# Patient Record
Sex: Male | Born: 1940 | Race: White | Hispanic: No | Marital: Married | Smoking: Former smoker
Health system: Southern US, Community
[De-identification: ages and names within clinical notes are randomized; demographics above are authoritative.]

## PROBLEM LIST (undated history)

## (undated) DIAGNOSIS — F319 Bipolar disorder, unspecified: Secondary | ICD-10-CM

## (undated) DIAGNOSIS — I739 Peripheral vascular disease, unspecified: Secondary | ICD-10-CM

## (undated) DIAGNOSIS — R7303 Prediabetes: Secondary | ICD-10-CM

## (undated) DIAGNOSIS — E785 Hyperlipidemia, unspecified: Secondary | ICD-10-CM

## (undated) DIAGNOSIS — N183 Chronic kidney disease, stage 3 unspecified: Secondary | ICD-10-CM

## (undated) HISTORY — PX: VASCULAR SURGERY: SHX849

---

## 2004-10-10 ENCOUNTER — Ambulatory Visit: Payer: Self-pay | Admitting: Surgery

## 2009-12-04 ENCOUNTER — Ambulatory Visit: Payer: Self-pay | Admitting: Vascular Surgery

## 2009-12-04 IMAGING — XA IR VASCULAR PROCEDURE
14 of 22 series · 15 of 24 positions shown · IV contrast (IODINE)
Comparison: none

[Series 1: aorta · 1 of 2 slices shown (1 of 2)]
[im 1/2]
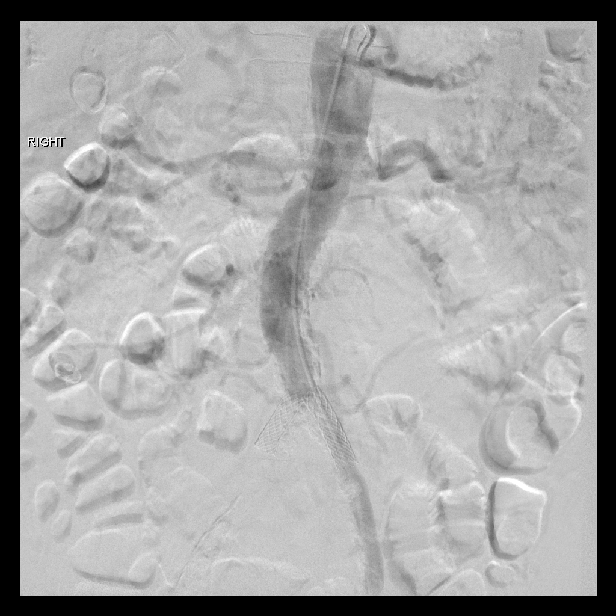

[Series 3: aorta · 1 of 2 slices shown (2 of 2)]
[im 1/2]
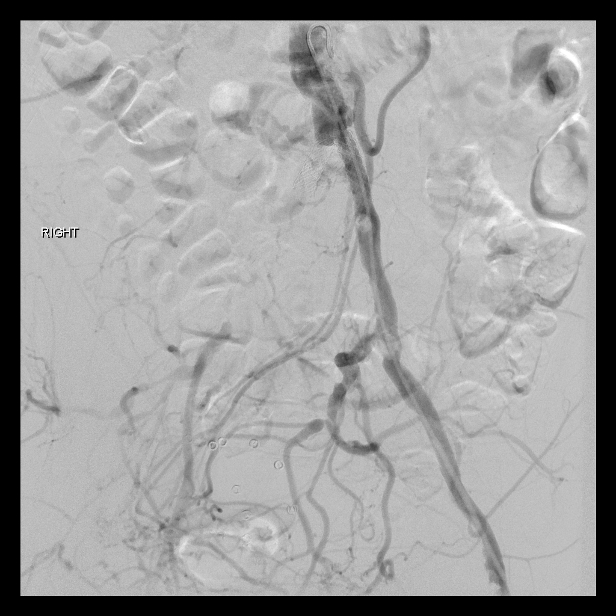

[Series 5: sfa · 1 of 2 slices shown (1 of 2)]
[im 1/2]
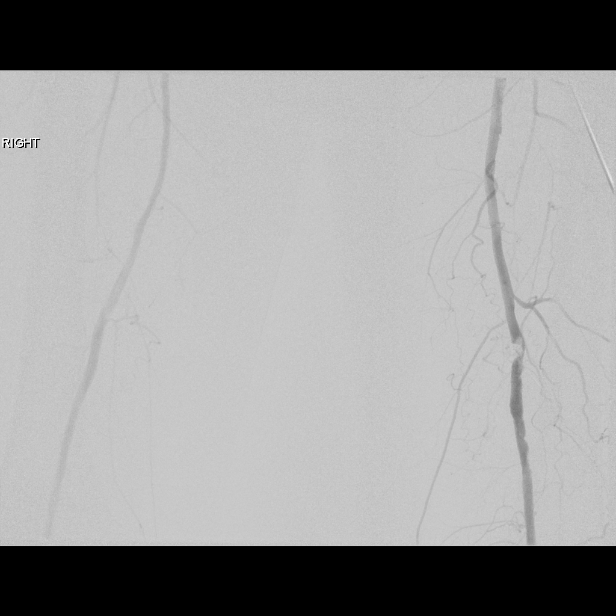

[Series 6: sfa · 1 of 2 slices shown (2 of 2)]
[im 1/2]
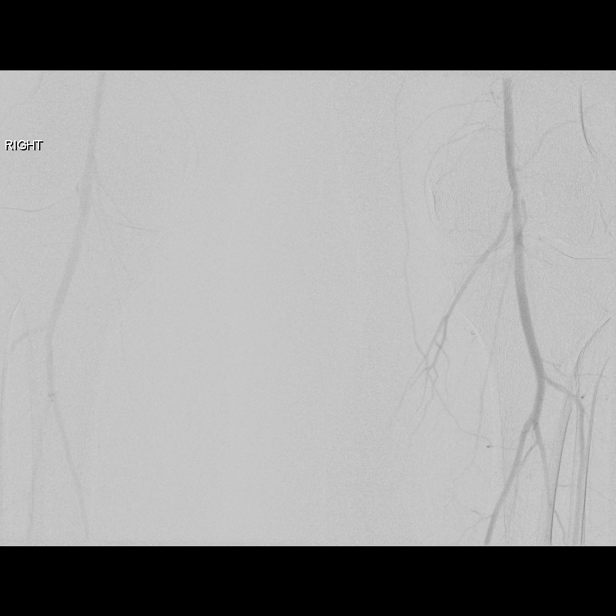

[Series 8: iliacs · 1 of 2 slices shown (1 of 8)]
[im 1/2]
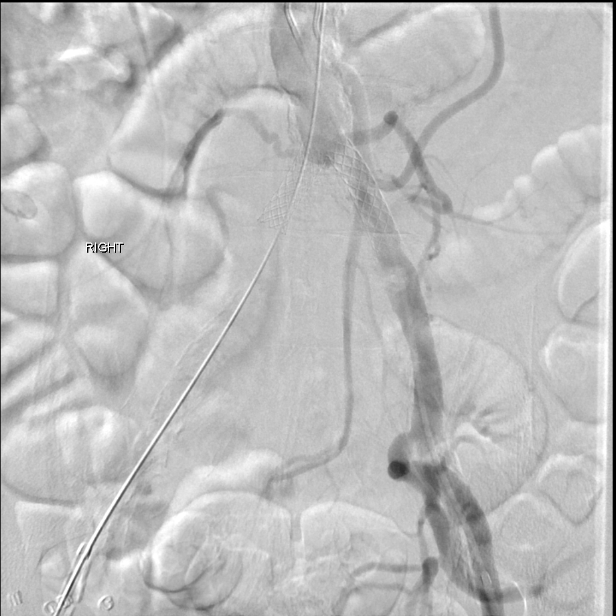

[Series 9: iliacs · 1 of 2 slices shown (2 of 8)]
[im 1/2]
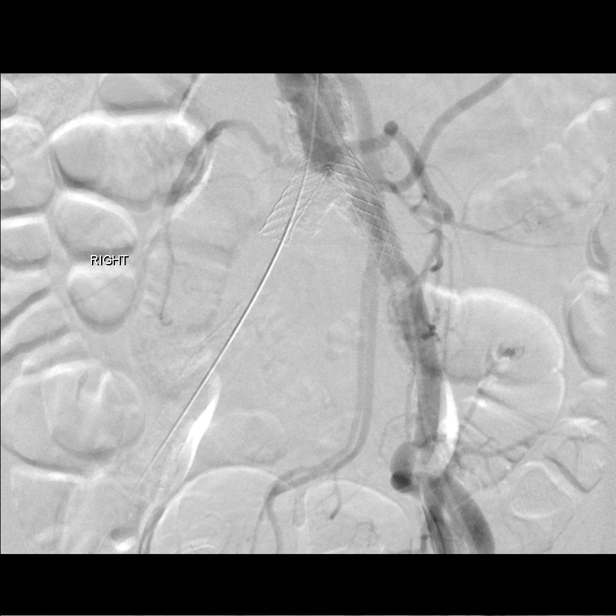

[Series 11: fl  angio · 1 of 1 slices shown (1 of 2)]
[im 1/1]
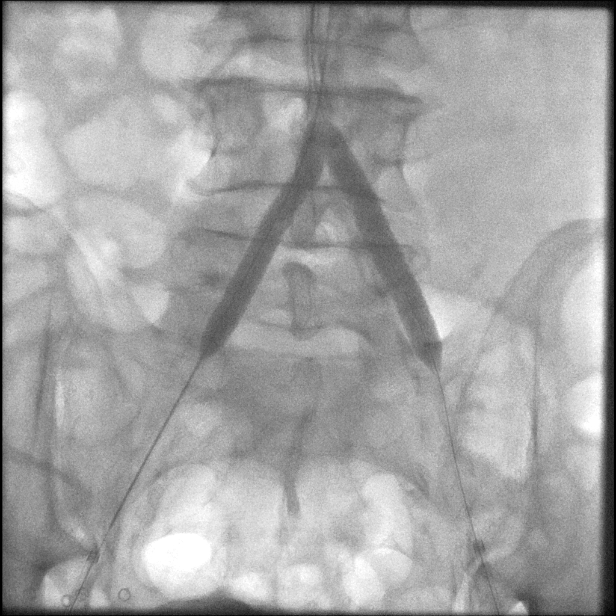

[Series 12: iliacs · 1 of 3 slices shown (3 of 8)]
[im 3/3]
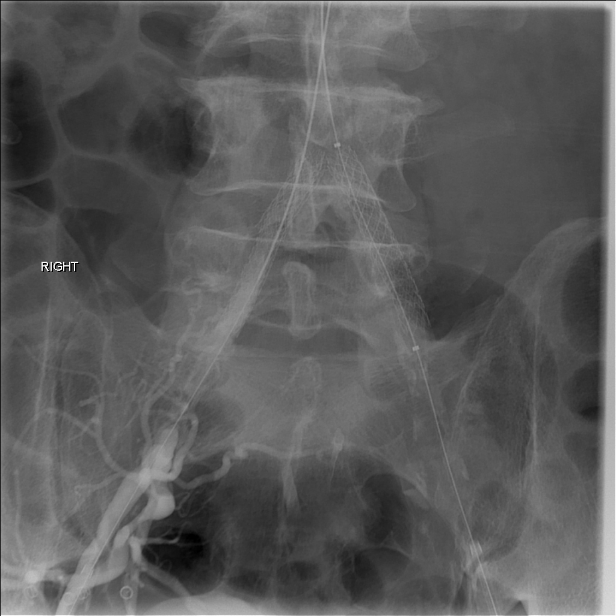

[Series 13: iliacs · 1 of 2 slices shown (4 of 8)]
[im 1/2]
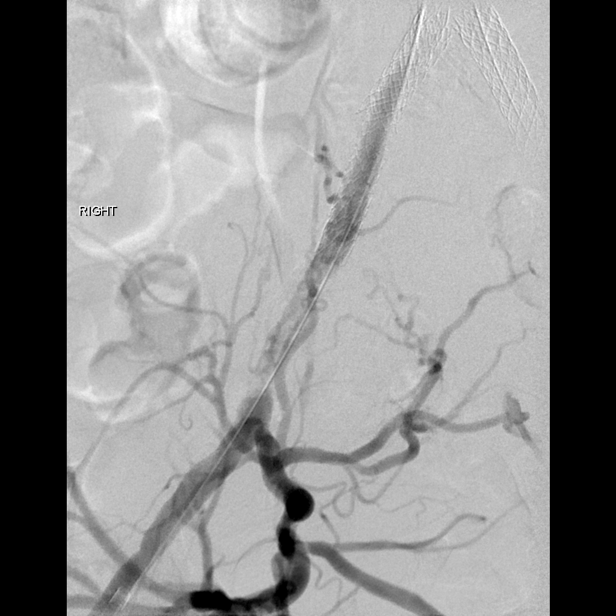

[Series 15: iliacs · 1 of 2 slices shown (5 of 8)]
[im 1/2]
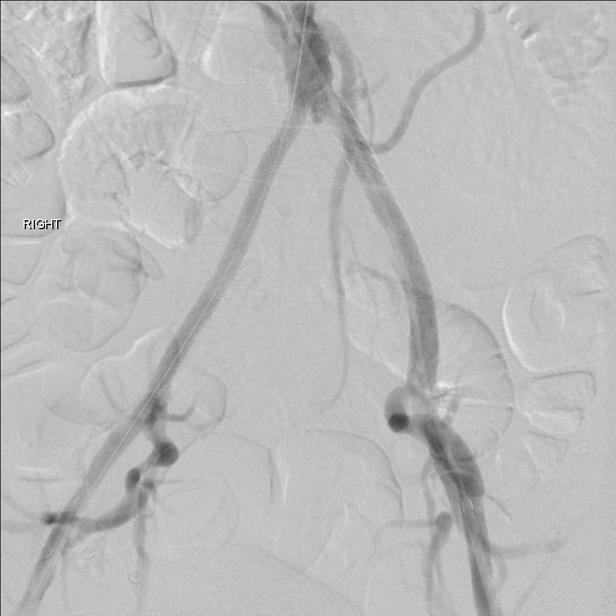

[Series 16: fl  angio · 1 of 1 slices shown (2 of 2)]
[im 1/1]
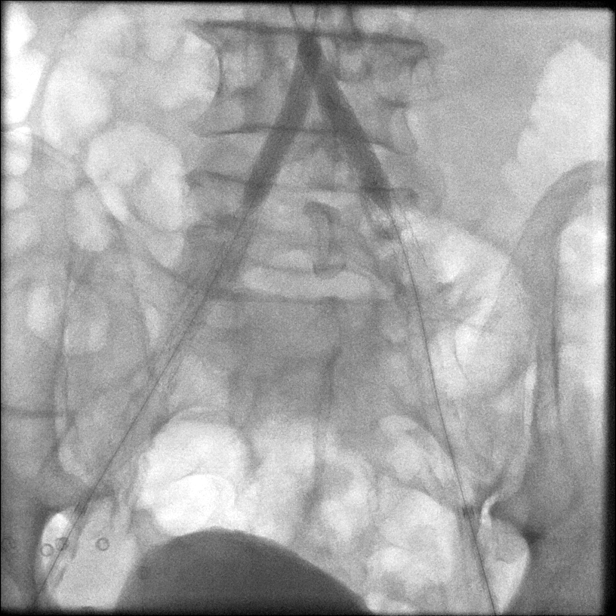

[Series 18: iliacs · 1 of 2 slices shown (6 of 8)]
[im 1/2]
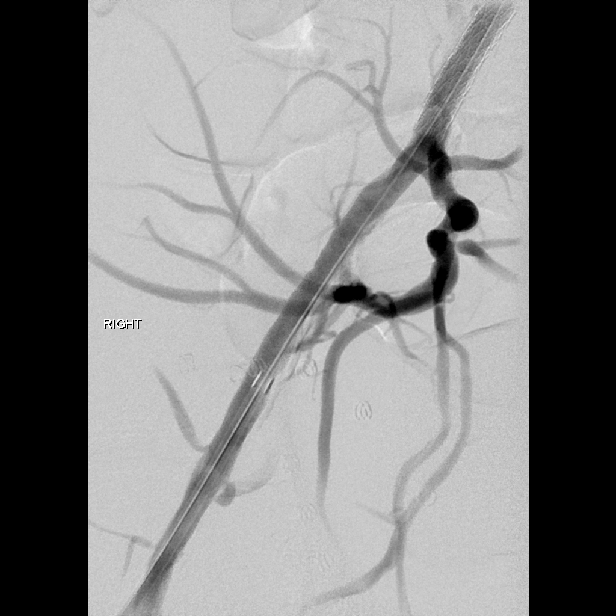

[Series 20: iliacs · 2 of 3 slices shown (7 of 8)]
[im 1/3]
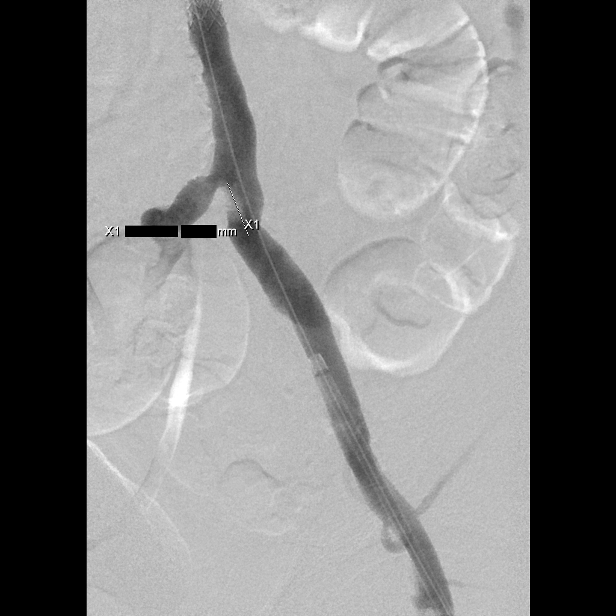
[im 3/3]
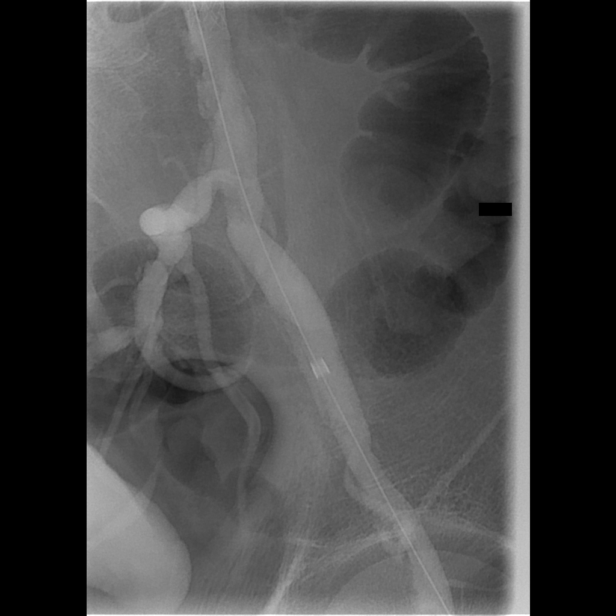

[Series 22: iliacs · 1 of 2 slices shown (8 of 8)]
[im 1/2]
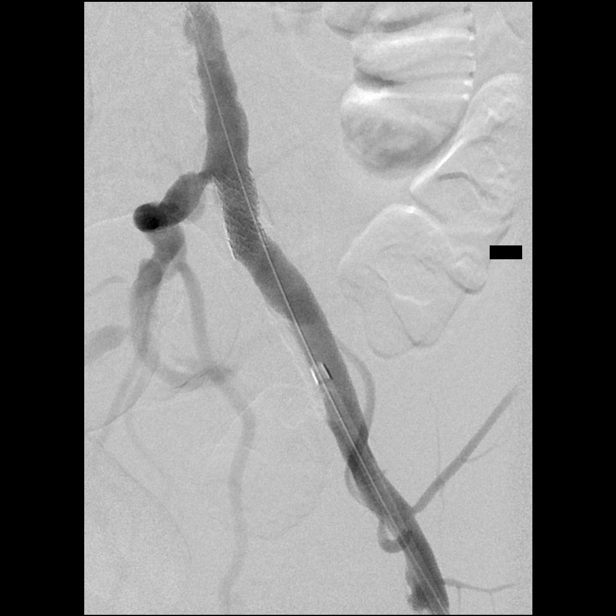

[15 of 24 positions shown; findings below may reference images not displayed]

IMAGES IMPORTED FROM THE [employer] WORKFLOW SYSTEM
NO DICTATION FOR STUDY

## 2009-12-31 ENCOUNTER — Ambulatory Visit: Payer: Self-pay | Admitting: Vascular Surgery

## 2009-12-31 IMAGING — XA IR VASCULAR PROCEDURE
11 series · 15 of 18 positions shown · IV contrast (IODINE)
Comparison: none

[Series 1: aorta · 2 of 2 slices shown (1 of 7)]
[im 1/2]
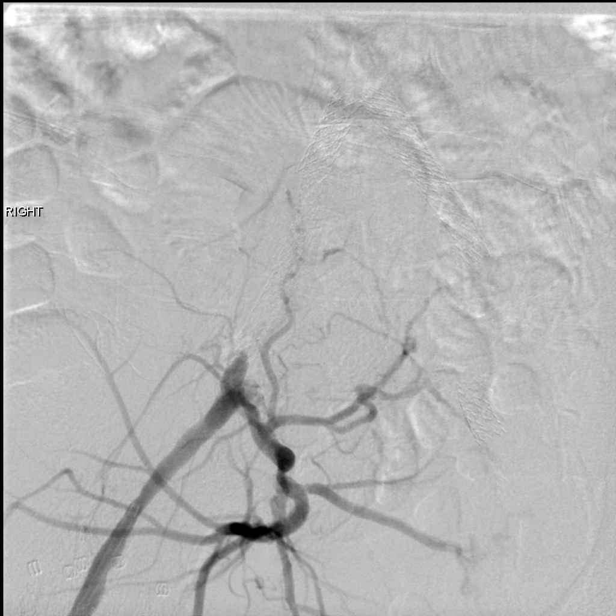
[im 2/2]
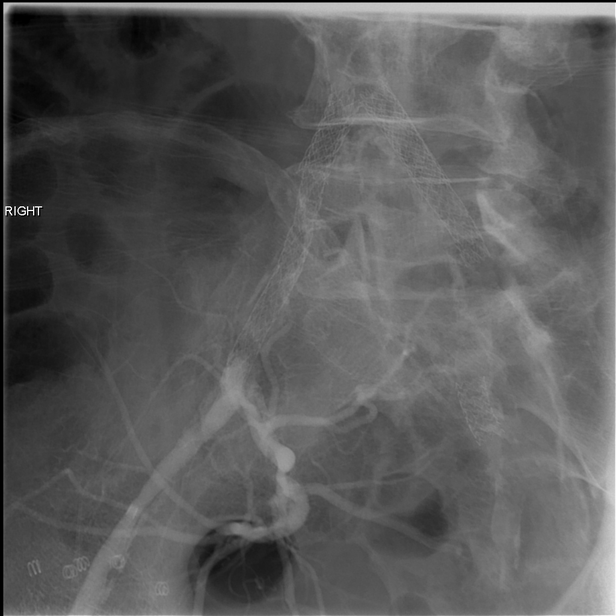

[Series 2: aorta · 1 of 2 slices shown (2 of 7)]
[im 2/2]
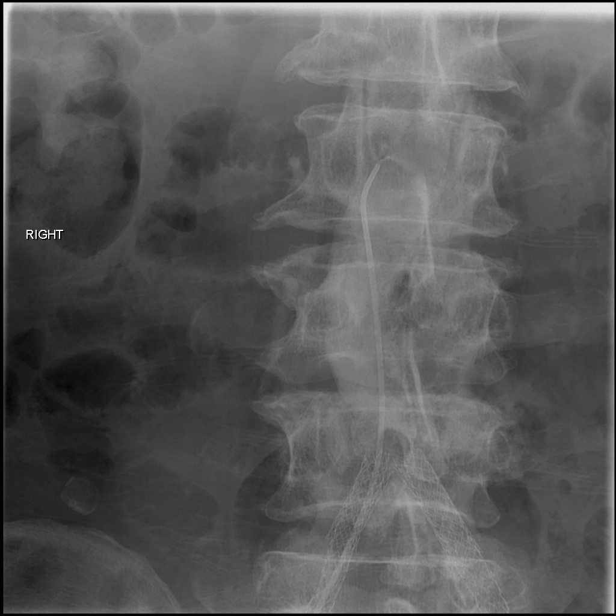

[Series 3: fl  angio · 1 of 1 slices shown (1 of 4)]
[im 1/1]
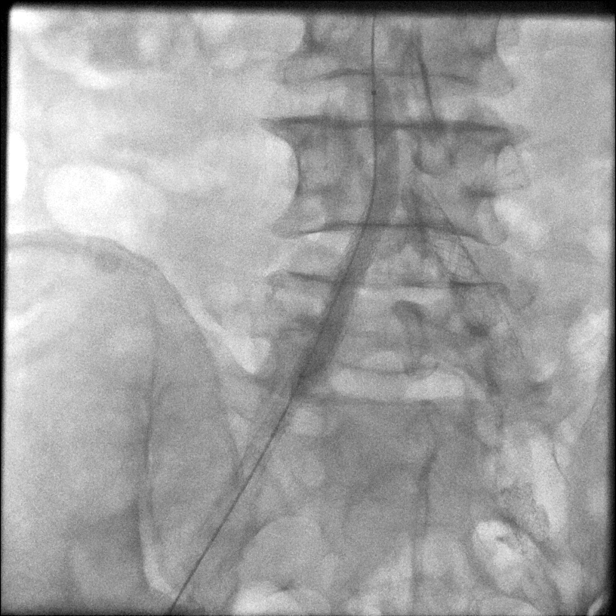

[Series 4: fl  angio · 1 of 1 slices shown (2 of 4)]
[im 1/1]
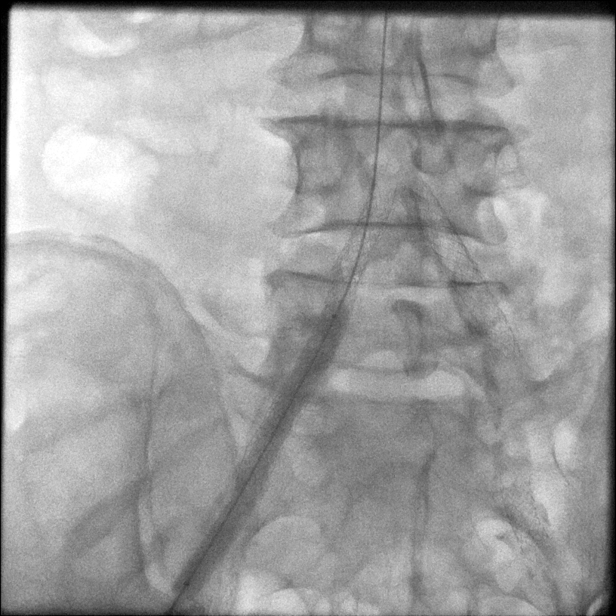

[Series 5: aorta · 2 of 2 slices shown (3 of 7)]
[im 1/2]
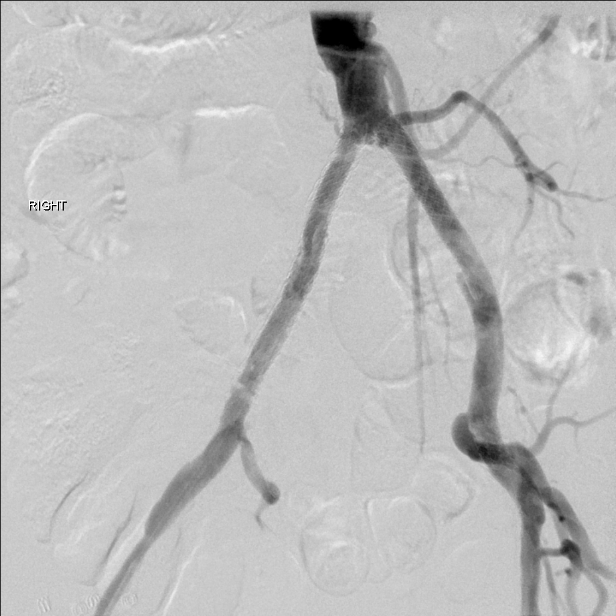
[im 2/2]
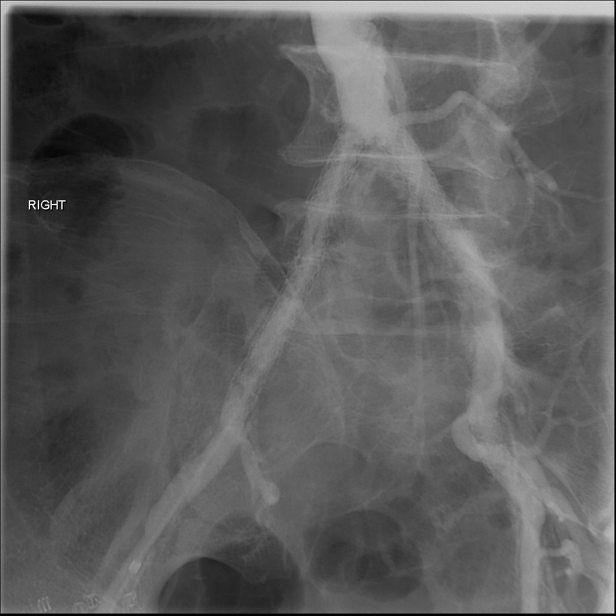

[Series 6: aorta · 1 of 2 slices shown (4 of 7)]
[im 2/2]
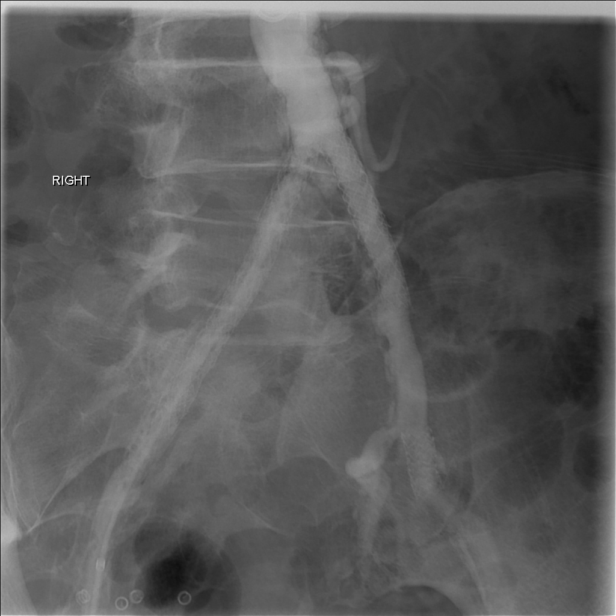

[Series 7: fl  angio · 1 of 1 slices shown (3 of 4)]
[im 1/1]
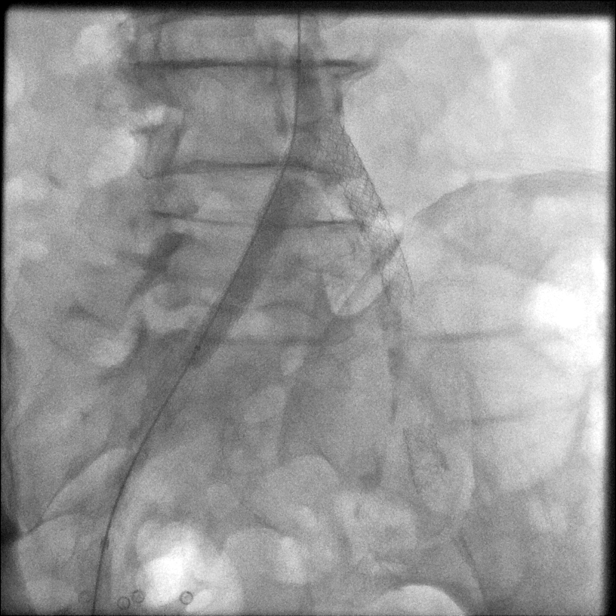

[Series 8: fl  angio · 1 of 1 slices shown (4 of 4)]
[im 1/1]
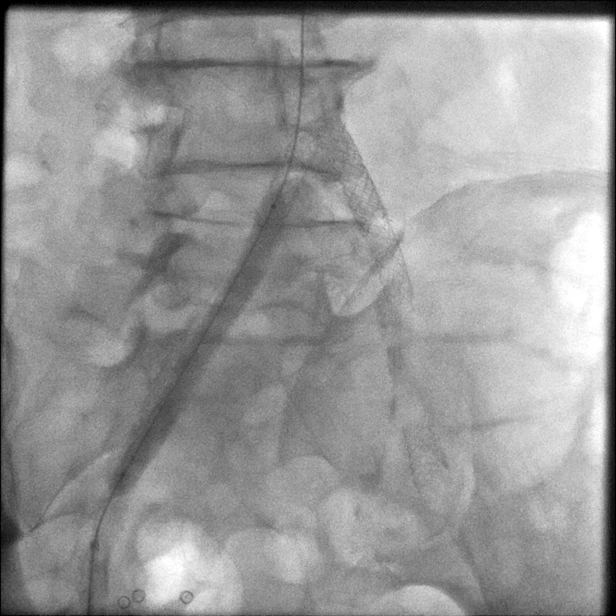

[Series 9: aorta · 2 of 2 slices shown (5 of 7)]
[im 1/2]
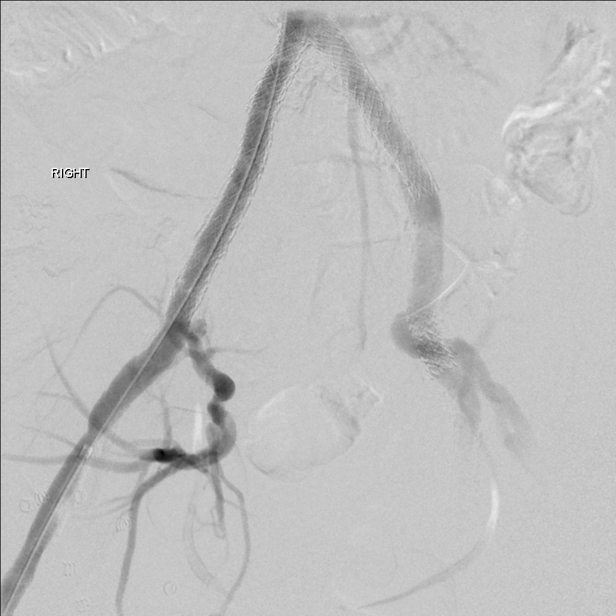
[im 2/2]
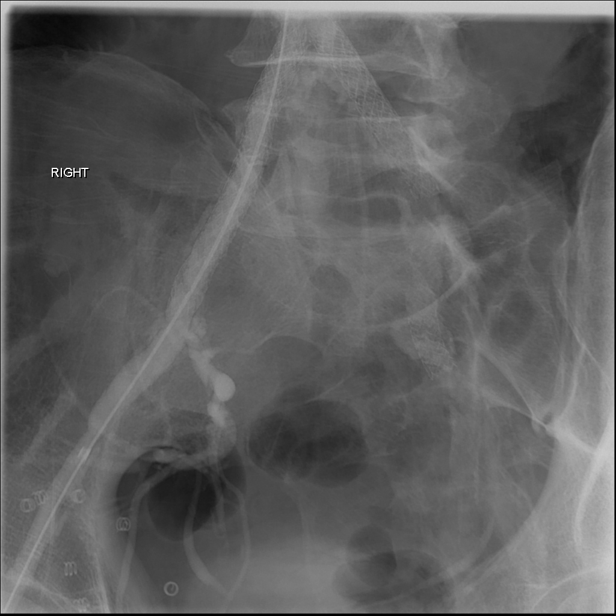

[Series 10: aorta · 1 of 2 slices shown (6 of 7)]
[im 2/2]
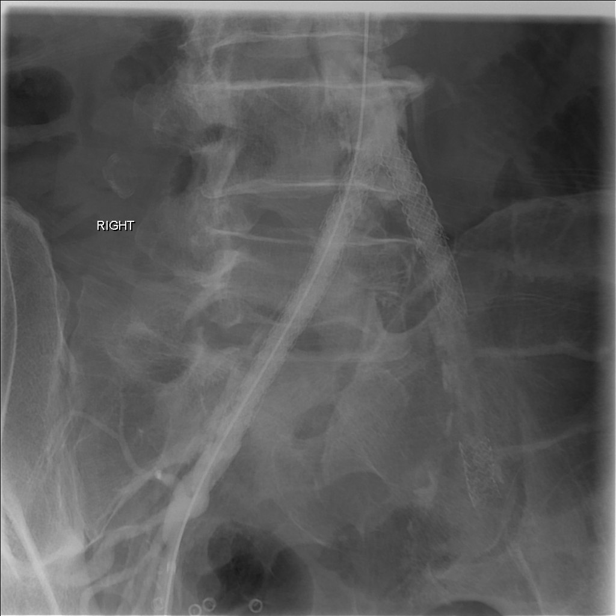

[Series 11: aorta · 2 of 2 slices shown (7 of 7)]
[im 1/2]
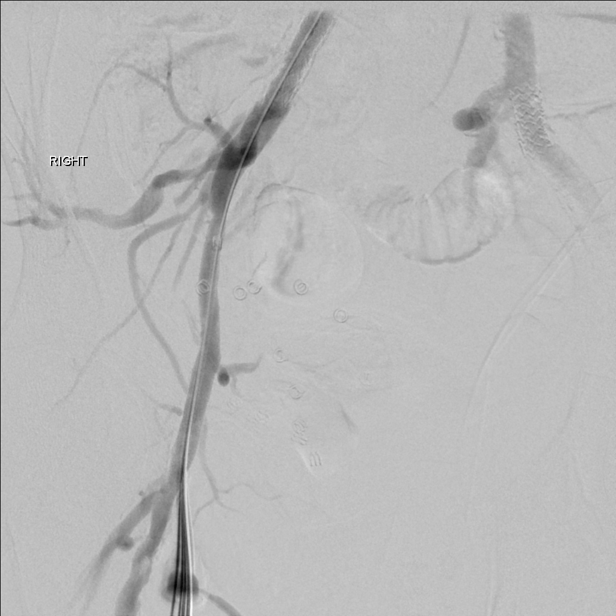
[im 2/2]
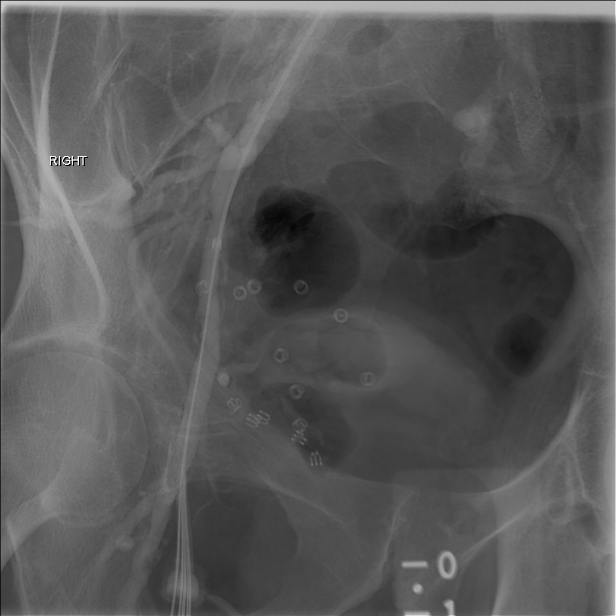

[15 of 18 positions shown; findings below may reference images not displayed]

IMAGES IMPORTED FROM THE [employer] WORKFLOW SYSTEM
NO DICTATION FOR STUDY

## 2010-01-09 ENCOUNTER — Ambulatory Visit: Payer: Self-pay | Admitting: Vascular Surgery

## 2010-01-09 IMAGING — CR DG CHEST 2V
1 series · 3 of 3 positions shown · non-contrast
Comparison: none

REASON FOR EXAM: dyspnea,smoker,pvd,stent
COMMENTS:

[Series 1: view not recorded · 0.17mm/px · 3 of 3 slices shown]
[im 1/3]
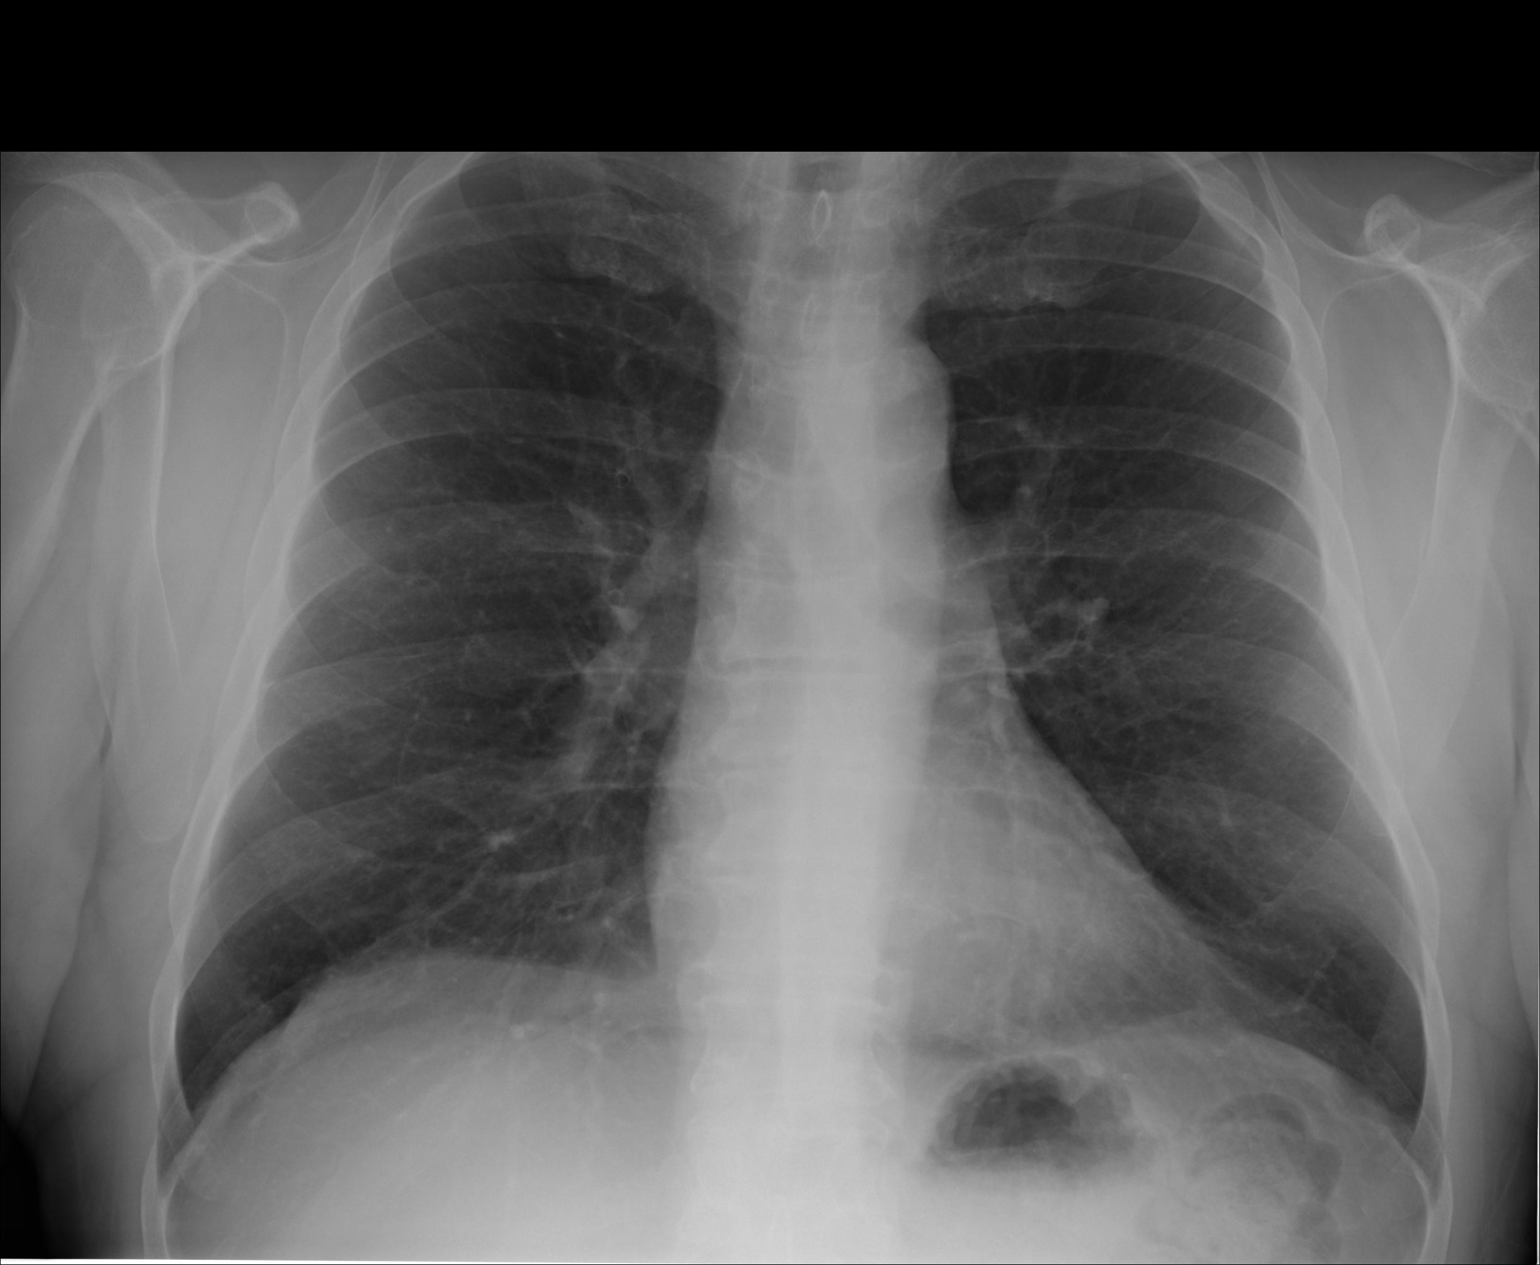
[im 2/3]
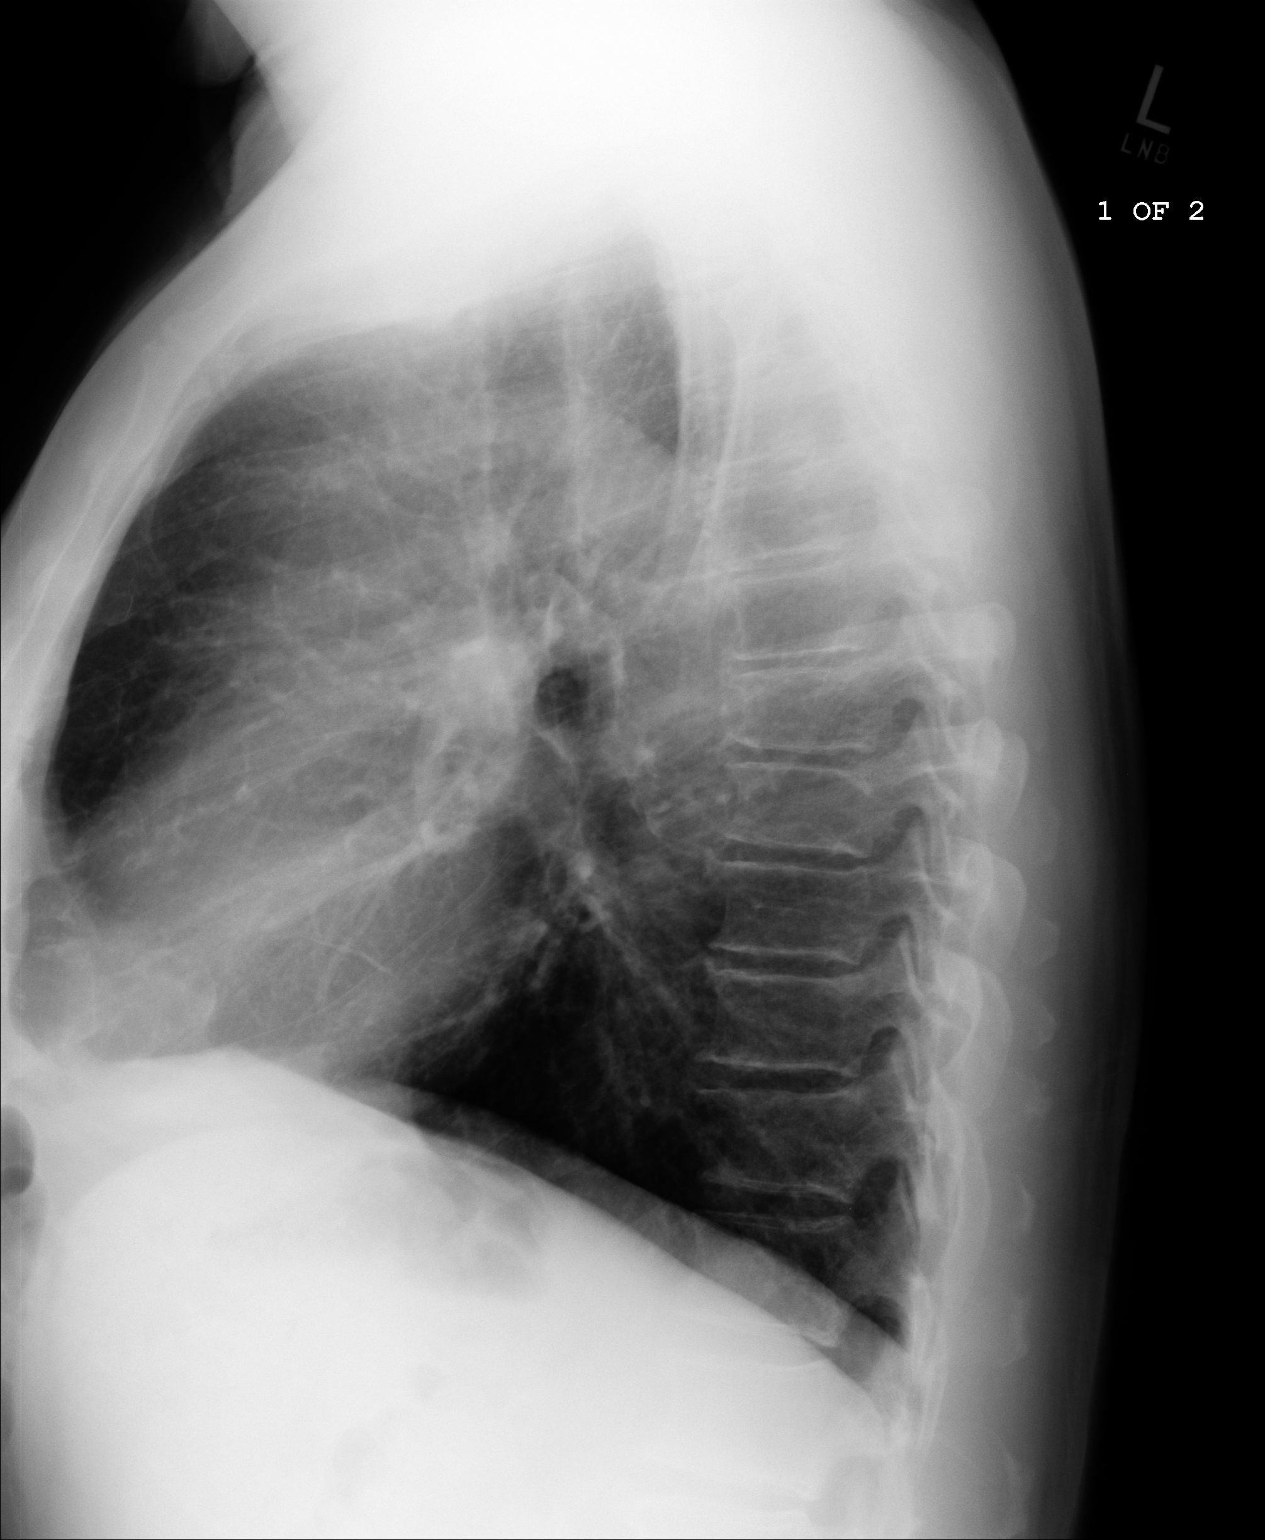
[im 3/3]
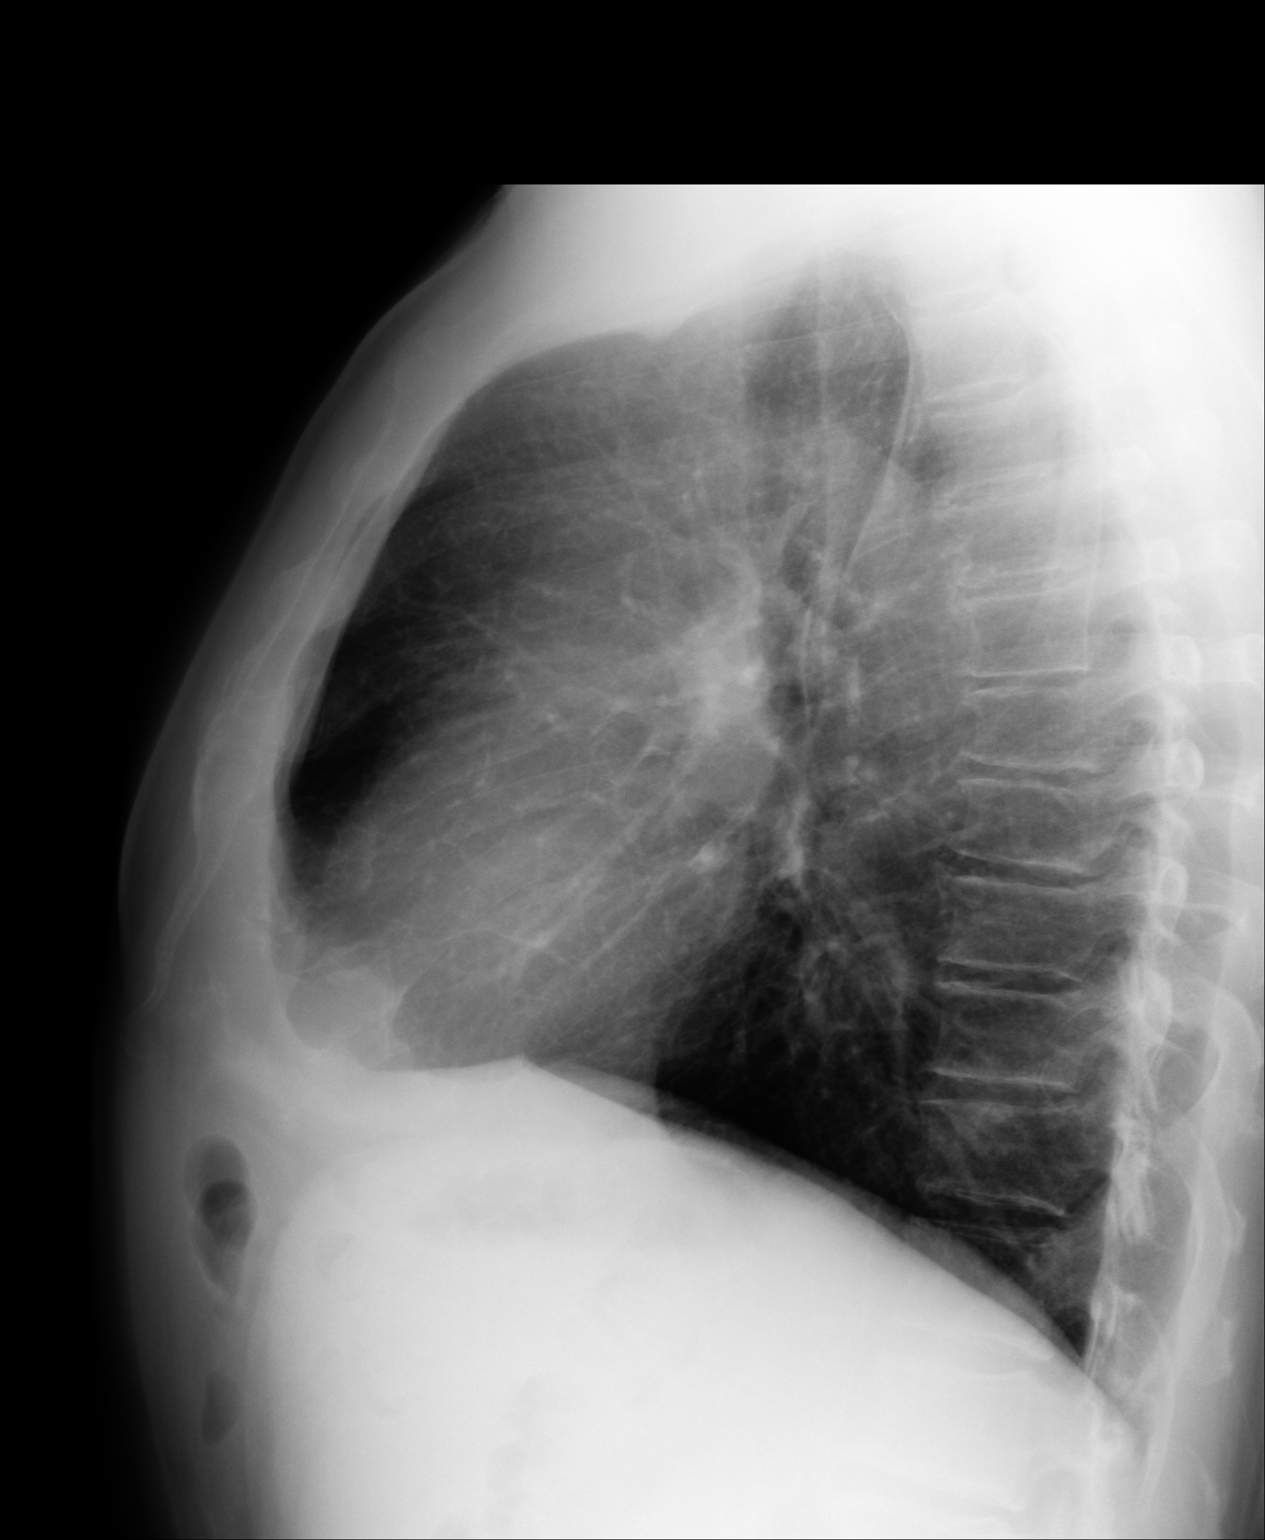

[3 of 3 positions shown; findings below may reference images not displayed]

PROCEDURE:     DXR - DXR CHEST PA (OR AP) AND LATERAL  - [DATE] [DATE]

RESULT:     The lung fields are clear. No pneumonia, pneumothorax or pleural
effusion is seen. The heart size is normal. In the lateral view, the chest
appears mildly hyperexpanded consistent with an element of COPD. The osseous
structures are normal in appearance.
IMPRESSION: 1. The lung fields are clear.
2. The chest appears mildly hyperexpanded.
3. Incidental note is made of an old healed fracture of the left clavicle.

## 2010-01-16 ENCOUNTER — Inpatient Hospital Stay: Payer: Self-pay | Admitting: Vascular Surgery

## 2010-03-20 ENCOUNTER — Emergency Department (HOSPITAL_COMMUNITY)
Admission: EM | Admit: 2010-03-20 | Discharge: 2010-03-20 | Payer: Self-pay | Source: Home / Self Care | Admitting: Emergency Medicine
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
## 2011-01-14 ENCOUNTER — Ambulatory Visit: Payer: Self-pay | Admitting: Gastroenterology

## 2014-06-26 DIAGNOSIS — I70229 Atherosclerosis of native arteries of extremities with rest pain, unspecified extremity: Secondary | ICD-10-CM | POA: Insufficient documentation

## 2014-06-26 DIAGNOSIS — E782 Mixed hyperlipidemia: Secondary | ICD-10-CM | POA: Insufficient documentation

## 2014-06-26 DIAGNOSIS — I739 Peripheral vascular disease, unspecified: Secondary | ICD-10-CM | POA: Insufficient documentation

## 2016-10-06 DIAGNOSIS — F319 Bipolar disorder, unspecified: Secondary | ICD-10-CM | POA: Insufficient documentation

## 2016-10-06 DIAGNOSIS — D369 Benign neoplasm, unspecified site: Secondary | ICD-10-CM | POA: Insufficient documentation

## 2017-03-13 DIAGNOSIS — F320 Major depressive disorder, single episode, mild: Secondary | ICD-10-CM | POA: Insufficient documentation

## 2017-04-07 DIAGNOSIS — Z0000 Encounter for general adult medical examination without abnormal findings: Secondary | ICD-10-CM | POA: Insufficient documentation

## 2018-04-14 DIAGNOSIS — J432 Centrilobular emphysema: Secondary | ICD-10-CM | POA: Insufficient documentation

## 2019-04-27 DIAGNOSIS — E1151 Type 2 diabetes mellitus with diabetic peripheral angiopathy without gangrene: Secondary | ICD-10-CM | POA: Insufficient documentation

## 2019-04-27 DIAGNOSIS — N183 Chronic kidney disease, stage 3 unspecified: Secondary | ICD-10-CM | POA: Insufficient documentation

## 2020-05-24 ENCOUNTER — Encounter (INDEPENDENT_AMBULATORY_CARE_PROVIDER_SITE_OTHER): Payer: Medicare Other | Admitting: Vascular Surgery

## 2020-10-15 ENCOUNTER — Encounter (INDEPENDENT_AMBULATORY_CARE_PROVIDER_SITE_OTHER): Payer: Medicare Other | Admitting: Vascular Surgery

## 2020-10-15 ENCOUNTER — Other Ambulatory Visit (INDEPENDENT_AMBULATORY_CARE_PROVIDER_SITE_OTHER): Payer: Self-pay | Admitting: Vascular Surgery

## 2020-10-15 DIAGNOSIS — I739 Peripheral vascular disease, unspecified: Secondary | ICD-10-CM

## 2020-10-16 ENCOUNTER — Ambulatory Visit (INDEPENDENT_AMBULATORY_CARE_PROVIDER_SITE_OTHER): Payer: Medicare Other | Admitting: Vascular Surgery

## 2020-10-16 ENCOUNTER — Other Ambulatory Visit: Payer: Self-pay

## 2020-10-16 ENCOUNTER — Telehealth (INDEPENDENT_AMBULATORY_CARE_PROVIDER_SITE_OTHER): Payer: Self-pay

## 2020-10-16 ENCOUNTER — Ambulatory Visit (INDEPENDENT_AMBULATORY_CARE_PROVIDER_SITE_OTHER): Payer: Medicare Other

## 2020-10-16 ENCOUNTER — Encounter (INDEPENDENT_AMBULATORY_CARE_PROVIDER_SITE_OTHER): Payer: Self-pay | Admitting: Vascular Surgery

## 2020-10-16 VITALS — BP 160/76 | HR 74 | Ht 71.0 in | Wt 227.0 lb

## 2020-10-16 DIAGNOSIS — N183 Chronic kidney disease, stage 3 unspecified: Secondary | ICD-10-CM | POA: Diagnosis not present

## 2020-10-16 DIAGNOSIS — E1151 Type 2 diabetes mellitus with diabetic peripheral angiopathy without gangrene: Secondary | ICD-10-CM

## 2020-10-16 DIAGNOSIS — I739 Peripheral vascular disease, unspecified: Secondary | ICD-10-CM

## 2020-10-16 DIAGNOSIS — I70219 Atherosclerosis of native arteries of extremities with intermittent claudication, unspecified extremity: Secondary | ICD-10-CM | POA: Insufficient documentation

## 2020-10-16 DIAGNOSIS — E782 Mixed hyperlipidemia: Secondary | ICD-10-CM

## 2020-10-16 DIAGNOSIS — I70213 Atherosclerosis of native arteries of extremities with intermittent claudication, bilateral legs: Secondary | ICD-10-CM

## 2020-10-16 NOTE — Assessment & Plan Note (Signed)
blood glucose control important in reducing the progression of atherosclerotic disease. Also, involved in wound healing. On appropriate medications.  

## 2020-10-16 NOTE — Patient Instructions (Signed)
Peripheral Vascular Disease  Peripheral vascular disease (PVD) is a disease of the blood vessels. PVD may also be called peripheral artery disease (PAD) or poor circulation. PVD is the blocking or hardening of the arteries anywhere within the circulatory system beyond the heart. This can result in a decreased supply of blood to the arms, legs, and internal organs, such as the stomach or kidneys. However, PVD most often affects a person's lower legs and feet. Without treatment, PVD often worsens. PVD can lead to acute limb ischemia. This occurs when an arm or leg suddenly has trouble getting enough blood. This is a medical emergency. What are the causes? The most common cause of PVD is atherosclerosis. This is a buildup of fatty material and other substances (plaque)inside your arteries. Pieces of plaque can break off from the walls of an artery and become stuck in a smaller artery, blocking blood flow and possibly causing acute limb ischemia. Other common causes of PVD include:  Blood clots that form inside the blood vessels.  Injuries to blood vessels.  Diseases that cause inflammation of blood vessels or cause blood vessel tightening (spasms). What increases the risk? The following factors may make you more likely to develop this condition:  A family history of PVD.  Common medical conditions, including: ? High cholesterol. ? Diabetes. ? High blood pressure (hypertension). ? Heart disease. ? Known atherosclerotic disease in another area of the body. ? Past injury, such as burns or a broken bone.  Other medical conditions, such as: ? Buerger's disease. This is caused by inflamed blood vessels in your hands and feet. ? Some forms of arthritis. ? Birth defects that affect the arteries in your legs. ? Kidney disease.  Using tobacco and nicotine products.  Not getting enough exercise.  Obesity.  Being age 65 or older, or being age 50 or older and having the other risk  factors. What are the signs or symptoms? This condition may cause different symptoms. Your symptoms depend on what body part is not getting enough blood. Common signs and symptoms include:  Cramps in your buttocks, legs, and feet.  Intermittent claudication. This is pain and weakness in your legs during activity that resolves with rest.  Leg pain at rest and leg numbness, tingling, or weakness.  Coldness in a leg or foot, especially when compared to the other leg or foot.  Skin or hair changes. These can include: ? Hair loss. ? Shiny skin. ? Pale or bluish skin. ? Thick toenails.  Inability to get or maintain an erection (erectile dysfunction).  Tiredness (fatigue).  Weak pulse or no pulse in the feet. People with PVD are more likely to develop open wounds (ulcers) and sores on their toes, feet, or legs. The ulcers or sores may take longer than normal to heal. How is this diagnosed? PVD is diagnosed based on your signs and symptoms, a physical exam, and your medical history. You may also have other tests to find the cause. Tests include:  Ankle-brachial index test.This test compares the blood pressure readings of the legs and arms. ? This may also include an exercise ankle-brachial index test in which you walk on a treadmill to check your symptoms.  Doppler ultrasound. This takes pictures of blood flow through your blood vessels.  Imaging studies that use dye to show blood flow. These are: ? CT angiogram. ? Magnetic resonance angiogram, or MRA. How is this treated? Treatment for PVD depends on the cause of your condition, how severe your symptoms   are, and your age. Underlying causes need to be treated and controlled. These include long-term (chronic) conditions, such as diabetes, high cholesterol, and hypertension. Treatment may include:  Lifestyle changes, such as: ? Quitting tobacco use. ? Exercising regularly. ? Following a low-fat, low-cholesterol diet. ? Not drinking  alcohol.  Taking medicines, such as: ? Blood thinners to prevent blood clots. ? Medicines to improve blood flow. ? Medicines to improve cholesterol levels.  Procedures, such as: ? Angioplasty. This uses an inflated balloon to open a blocked artery and improve blood flow. ? Stent implant. This inserts a small mesh tube to keep a blocked artery open. ? Peripheral bypass surgery. This reroutes blood flow around a blocked artery. ? Surgery to remove dead tissue from an infected wound (debridement). ? Amputation. This is surgical removal of the affected limb. It may be necessary in cases of acute limb ischemia when medical or surgical treatments have not helped. Follow these instructions at home: Medicines  Take over-the-counter and prescription medicines only as told by your health care provider.  If you are taking blood thinners: ? Talk with your health care provider before you take any medicines that contain aspirin or NSAIDs, such as ibuprofen. These medicines increase your risk for dangerous bleeding. ? Take your medicine exactly as told, at the same time every day. ? Avoid activities that could cause injury or bruising, and follow instructions about how to prevent falls. ? Wear a medical alert bracelet or carry a card that lists what medicines you take. Lifestyle  Exercise regularly. Ask your health care provider about some good activities for you.  Talk with your health care provider about maintaining a healthy weight. If needed, ask about losing weight.  Eat a diet that is low in fat and cholesterol. If you need help, talk with your health care provider.  Do not drink alcohol.  Do not use any products that contain nicotine or tobacco. These products include cigarettes, chewing tobacco, and vaping devices, such as e-cigarettes. If you need help quitting, ask your health care provider.      General instructions  Take good care of your feet. To do this: ? Wear comfortable shoes  that fit well. ? Check your feet often for any cuts or sores.  Get an annual influenza vaccine.  Keep all follow-up visits. This is important. Where to find more information  Society for Vascular Surgery: vascular.org  American Heart Association: heart.org  National Heart, Lung, and Blood Institute: nhlbi.nih.gov Contact a health care provider if:  You have leg cramps while walking.  You have leg pain when you rest.  Your leg or foot feels cold.  Your skin changes color.  You have erectile dysfunction.  You have cuts or sores on your legs or feet that do not heal. Get help right away if:  You have sudden changes in color and feeling of your arms or legs, such as: ? Your arm or leg turns cold, numb, and blue. ? Your arm or leg becomes red, warm, swollen, painful, or numb.  You have any symptoms of a stroke. "BE FAST" is an easy way to remember the main warning signs of a stroke: ? B - Balance. Signs are dizziness, sudden trouble walking, or loss of balance. ? E - Eyes. Signs are trouble seeing or a sudden change in vision. ? F - Face. Signs are sudden weakness or numbness of the face, or the face or eyelid drooping on one side. ? A - Arms. Signs   are weakness or numbness in an arm. This happens suddenly and usually on one side of the body. ? S - Speech. Signs are sudden trouble speaking, slurred speech, or trouble understanding what people say. ? T - Time. Time to call emergency services. Write down what time symptoms started.  You have other signs of a stroke, such as: ? A sudden, severe headache with no known cause. ? Nausea or vomiting. ? Seizure.  You have chest pain or trouble breathing. These symptoms may represent a serious problem that is an emergency. Do not wait to see if the symptoms will go away. Get medical help right away. Call your local emergency services (911 in the [place] Do not drive yourself to the hospital. Summary  Peripheral vascular disease (PVD)  is a disease of the blood vessels.  PVD is the blocking or hardening of the arteries anywhere within the circulatory system beyond the heart.  PVD may cause different symptoms. Your symptoms depend on what part of your body is not getting enough blood.  Treatment for PVD depends on what caused it, how severe your symptoms are, and your age. This information is not intended to replace advice given to you by your health care provider. Make sure you discuss any questions you have with your health care provider. Document Revised: 03/05/2020 Document Reviewed: 03/05/2020 [employer] Patient Education  2021 [employer]  

## 2020-10-16 NOTE — H&P (View-Only) (Signed)
Patient ID: Keith Levy, male   DOB: July 26, 1941, 80 y.o.   MRN: [id number]  Chief Complaint  Patient presents with  . New Patient (Initial Visit)  . PAD    U/S    HPI Keith Levy is a 80 y.o. male.  I am asked to see the patient by Dr. Sabra Heck for evaluation of PAD.  The patient has disabling symptoms in his legs now and can basically only walk a few feet.  He has to stop and rest and has to sit frequently.  He is a very poor historian and his daughter provides much of the history.  Both legs are affected about the same.  No open ulcerations or infection.  The pain does not necessarily wake him at night, but it does hurt when he is laying flat sometimes.  He denies fevers or chills.  This has been gradually worsening over several years but over the past year or 2 it is getting more severe.  He has had previous interventions to both legs a decade or so ago although these records are not immediately available.  His primary care physician noted his previous history of PAD and his difficulties with his legs and referred him to [place].  He was found to have ABIs of 0.59 on the right and 0.66 on the left with monophasic waveforms bilaterally.  His daughter also reports that he has previous history of DVTs and was on blood thinners for some time in the past.  She notes that his legs have become very congested and purplish particularly in the feet and ankles.     Past Medical History PAD Bipolar disorder Hyperlipidemia Diabetes  Past Surgical History Previous lower extremity interventions bilaterally with one stent on the left and 2 on the right  Family History No bleeding disorders, clotting disorders, autoimmune diseases, or aneurysms.   Social History   Tobacco Use  . Smoking status: Current Some Day Smoker  . Smokeless tobacco: Never Used  Retired Lives alone  Not on File  Current Outpatient Medications  Medication Sig Dispense Refill  . aspirin 81 MG EC tablet Take by  mouth.    . lithium carbonate 300 MG capsule Take 1 capsule by mouth 2 (two) times daily.    Marland Kitchen PARoxetine (PAXIL) 20 MG tablet Take by mouth.    . QUEtiapine (SEROQUEL) 50 MG tablet Take by mouth.    . simvastatin (ZOCOR) 20 MG tablet Take 1 tablet by mouth daily.     No current facility-administered medications for this visit.      REVIEW OF SYSTEMS (Negative unless checked)  Constitutional: [] Weight loss  [] Fever  [] Chills Cardiac: [] Chest pain   [] Chest pressure   [] Palpitations   [] Shortness of breath when laying flat   [] Shortness of breath at rest   [] Shortness of breath with exertion. Vascular:  [x] Pain in legs with walking   [x] Pain in legs at rest   [x] Pain in legs when laying flat   [] Claudication   [] Pain in feet when walking  [] Pain in feet at rest  [] Pain in feet when laying flat   [] History of DVT   [] Phlebitis   [] Swelling in legs   [] Varicose veins   [] Non-healing ulcers Pulmonary:   [] Uses home oxygen   [] Productive cough   [] Hemoptysis   [] Wheeze  [] COPD   [] Asthma Neurologic:  [] Dizziness  [] Blackouts   [] Seizures   [] History of stroke   [] History of TIA  [] Aphasia   [] Temporary blindness   []   Dysphagia   [] Weakness or numbness in arms   [x] Weakness or numbness in legs Musculoskeletal:  [x] Arthritis   [] Joint swelling   [] Joint pain   [] Low back pain Hematologic:  [] Easy bruising  [] Easy bleeding   [] Hypercoagulable state   [] Anemic  [] Hepatitis Gastrointestinal:  [] Blood in stool   [] Vomiting blood  [] Gastroesophageal reflux/heartburn   [] Abdominal pain Genitourinary:  [] Chronic kidney disease   [] Difficult urination  [] Frequent urination  [] Burning with urination   [] Hematuria Skin:  [] Rashes   [] Ulcers   [] Wounds Psychological:  [x] History of anxiety   [x]  History of major depression.    Physical Exam BP (!) 160/76   Pulse 74   Ht 5\' 11"  (1.803 m)   Wt 227 lb (103 kg)   BMI 31.66 kg/m  Gen:  WD/WN, NAD. Somewhat disheveled Head: [place]/AT, No temporalis wasting.   Ear/Nose/Throat: Hearing grossly intact, nares w/o erythema or drainage, oropharynx w/o Erythema/Exudate Eyes: Conjunctiva clear, sclera non-icteric  Neck: trachea midline.  No JVD.  Pulmonary:  Good air movement, respirations not labored, no use of accessory muscles  Cardiac: RRR, no JVD Vascular:  Vessel Right Left  Radial Palpable Palpable                          DP NP 1+  PT NP NP   Gastrointestinal:. No masses, surgical incisions, or scars. Musculoskeletal: M/S 5/5 throughout.  Extremities without ischemic changes.  No deformity or atrophy. Mild LE edema.  Capillary refill is somewhat sluggish in both feet Neurologic: Sensation grossly intact in extremities.  Symmetrical.  Speech is fluent. Motor exam as listed above. Psychiatric: Judgment intact, Mood & affect appropriate for pt's clinical situation. Dermatologic: No rashes or ulcers noted.  No cellulitis or open wounds.    Radiology ABI WITH/WO TBI  Result Date: 10/16/2020 LOWER EXTREMITY DOPPLER STUDY Indications: Claudication.  Performing Technologist: Charlane Ferretti RT (R)(VS)  Examination Guidelines: A complete evaluation includes at minimum, Doppler waveform signals and systolic blood pressure reading at the level of bilateral brachial, anterior tibial, and posterior tibial arteries, when vessel segments are accessible. Bilateral testing is considered an integral part of a complete examination. Photoelectric Plethysmograph (PPG) waveforms and toe systolic pressure readings are included as required and additional duplex testing as needed. Limited examinations for reoccurring indications may be performed as noted.  ABI Findings: +---------+------------------+-----+----------+--------+ Right    Rt Pressure (mmHg)IndexWaveform  Comment  +---------+------------------+-----+----------+--------+ Brachial 169                                       +---------+------------------+-----+----------+--------+ ATA      97                 0.57 monophasic         +---------+------------------+-----+----------+--------+ PTA      101               0.59 monophasic         +---------+------------------+-----+----------+--------+ Great Toe71                0.42 Abnormal           +---------+------------------+-----+----------+--------+ +---------+------------------+-----+----------+-------+ Left     Lt Pressure (mmHg)IndexWaveform  Comment +---------+------------------+-----+----------+-------+ Brachial 171                                      +---------+------------------+-----+----------+-------+  ATA      112               0.65 monophasic        +---------+------------------+-----+----------+-------+ PTA      113               0.66 monophasic        +---------+------------------+-----+----------+-------+ Great Toe110               0.64 Abnormal          +---------+------------------+-----+----------+-------+ Summary: Right: Resting right ankle-brachial index indicates moderate right lower extremity arterial disease. The right toe-brachial index is abnormal. Left: Resting left ankle-brachial index indicates moderate left lower extremity arterial disease. The left toe-brachial index is abnormal. *See table(s) above for measurements and observations.  Electronically signed by [name] MD on 10/16/2020 at 11:51:16 AM.   Final     Labs No results found for this or any previous visit (from the past 2160 hour(s)).  Assessment/Plan:  Atherosclerosis of native arteries of extremity with intermittent claudication (HCC) Noninvasive studies were performed today to evaluate his perfusion. He was found to have ABIs of 0.59 on the right and 0.66 on the left with monophasic waveforms bilaterally.  Recommend:  The patient has experienced increased symptoms and is now describing lifestyle limiting claudication and mild rest pain.   Given the severity of the patient's lower extremity symptoms the  patient should undergo angiography and intervention.  Risk and benefits were reviewed the patient.  Indications for the procedure were reviewed.  All questions were answered, the patient agrees to proceed.   The patient should continue walking and begin a more formal exercise program.  The patient should continue antiplatelet therapy and aggressive treatment of the lipid abnormalities  The patient will follow up with me after the angiogram.   We discussed that to treat both lower extremities and less he had significant aortoiliac disease, 2 separate interventions will be required.  Given the fact that his symptoms are roughly equal, either leg can be done first.  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 Also, involved in wound healing. On appropriate medications.   CKD (chronic kidney disease) stage 3, GFR 30-59 ml/min (HCC) Hydrate and limit contrast with the procedure.  Combined hyperlipidemia lipid control important in reducing the progression of atherosclerotic disease. Continue statin therapy       [name] 10/16/2020, 3:13 PM   This note was created with Dragon medical transcription system.  Any errors from dictation are unintentional.

## 2020-10-16 NOTE — Progress Notes (Signed)
Patient ID: Keith Levy, male   DOB: July 26, 1941, 80 y.o.   MRN: [id number]  Chief Complaint  Patient presents with  . New Patient (Initial Visit)  . PAD    U/S    HPI Keith Levy is a 80 y.o. male.  I am asked to see the patient by Dr. Sabra Heck for evaluation of PAD.  The patient has disabling symptoms in his legs now and can basically only walk a few feet.  He has to stop and rest and has to sit frequently.  He is a very poor historian and his daughter provides much of the history.  Both legs are affected about the same.  No open ulcerations or infection.  The pain does not necessarily wake him at night, but it does hurt when he is laying flat sometimes.  He denies fevers or chills.  This has been gradually worsening over several years but over the past year or 2 it is getting more severe.  He has had previous interventions to both legs a decade or so ago although these records are not immediately available.  His primary care physician noted his previous history of PAD and his difficulties with his legs and referred him to [place].  He was found to have ABIs of 0.59 on the right and 0.66 on the left with monophasic waveforms bilaterally.  His daughter also reports that he has previous history of DVTs and was on blood thinners for some time in the past.  She notes that his legs have become very congested and purplish particularly in the feet and ankles.     Past Medical History PAD Bipolar disorder Hyperlipidemia Diabetes  Past Surgical History Previous lower extremity interventions bilaterally with one stent on the left and 2 on the right  Family History No bleeding disorders, clotting disorders, autoimmune diseases, or aneurysms.   Social History   Tobacco Use  . Smoking status: Current Some Day Smoker  . Smokeless tobacco: Never Used  Retired Lives alone  Not on File  Current Outpatient Medications  Medication Sig Dispense Refill  . aspirin 81 MG EC tablet Take by  mouth.    . lithium carbonate 300 MG capsule Take 1 capsule by mouth 2 (two) times daily.    Marland Kitchen PARoxetine (PAXIL) 20 MG tablet Take by mouth.    . QUEtiapine (SEROQUEL) 50 MG tablet Take by mouth.    . simvastatin (ZOCOR) 20 MG tablet Take 1 tablet by mouth daily.     No current facility-administered medications for this visit.      REVIEW OF SYSTEMS (Negative unless checked)  Constitutional: [] Weight loss  [] Fever  [] Chills Cardiac: [] Chest pain   [] Chest pressure   [] Palpitations   [] Shortness of breath when laying flat   [] Shortness of breath at rest   [] Shortness of breath with exertion. Vascular:  [x] Pain in legs with walking   [x] Pain in legs at rest   [x] Pain in legs when laying flat   [] Claudication   [] Pain in feet when walking  [] Pain in feet at rest  [] Pain in feet when laying flat   [] History of DVT   [] Phlebitis   [] Swelling in legs   [] Varicose veins   [] Non-healing ulcers Pulmonary:   [] Uses home oxygen   [] Productive cough   [] Hemoptysis   [] Wheeze  [] COPD   [] Asthma Neurologic:  [] Dizziness  [] Blackouts   [] Seizures   [] History of stroke   [] History of TIA  [] Aphasia   [] Temporary blindness   []   Dysphagia   [] Weakness or numbness in arms   [x] Weakness or numbness in legs Musculoskeletal:  [x] Arthritis   [] Joint swelling   [] Joint pain   [] Low back pain Hematologic:  [] Easy bruising  [] Easy bleeding   [] Hypercoagulable state   [] Anemic  [] Hepatitis Gastrointestinal:  [] Blood in stool   [] Vomiting blood  [] Gastroesophageal reflux/heartburn   [] Abdominal pain Genitourinary:  [] Chronic kidney disease   [] Difficult urination  [] Frequent urination  [] Burning with urination   [] Hematuria Skin:  [] Rashes   [] Ulcers   [] Wounds Psychological:  [x] History of anxiety   [x]  History of major depression.    Physical Exam BP (!) 160/76   Pulse 74   Ht 5\' 11"  (1.803 m)   Wt 227 lb (103 kg)   BMI 31.66 kg/m  Gen:  WD/WN, NAD. Somewhat disheveled Head: [place]/AT, No temporalis wasting.   Ear/Nose/Throat: Hearing grossly intact, nares w/o erythema or drainage, oropharynx w/o Erythema/Exudate Eyes: Conjunctiva clear, sclera non-icteric  Neck: trachea midline.  No JVD.  Pulmonary:  Good air movement, respirations not labored, no use of accessory muscles  Cardiac: RRR, no JVD Vascular:  Vessel Right Left  Radial Palpable Palpable                          DP NP 1+  PT NP NP   Gastrointestinal:. No masses, surgical incisions, or scars. Musculoskeletal: M/S 5/5 throughout.  Extremities without ischemic changes.  No deformity or atrophy. Mild LE edema.  Capillary refill is somewhat sluggish in both feet Neurologic: Sensation grossly intact in extremities.  Symmetrical.  Speech is fluent. Motor exam as listed above. Psychiatric: Judgment intact, Mood & affect appropriate for pt's clinical situation. Dermatologic: No rashes or ulcers noted.  No cellulitis or open wounds.    Radiology ABI WITH/WO TBI  Result Date: 10/16/2020 LOWER EXTREMITY DOPPLER STUDY Indications: Claudication.  Performing Technologist: Charlane Ferretti RT (R)(VS)  Examination Guidelines: A complete evaluation includes at minimum, Doppler waveform signals and systolic blood pressure reading at the level of bilateral brachial, anterior tibial, and posterior tibial arteries, when vessel segments are accessible. Bilateral testing is considered an integral part of a complete examination. Photoelectric Plethysmograph (PPG) waveforms and toe systolic pressure readings are included as required and additional duplex testing as needed. Limited examinations for reoccurring indications may be performed as noted.  ABI Findings: +---------+------------------+-----+----------+--------+ Right    Rt Pressure (mmHg)IndexWaveform  Comment  +---------+------------------+-----+----------+--------+ Brachial 169                                       +---------+------------------+-----+----------+--------+ ATA      97                 0.57 monophasic         +---------+------------------+-----+----------+--------+ PTA      101               0.59 monophasic         +---------+------------------+-----+----------+--------+ Great Toe71                0.42 Abnormal           +---------+------------------+-----+----------+--------+ +---------+------------------+-----+----------+-------+ Left     Lt Pressure (mmHg)IndexWaveform  Comment +---------+------------------+-----+----------+-------+ Brachial 171                                      +---------+------------------+-----+----------+-------+  ATA      112               0.65 monophasic        +---------+------------------+-----+----------+-------+ PTA      113               0.66 monophasic        +---------+------------------+-----+----------+-------+ Great Toe110               0.64 Abnormal          +---------+------------------+-----+----------+-------+ Summary: Right: Resting right ankle-brachial index indicates moderate right lower extremity arterial disease. The right toe-brachial index is abnormal. Left: Resting left ankle-brachial index indicates moderate left lower extremity arterial disease. The left toe-brachial index is abnormal. *See table(s) above for measurements and observations.  Electronically signed by [name] MD on 10/16/2020 at 11:51:16 AM.   Final     Labs No results found for this or any previous visit (from the past 2160 hour(s)).  Assessment/Plan:  Atherosclerosis of native arteries of extremity with intermittent claudication (HCC) Noninvasive studies were performed today to evaluate his perfusion. He was found to have ABIs of 0.59 on the right and 0.66 on the left with monophasic waveforms bilaterally.  Recommend:  The patient has experienced increased symptoms and is now describing lifestyle limiting claudication and mild rest pain.   Given the severity of the patient's lower extremity symptoms the  patient should undergo angiography and intervention.  Risk and benefits were reviewed the patient.  Indications for the procedure were reviewed.  All questions were answered, the patient agrees to proceed.   The patient should continue walking and begin a more formal exercise program.  The patient should continue antiplatelet therapy and aggressive treatment of the lipid abnormalities  The patient will follow up with me after the angiogram.   We discussed that to treat both lower extremities and less he had significant aortoiliac disease, 2 separate interventions will be required.  Given the fact that his symptoms are roughly equal, either leg can be done first.  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 Also, involved in wound healing. On appropriate medications.   CKD (chronic kidney disease) stage 3, GFR 30-59 ml/min (HCC) Hydrate and limit contrast with the procedure.  Combined hyperlipidemia lipid control important in reducing the progression of atherosclerotic disease. Continue statin therapy       [name] 10/16/2020, 3:13 PM   This note was created with Dragon medical transcription system.  Any errors from dictation are unintentional.

## 2020-10-16 NOTE — Assessment & Plan Note (Signed)
Noninvasive studies were performed today to evaluate his perfusion. He was found to have ABIs of 0.59 on the right and 0.66 on the left with monophasic waveforms bilaterally.  Recommend:  The patient has experienced increased symptoms and is now describing lifestyle limiting claudication and mild rest pain.   Given the severity of the patient's lower extremity symptoms the patient should undergo angiography and intervention.  Risk and benefits were reviewed the patient.  Indications for the procedure were reviewed.  All questions were answered, the patient agrees to proceed.   The patient should continue walking and begin a more formal exercise program.  The patient should continue antiplatelet therapy and aggressive treatment of the lipid abnormalities  The patient will follow up with me after the angiogram.   We discussed that to treat both lower extremities and less he had significant aortoiliac disease, 2 separate interventions will be required.  Given the fact that his symptoms are roughly equal, either leg can be done first.

## 2020-10-16 NOTE — H&P (View-Only) (Signed)
Patient ID: Keith Levy, male   DOB: July 26, 1941, 80 y.o.   MRN: [id number]  Chief Complaint  Patient presents with  . New Patient (Initial Visit)  . PAD    U/S    HPI Keith Levy is a 80 y.o. male.  I am asked to see the patient by Dr. Sabra Heck for evaluation of PAD.  The patient has disabling symptoms in his legs now and can basically only walk a few feet.  He has to stop and rest and has to sit frequently.  He is a very poor historian and his daughter provides much of the history.  Both legs are affected about the same.  No open ulcerations or infection.  The pain does not necessarily wake him at night, but it does hurt when he is laying flat sometimes.  He denies fevers or chills.  This has been gradually worsening over several years but over the past year or 2 it is getting more severe.  He has had previous interventions to both legs a decade or so ago although these records are not immediately available.  His primary care physician noted his previous history of PAD and his difficulties with his legs and referred him to [place].  He was found to have ABIs of 0.59 on the right and 0.66 on the left with monophasic waveforms bilaterally.  His daughter also reports that he has previous history of DVTs and was on blood thinners for some time in the past.  She notes that his legs have become very congested and purplish particularly in the feet and ankles.     Past Medical History PAD Bipolar disorder Hyperlipidemia Diabetes  Past Surgical History Previous lower extremity interventions bilaterally with one stent on the left and 2 on the right  Family History No bleeding disorders, clotting disorders, autoimmune diseases, or aneurysms.   Social History   Tobacco Use  . Smoking status: Current Some Day Smoker  . Smokeless tobacco: Never Used  Retired Lives alone  Not on File  Current Outpatient Medications  Medication Sig Dispense Refill  . aspirin 81 MG EC tablet Take by  mouth.    . lithium carbonate 300 MG capsule Take 1 capsule by mouth 2 (two) times daily.    Marland Kitchen PARoxetine (PAXIL) 20 MG tablet Take by mouth.    . QUEtiapine (SEROQUEL) 50 MG tablet Take by mouth.    . simvastatin (ZOCOR) 20 MG tablet Take 1 tablet by mouth daily.     No current facility-administered medications for this visit.      REVIEW OF SYSTEMS (Negative unless checked)  Constitutional: [] Weight loss  [] Fever  [] Chills Cardiac: [] Chest pain   [] Chest pressure   [] Palpitations   [] Shortness of breath when laying flat   [] Shortness of breath at rest   [] Shortness of breath with exertion. Vascular:  [x] Pain in legs with walking   [x] Pain in legs at rest   [x] Pain in legs when laying flat   [] Claudication   [] Pain in feet when walking  [] Pain in feet at rest  [] Pain in feet when laying flat   [] History of DVT   [] Phlebitis   [] Swelling in legs   [] Varicose veins   [] Non-healing ulcers Pulmonary:   [] Uses home oxygen   [] Productive cough   [] Hemoptysis   [] Wheeze  [] COPD   [] Asthma Neurologic:  [] Dizziness  [] Blackouts   [] Seizures   [] History of stroke   [] History of TIA  [] Aphasia   [] Temporary blindness   []   Dysphagia   [] Weakness or numbness in arms   [x] Weakness or numbness in legs Musculoskeletal:  [x] Arthritis   [] Joint swelling   [] Joint pain   [] Low back pain Hematologic:  [] Easy bruising  [] Easy bleeding   [] Hypercoagulable state   [] Anemic  [] Hepatitis Gastrointestinal:  [] Blood in stool   [] Vomiting blood  [] Gastroesophageal reflux/heartburn   [] Abdominal pain Genitourinary:  [] Chronic kidney disease   [] Difficult urination  [] Frequent urination  [] Burning with urination   [] Hematuria Skin:  [] Rashes   [] Ulcers   [] Wounds Psychological:  [x] History of anxiety   [x]  History of major depression.    Physical Exam BP (!) 160/76   Pulse 74   Ht 5\' 11"  (1.803 m)   Wt 227 lb (103 kg)   BMI 31.66 kg/m  Gen:  WD/WN, NAD. Somewhat disheveled Head: [place]/AT, No temporalis wasting.   Ear/Nose/Throat: Hearing grossly intact, nares w/o erythema or drainage, oropharynx w/o Erythema/Exudate Eyes: Conjunctiva clear, sclera non-icteric  Neck: trachea midline.  No JVD.  Pulmonary:  Good air movement, respirations not labored, no use of accessory muscles  Cardiac: RRR, no JVD Vascular:  Vessel Right Left  Radial Palpable Palpable                          DP NP 1+  PT NP NP   Gastrointestinal:. No masses, surgical incisions, or scars. Musculoskeletal: M/S 5/5 throughout.  Extremities without ischemic changes.  No deformity or atrophy. Mild LE edema.  Capillary refill is somewhat sluggish in both feet Neurologic: Sensation grossly intact in extremities.  Symmetrical.  Speech is fluent. Motor exam as listed above. Psychiatric: Judgment intact, Mood & affect appropriate for pt's clinical situation. Dermatologic: No rashes or ulcers noted.  No cellulitis or open wounds.    Radiology ABI WITH/WO TBI  Result Date: 10/16/2020 LOWER EXTREMITY DOPPLER STUDY Indications: Claudication.  Performing Technologist: Charlane Ferretti RT (R)(VS)  Examination Guidelines: A complete evaluation includes at minimum, Doppler waveform signals and systolic blood pressure reading at the level of bilateral brachial, anterior tibial, and posterior tibial arteries, when vessel segments are accessible. Bilateral testing is considered an integral part of a complete examination. Photoelectric Plethysmograph (PPG) waveforms and toe systolic pressure readings are included as required and additional duplex testing as needed. Limited examinations for reoccurring indications may be performed as noted.  ABI Findings: +---------+------------------+-----+----------+--------+ Right    Rt Pressure (mmHg)IndexWaveform  Comment  +---------+------------------+-----+----------+--------+ Brachial 169                                       +---------+------------------+-----+----------+--------+ ATA      97                 0.57 monophasic         +---------+------------------+-----+----------+--------+ PTA      101               0.59 monophasic         +---------+------------------+-----+----------+--------+ Great Toe71                0.42 Abnormal           +---------+------------------+-----+----------+--------+ +---------+------------------+-----+----------+-------+ Left     Lt Pressure (mmHg)IndexWaveform  Comment +---------+------------------+-----+----------+-------+ Brachial 171                                      +---------+------------------+-----+----------+-------+  ATA      112               0.65 monophasic        +---------+------------------+-----+----------+-------+ PTA      113               0.66 monophasic        +---------+------------------+-----+----------+-------+ Great Toe110               0.64 Abnormal          +---------+------------------+-----+----------+-------+ Summary: Right: Resting right ankle-brachial index indicates moderate right lower extremity arterial disease. The right toe-brachial index is abnormal. Left: Resting left ankle-brachial index indicates moderate left lower extremity arterial disease. The left toe-brachial index is abnormal. *See table(s) above for measurements and observations.  Electronically signed by [name] MD on 10/16/2020 at 11:51:16 AM.   Final     Labs No results found for this or any previous visit (from the past 2160 hour(s)).  Assessment/Plan:  Atherosclerosis of native arteries of extremity with intermittent claudication (HCC) Noninvasive studies were performed today to evaluate his perfusion. He was found to have ABIs of 0.59 on the right and 0.66 on the left with monophasic waveforms bilaterally.  Recommend:  The patient has experienced increased symptoms and is now describing lifestyle limiting claudication and mild rest pain.   Given the severity of the patient's lower extremity symptoms the  patient should undergo angiography and intervention.  Risk and benefits were reviewed the patient.  Indications for the procedure were reviewed.  All questions were answered, the patient agrees to proceed.   The patient should continue walking and begin a more formal exercise program.  The patient should continue antiplatelet therapy and aggressive treatment of the lipid abnormalities  The patient will follow up with me after the angiogram.   We discussed that to treat both lower extremities and less he had significant aortoiliac disease, 2 separate interventions will be required.  Given the fact that his symptoms are roughly equal, either leg can be done first.  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 Also, involved in wound healing. On appropriate medications.   CKD (chronic kidney disease) stage 3, GFR 30-59 ml/min (HCC) Hydrate and limit contrast with the procedure.  Combined hyperlipidemia lipid control important in reducing the progression of atherosclerotic disease. Continue statin therapy       [name] 10/16/2020, 3:13 PM   This note was created with Dragon medical transcription system.  Any errors from dictation are unintentional.

## 2020-10-16 NOTE — Assessment & Plan Note (Signed)
lipid control important in reducing the progression of atherosclerotic disease. Continue statin therapy  

## 2020-10-16 NOTE — Assessment & Plan Note (Signed)
Hydrate and limit contrast with the procedure. °

## 2020-10-16 NOTE — Telephone Encounter (Signed)
Spoke with the patient's caregiver and patient was scheduled for 10/25/20 and 11/01/20. Per the caregiver she cannot do that day so the patient was rescheduled to 10/22/20 with a 10:30 am arrival time to the [facility] and covid testing on 10/18/20 between 8-1 pm at the [facility].

## 2020-10-17 ENCOUNTER — Telehealth (INDEPENDENT_AMBULATORY_CARE_PROVIDER_SITE_OTHER): Payer: Self-pay | Admitting: Vascular Surgery

## 2020-10-18 ENCOUNTER — Other Ambulatory Visit: Payer: Medicare Other

## 2020-10-19 ENCOUNTER — Other Ambulatory Visit: Payer: Self-pay

## 2020-10-19 ENCOUNTER — Other Ambulatory Visit
Admission: RE | Admit: 2020-10-19 | Discharge: 2020-10-19 | Disposition: A | Discharge status: Home / Self Care | Payer: Medicare Other | Source: Ambulatory Visit | Attending: Vascular Surgery | Admitting: Vascular Surgery

## 2020-10-19 ENCOUNTER — Telehealth (INDEPENDENT_AMBULATORY_CARE_PROVIDER_SITE_OTHER): Payer: Self-pay

## 2020-10-19 DIAGNOSIS — Z20822 Contact with and (suspected) exposure to covid-19: Secondary | ICD-10-CM | POA: Diagnosis not present

## 2020-10-19 DIAGNOSIS — Z01812 Encounter for preprocedural laboratory examination: Secondary | ICD-10-CM | POA: Diagnosis present

## 2020-10-19 LAB — SARS CORONAVIRUS 2 (TAT 6-24 HRS): SARS Coronavirus 2: NEGATIVE

## 2020-10-19 NOTE — Telephone Encounter (Signed)
Patient daughter requested to moved her father left le angio from 2/14 to 2/17 after 7:45. Patient procedure has be moved to 2/17 arrival time @7  AM and Covid-19 testing 2/15 between 8-1 pm. Patient daughter has being made aware.

## 2020-10-22 ENCOUNTER — Other Ambulatory Visit: Payer: Self-pay

## 2020-10-22 ENCOUNTER — Other Ambulatory Visit (INDEPENDENT_AMBULATORY_CARE_PROVIDER_SITE_OTHER): Payer: Self-pay | Admitting: Nurse Practitioner

## 2020-10-22 ENCOUNTER — Ambulatory Visit
Admission: RE | Admit: 2020-10-22 | Discharge: 2020-10-22 | Disposition: A | Discharge status: Home / Self Care | Payer: Medicare Other | Attending: Vascular Surgery | Admitting: Vascular Surgery

## 2020-10-22 ENCOUNTER — Encounter
Admission: RE | Disposition: A | Discharge status: Home / Self Care | Payer: Self-pay | Source: Home / Self Care | Attending: Vascular Surgery

## 2020-10-22 ENCOUNTER — Encounter: Payer: Self-pay | Admitting: Vascular Surgery

## 2020-10-22 DIAGNOSIS — F17200 Nicotine dependence, unspecified, uncomplicated: Secondary | ICD-10-CM | POA: Diagnosis not present

## 2020-10-22 DIAGNOSIS — I70223 Atherosclerosis of native arteries of extremities with rest pain, bilateral legs: Secondary | ICD-10-CM

## 2020-10-22 DIAGNOSIS — Z86718 Personal history of other venous thrombosis and embolism: Secondary | ICD-10-CM | POA: Diagnosis not present

## 2020-10-22 DIAGNOSIS — Z7982 Long term (current) use of aspirin: Secondary | ICD-10-CM | POA: Diagnosis not present

## 2020-10-22 DIAGNOSIS — N183 Chronic kidney disease, stage 3 unspecified: Secondary | ICD-10-CM | POA: Diagnosis not present

## 2020-10-22 DIAGNOSIS — I739 Peripheral vascular disease, unspecified: Secondary | ICD-10-CM

## 2020-10-22 DIAGNOSIS — Z79899 Other long term (current) drug therapy: Secondary | ICD-10-CM | POA: Insufficient documentation

## 2020-10-22 DIAGNOSIS — E1122 Type 2 diabetes mellitus with diabetic chronic kidney disease: Secondary | ICD-10-CM | POA: Insufficient documentation

## 2020-10-22 DIAGNOSIS — E782 Mixed hyperlipidemia: Secondary | ICD-10-CM | POA: Insufficient documentation

## 2020-10-22 DIAGNOSIS — E1151 Type 2 diabetes mellitus with diabetic peripheral angiopathy without gangrene: Secondary | ICD-10-CM | POA: Insufficient documentation

## 2020-10-22 HISTORY — DX: Bipolar disorder, unspecified: F31.9

## 2020-10-22 HISTORY — DX: Hyperlipidemia, unspecified: E78.5

## 2020-10-22 HISTORY — PX: LOWER EXTREMITY ANGIOGRAPHY: CATH118251

## 2020-10-22 HISTORY — DX: Peripheral vascular disease, unspecified: I73.9

## 2020-10-22 LAB — CREATININE, SERUM
Creatinine, Ser: 1.54 mg/dL — ABNORMAL HIGH (ref 0.61–1.24)
GFR, Estimated: 46 mL/min — ABNORMAL LOW (ref >60)

## 2020-10-22 LAB — BUN: BUN: 24 mg/dL — ABNORMAL HIGH (ref 8–23)

## 2020-10-22 SURGERY — LOWER EXTREMITY ANGIOGRAPHY
Anesthesia: Moderate Sedation | Site: Leg Lower | Laterality: Right

## 2020-10-22 MED ORDER — SODIUM CHLORIDE 0.9% FLUSH: 3.0000 mL | INTRAVENOUS | Status: DC | PRN

## 2020-10-22 MED ORDER — CLOPIDOGREL BISULFATE 75 MG PO TABS
75.0000 mg | ORAL_TABLET | Freq: Every day | ORAL | 11 refills | Status: DC
Start: 2020-10-22 — End: 2021-05-29

## 2020-10-22 MED ORDER — LABETALOL HCL 5 MG/ML IV SOLN
INTRAVENOUS | Status: AC
  Filled 2020-10-22: qty 4

## 2020-10-22 MED ORDER — MIDAZOLAM HCL 2 MG/2ML IJ SOLN
INTRAMUSCULAR | Status: AC
  Filled 2020-10-22: qty 2

## 2020-10-22 MED ORDER — MIDAZOLAM HCL 2 MG/ML PO SYRP: 8.0000 mg | ORAL_SOLUTION | Freq: Once | ORAL | Status: DC | PRN

## 2020-10-22 MED ORDER — SODIUM CHLORIDE 0.9% FLUSH: 3.0000 mL | Freq: Two times a day (BID) | INTRAVENOUS | Status: DC

## 2020-10-22 MED ORDER — HYDRALAZINE HCL 20 MG/ML IJ SOLN
INTRAMUSCULAR | Status: DC | PRN
  Administered 2020-10-22: 10 mg via INTRAVENOUS

## 2020-10-22 MED ORDER — FENTANYL CITRATE (PF) 100 MCG/2ML IJ SOLN
INTRAMUSCULAR | Status: AC
  Filled 2020-10-22: qty 2

## 2020-10-22 MED ORDER — CEFAZOLIN SODIUM-DEXTROSE 2-4 GM/100ML-% IV SOLN
2.0000 g | Freq: Once | INTRAVENOUS | Status: AC
  Administered 2020-10-22: 2 g via INTRAVENOUS

## 2020-10-22 MED ORDER — CLOPIDOGREL BISULFATE 75 MG PO TABS: 75.0000 mg | ORAL_TABLET | Freq: Every day | ORAL | Status: DC

## 2020-10-22 MED ORDER — FAMOTIDINE 20 MG PO TABS: 40.0000 mg | ORAL_TABLET | Freq: Once | ORAL | Status: DC | PRN

## 2020-10-22 MED ORDER — SODIUM CHLORIDE 0.9 % IV SOLN: INTRAVENOUS | Status: DC

## 2020-10-22 MED ORDER — HYDRALAZINE HCL 20 MG/ML IJ SOLN: 5.0000 mg | INTRAMUSCULAR | Status: DC | PRN

## 2020-10-22 MED ORDER — HEPARIN SODIUM (PORCINE) 1000 UNIT/ML IJ SOLN
INTRAMUSCULAR | Status: AC
  Filled 2020-10-22: qty 1

## 2020-10-22 MED ORDER — IODIXANOL 320 MG/ML IV SOLN
INTRAVENOUS | Status: DC | PRN
  Administered 2020-10-22: 115 mL

## 2020-10-22 MED ORDER — LABETALOL HCL 5 MG/ML IV SOLN
10.0000 mg | INTRAVENOUS | Status: DC | PRN
  Administered 2020-10-22: 10 mg via INTRAVENOUS

## 2020-10-22 MED ORDER — HYDROMORPHONE HCL 1 MG/ML IJ SOLN: 1.0000 mg | Freq: Once | INTRAMUSCULAR | Status: DC | PRN

## 2020-10-22 MED ORDER — ONDANSETRON HCL 4 MG/2ML IJ SOLN: 4.0000 mg | Freq: Four times a day (QID) | INTRAMUSCULAR | Status: DC | PRN

## 2020-10-22 MED ORDER — MIDAZOLAM HCL 2 MG/2ML IJ SOLN
INTRAMUSCULAR | Status: DC | PRN
  Administered 2020-10-22 (×3): 2 mg via INTRAVENOUS

## 2020-10-22 MED ORDER — DIPHENHYDRAMINE HCL 50 MG/ML IJ SOLN: 50.0000 mg | Freq: Once | INTRAMUSCULAR | Status: DC | PRN

## 2020-10-22 MED ORDER — HYDRALAZINE HCL 20 MG/ML IJ SOLN
INTRAMUSCULAR | Status: AC
  Filled 2020-10-22: qty 1

## 2020-10-22 MED ORDER — SODIUM CHLORIDE 0.9 % IV SOLN: 250.0000 mL | INTRAVENOUS | Status: DC | PRN

## 2020-10-22 MED ORDER — FENTANYL CITRATE (PF) 100 MCG/2ML IJ SOLN
INTRAMUSCULAR | Status: DC | PRN
  Administered 2020-10-22 (×3): 50 ug via INTRAVENOUS

## 2020-10-22 MED ORDER — METHYLPREDNISOLONE SODIUM SUCC 125 MG IJ SOLR: 125.0000 mg | Freq: Once | INTRAMUSCULAR | Status: DC | PRN

## 2020-10-22 MED ORDER — ACETAMINOPHEN 325 MG PO TABS: 650.0000 mg | ORAL_TABLET | ORAL | Status: DC | PRN

## 2020-10-22 SURGICAL SUPPLY — 29 items
BALLN DORADO 7X200X80 (BALLOONS) ×4
BALLN LUTONIX 6X120X130 (BALLOONS) ×2
BALLN LUTONIX 6X220X130 (BALLOONS) ×2
BALLN LUTONIX AV 8X40X75 (BALLOONS) ×2
BALLN LUTONIX DCB 6X60X130 (BALLOONS) ×2
BALLN LUTONIX DCB 7X40X130 (BALLOONS) ×2
BALLOON DORADO 7X200X80 (BALLOONS) IMPLANT
BALLOON LUTONIX 6X120X130 (BALLOONS) IMPLANT
BALLOON LUTONIX 6X220X130 (BALLOONS) IMPLANT
BALLOON LUTONIX AV 8X40X75 (BALLOONS) IMPLANT
BALLOON LUTONIX DCB 6X60X130 (BALLOONS) IMPLANT
BALLOON LUTONIX DCB 7X40X130 (BALLOONS) IMPLANT
CATH ANGIO 5F PIGTAIL 65CM (CATHETERS) ×1 IMPLANT
CATH BEACON 5 .035 40 KMP TP (CATHETERS) IMPLANT
CATH BEACON 5 .035 65 KMP TIP (CATHETERS) ×1 IMPLANT
CATH BEACON 5 .038 40 KMP TP (CATHETERS) ×2
CATH CXI 4F 90 DAV (CATHETERS) ×1 IMPLANT
DEVICE STARCLOSE SE CLOSURE (Vascular Products) ×1 IMPLANT
GLIDEWIRE ADV .035X260CM (WIRE) ×1 IMPLANT
KIT ENCORE 26 ADVANTAGE (KITS) ×1 IMPLANT
LIFESTENT SOLO 7X200X135 (Permanent Stent) ×1 IMPLANT
PACK ANGIOGRAPHY (CUSTOM PROCEDURE TRAY) ×2 IMPLANT
SHEATH ANL2 6FRX45 HC (SHEATH) ×1 IMPLANT
SHEATH BRITE TIP 5FRX11 (SHEATH) ×1 IMPLANT
STENT LIFESTAR 8X40 (Permanent Stent) ×2 IMPLANT
STENT LIFESTAR 9X40 (Permanent Stent) ×2 IMPLANT
STENT LIFESTENT 7X150X130 (Permanent Stent) ×1 IMPLANT
TUBING CONTRAST HIGH PRESS 20 (MISCELLANEOUS) ×4 IMPLANT
WIRE GUIDERIGHT .035X150 (WIRE) ×1 IMPLANT

## 2020-10-22 NOTE — Interval H&P Note (Signed)
History and Physical Interval Note:  10/22/2020 10:23 AM  Keith Levy  has presented today for surgery, with the diagnosis of RT leg angio  BARD    PAD  Covid  Feb 4.  The various methods of treatment have been discussed with the patient and family. After consideration of risks, benefits and other options for treatment, the patient has consented to  Procedure(s): LOWER EXTREMITY ANGIOGRAPHY (Right) as a surgical intervention.  The patient's history has been reviewed, patient examined, no change in status, stable for surgery.  I have reviewed the patient's chart and labs.  Questions were answered to the patient's satisfaction.     [name]

## 2020-10-22 NOTE — Op Note (Signed)
[facility]  Percutaneous Study/Intervention Procedural Note   Date of Surgery: 10/22/2020  Surgeon(s):Rex Magee    Assistants:none  Pre-operative Diagnosis: PAD with claudication and early rest pain bilateral lower extremities  Post-operative diagnosis:  Same  Procedure(s) Performed:             1.  Ultrasound guidance for vascular access left femoral artery             2.  Catheter placement into right common femoral artery from left femoral approach             3.  Aortogram and selective right lower extremity angiogram             4.  Percutaneous transluminal angioplasty of left external iliac artery with 6 mm diameter Lutonix drug-coated angioplasty balloon             5.   Percutaneous transluminal angioplasty of the right SFA with 6 mm diameter Lutonix drug-coated angioplasty balloon  6.  Stent placement x2 to the right SFA with 7 mm diameter by 20 cm length and 7 mm diameter by 15 cm length life stents  7.  Stent placement to the right external iliac artery with 8 mm diameter by 4 cm length life star stent  8.  Stent placement to the left external iliac artery with 9 mm diameter by 4 cm length life star stent             9.  StarClose closure device left femoral artery  EBL: 5 cc  Contrast: 115 cc  Fluoro Time: 13.6 minutes  Moderate Conscious Sedation Time: approximately 77 minutes using 6 mg of Versed and 150 mcg of Fentanyl              Indications:  Patient is a 79 y.o.male with disabling claudication symptoms and now some early rest pain. The patient has noninvasive study showing markedly reduced ABIs bilaterally and the 0.50.6 range. The patient is brought in for angiography for further evaluation and potential treatment.  Due to the limb threatening nature of the situation, angiogram was performed for attempted limb salvage. The patient is aware that if the procedure fails, amputation would be expected.  The patient also understands that even  with successful revascularization, amputation may still be required due to the severity of the situation.  Risks and benefits are discussed and informed consent is obtained.   Procedure:  The patient was identified and appropriate procedural time out was performed.  The patient was then placed supine on the table and prepped and draped in the usual sterile fashion. Moderate conscious sedation was administered during a face to face encounter with the patient throughout the procedure with my supervision of the RN administering medicines and monitoring the patient's vital signs, pulse oximetry, telemetry and mental status throughout from the start of the procedure until the patient was taken to the recovery room. Ultrasound was used to evaluate the left common femoral artery.  It was patent .  A digital ultrasound image was acquired.  A Seldinger needle was used to access the left common femoral artery under direct ultrasound guidance and a permanent image was performed.  A 0.035 J wire was advanced without resistance and a 5Fr sheath was placed.  Pigtail catheter was placed into the aorta and an AP aortogram was performed. This demonstrated 2 right renal arteries with at least moderate stenosis of the codominant right renal artery.  The left renal artery had mild  to moderate disease.  The aorta was calcific and mildly ectatic but not stenotic.  The common iliac artery stents had been previously placed had mild residual disease.  There was a stent little further down at the junction of the left common iliac and external iliac arteries which was patent.  The distal left external iliac artery had about an 80% stenosis.  The mid to distal right external iliac artery also had about a 75 to 80% stenosis. I then crossed the aortic bifurcation and advanced to the right femoral head. Selective right lower extremity angiogram was then performed. This demonstrated a densely calcific common femoral artery and bifurcation.   There is at least moderate stenosis of the right profunda femoris artery and a flush occlusion of the right SFA.  There was reconstitution in the mid to distal right SFA.  The distal SFA and popliteal artery were then fairly normal although calcific.  There was not significant tibial disease with both the anterior and posterior tibial arteries being continuous in the foot without focal stenosis. It was felt that it was in the patient's best interest to proceed with intervention after these images to avoid a second procedure and a larger amount of contrast and fluoroscopy based off of the findings from the initial angiogram. The patient was systemically heparinized and I started by ballooning the left external iliac lesion before trying to put in up and over sheath through.  A 6 mm diameter by 6 cm length Lutonix drug-coated angioplasty balloon was inflated to 14 atm for 1 minute.  There was still greater than 50% residual stenosis but we would treat this with a stent on the way out.  A 6 French Ansell sheath was then placed over the [employer] wire. I then used a Kumpe catheter and the advantage wire to get into the SFA occlusion and cross it with minimal difficulty confirming intraluminal flow in the popliteal artery.  The advantage wire was then replaced.  2 6 mm diameter Lutonix drug-coated angioplasty balloons were then used to treat the SFA lesion.  The first was 22 cm in length and the second was 15 cm in length.  These were inflated to 12 atm for 1 minute.  Completion imaging showed high-grade residual stenosis throughout the proximal SFA.  I elected to treat this with stent placement.  A 7 mm diameter by 20 cm length life stent and then a 7 mm diameter by 15 cm length life stent were deployed up to the SFA origin down to the distal SFA.  These were postdilated with a 7 mm diameter balloon with less than 20% residual stenosis.  I then turned my attention to the iliac disease.  The right external iliac  stenosis was then treated with an 8 mm diameter by 4 cm length life star stent.  This was then postdilated with a 7 mm diameter Lutonix drug-coated angioplasty balloon with 10% residual stenosis.  I then selected a 9 mm diameter by 4 cm length life star stent for the left external iliac lesion.  This was deployed in the distal left external iliac artery and postdilated with an 8 mm diameter Lutonix drug-coated angioplasty balloon with less than 10% residual stenosis. I elected to terminate the procedure. The sheath was removed and StarClose closure device was deployed in the left femoral artery with excellent hemostatic result. The patient was taken to the recovery room in stable condition having tolerated the procedure well.  Findings:  Aortogram:  2 right renal arteries with at least moderate stenosis of the codominant right renal artery.  The left renal artery had mild to moderate disease.  The aorta was calcific and mildly ectatic but not stenotic.  The common iliac artery stents had been previously placed had mild residual disease.  There was a stent little further down at the junction of the left common iliac and external iliac arteries which was patent.  The distal left external iliac artery had about an 80% stenosis.  The mid to distal right external iliac artery also had about a 75 to 80% stenosis.             Right lower Extremity:  This demonstrated a densely calcific common femoral artery and bifurcation.  There is at least moderate stenosis of the right profunda femoris artery and a flush occlusion of the right SFA.  There was reconstitution in the mid to distal right SFA.  The distal SFA and popliteal artery were then fairly normal although calcific.  There was not significant tibial disease with both the anterior and posterior tibial arteries being continuous in the foot without focal stenosis   Disposition: Patient was taken to the recovery room in stable condition having  tolerated the procedure well.  Complications: None  [name] 10/22/2020 1:57 PM   This note was created with Dragon Medical transcription system. Any errors in dictation are purely unintentional.

## 2020-10-23 ENCOUNTER — Encounter: Payer: Self-pay | Admitting: Vascular Surgery

## 2020-10-25 ENCOUNTER — Other Ambulatory Visit: Payer: Medicare Other

## 2020-10-29 DIAGNOSIS — I739 Peripheral vascular disease, unspecified: Secondary | ICD-10-CM

## 2020-10-30 ENCOUNTER — Other Ambulatory Visit: Payer: Self-pay

## 2020-10-30 ENCOUNTER — Other Ambulatory Visit
Admission: RE | Admit: 2020-10-30 | Discharge: 2020-10-30 | Disposition: A | Discharge status: Home / Self Care | Payer: Medicare Other | Source: Ambulatory Visit | Attending: Vascular Surgery | Admitting: Vascular Surgery

## 2020-10-30 DIAGNOSIS — Z01812 Encounter for preprocedural laboratory examination: Secondary | ICD-10-CM | POA: Insufficient documentation

## 2020-10-30 DIAGNOSIS — Z20822 Contact with and (suspected) exposure to covid-19: Secondary | ICD-10-CM | POA: Insufficient documentation

## 2020-10-30 LAB — SARS CORONAVIRUS 2 (TAT 6-24 HRS): SARS Coronavirus 2: NEGATIVE

## 2020-11-01 ENCOUNTER — Encounter
Admission: RE | Disposition: A | Discharge status: Home / Self Care | Payer: Self-pay | Source: Home / Self Care | Attending: Vascular Surgery

## 2020-11-01 ENCOUNTER — Other Ambulatory Visit: Payer: Self-pay

## 2020-11-01 ENCOUNTER — Encounter: Payer: Self-pay | Admitting: Vascular Surgery

## 2020-11-01 ENCOUNTER — Ambulatory Visit
Admission: RE | Admit: 2020-11-01 | Discharge: 2020-11-01 | Disposition: A | Discharge status: Home / Self Care | Payer: Medicare Other | Attending: Vascular Surgery | Admitting: Vascular Surgery

## 2020-11-01 ENCOUNTER — Other Ambulatory Visit (INDEPENDENT_AMBULATORY_CARE_PROVIDER_SITE_OTHER): Payer: Self-pay | Admitting: Nurse Practitioner

## 2020-11-01 DIAGNOSIS — E1122 Type 2 diabetes mellitus with diabetic chronic kidney disease: Secondary | ICD-10-CM | POA: Insufficient documentation

## 2020-11-01 DIAGNOSIS — F17200 Nicotine dependence, unspecified, uncomplicated: Secondary | ICD-10-CM | POA: Insufficient documentation

## 2020-11-01 DIAGNOSIS — I70213 Atherosclerosis of native arteries of extremities with intermittent claudication, bilateral legs: Secondary | ICD-10-CM | POA: Diagnosis not present

## 2020-11-01 DIAGNOSIS — Z95828 Presence of other vascular implants and grafts: Secondary | ICD-10-CM | POA: Diagnosis not present

## 2020-11-01 DIAGNOSIS — E1151 Type 2 diabetes mellitus with diabetic peripheral angiopathy without gangrene: Secondary | ICD-10-CM | POA: Insufficient documentation

## 2020-11-01 DIAGNOSIS — E782 Mixed hyperlipidemia: Secondary | ICD-10-CM | POA: Diagnosis not present

## 2020-11-01 DIAGNOSIS — N183 Chronic kidney disease, stage 3 unspecified: Secondary | ICD-10-CM | POA: Diagnosis not present

## 2020-11-01 DIAGNOSIS — I739 Peripheral vascular disease, unspecified: Secondary | ICD-10-CM

## 2020-11-01 DIAGNOSIS — Z86718 Personal history of other venous thrombosis and embolism: Secondary | ICD-10-CM | POA: Insufficient documentation

## 2020-11-01 DIAGNOSIS — Z7982 Long term (current) use of aspirin: Secondary | ICD-10-CM | POA: Insufficient documentation

## 2020-11-01 DIAGNOSIS — Z79899 Other long term (current) drug therapy: Secondary | ICD-10-CM | POA: Diagnosis not present

## 2020-11-01 HISTORY — PX: LOWER EXTREMITY ANGIOGRAPHY: CATH118251

## 2020-11-01 LAB — CREATININE, SERUM
Creatinine, Ser: 1.38 mg/dL — ABNORMAL HIGH (ref 0.61–1.24)
GFR, Estimated: 53 mL/min — ABNORMAL LOW (ref >60)

## 2020-11-01 LAB — BUN: BUN: 19 mg/dL (ref 8–23)

## 2020-11-01 SURGERY — LOWER EXTREMITY ANGIOGRAPHY
Anesthesia: Moderate Sedation | Site: Leg Lower | Laterality: Left

## 2020-11-01 MED ORDER — LABETALOL HCL 5 MG/ML IV SOLN: 10.0000 mg | INTRAVENOUS | Status: DC | PRN

## 2020-11-01 MED ORDER — ACETAMINOPHEN 325 MG PO TABS: 650.0000 mg | ORAL_TABLET | ORAL | Status: DC | PRN

## 2020-11-01 MED ORDER — SODIUM CHLORIDE 0.9% FLUSH: 3.0000 mL | INTRAVENOUS | Status: DC | PRN

## 2020-11-01 MED ORDER — DIPHENHYDRAMINE HCL 50 MG/ML IJ SOLN: 50.0000 mg | Freq: Once | INTRAMUSCULAR | Status: DC | PRN

## 2020-11-01 MED ORDER — CEFAZOLIN SODIUM-DEXTROSE 2-4 GM/100ML-% IV SOLN
INTRAVENOUS | Status: AC
  Administered 2020-11-01: 2 g via INTRAVENOUS
  Filled 2020-11-01: qty 100

## 2020-11-01 MED ORDER — CEFAZOLIN SODIUM-DEXTROSE 2-4 GM/100ML-% IV SOLN: 2.0000 g | Freq: Once | INTRAVENOUS | Status: AC

## 2020-11-01 MED ORDER — SODIUM CHLORIDE 0.9 % IV SOLN: 250.0000 mL | INTRAVENOUS | Status: DC | PRN

## 2020-11-01 MED ORDER — ONDANSETRON HCL 4 MG/2ML IJ SOLN: 4.0000 mg | Freq: Four times a day (QID) | INTRAMUSCULAR | Status: DC | PRN

## 2020-11-01 MED ORDER — HEPARIN SODIUM (PORCINE) 1000 UNIT/ML IJ SOLN
INTRAMUSCULAR | Status: DC | PRN
  Administered 2020-11-01: 5000 [IU] via INTRAVENOUS

## 2020-11-01 MED ORDER — MIDAZOLAM HCL 2 MG/ML PO SYRP: 8.0000 mg | ORAL_SOLUTION | Freq: Once | ORAL | Status: DC | PRN

## 2020-11-01 MED ORDER — MIDAZOLAM HCL 2 MG/2ML IJ SOLN
INTRAMUSCULAR | Status: DC | PRN
  Administered 2020-11-01: 2 mg via INTRAVENOUS

## 2020-11-01 MED ORDER — MIDAZOLAM HCL 5 MG/5ML IJ SOLN
INTRAMUSCULAR | Status: AC
  Filled 2020-11-01: qty 5

## 2020-11-01 MED ORDER — FENTANYL CITRATE (PF) 100 MCG/2ML IJ SOLN
INTRAMUSCULAR | Status: DC | PRN
  Administered 2020-11-01: 50 ug via INTRAVENOUS

## 2020-11-01 MED ORDER — HYDRALAZINE HCL 20 MG/ML IJ SOLN: 5.0000 mg | INTRAMUSCULAR | Status: DC | PRN

## 2020-11-01 MED ORDER — FAMOTIDINE 20 MG PO TABS: 40.0000 mg | ORAL_TABLET | Freq: Once | ORAL | Status: DC | PRN

## 2020-11-01 MED ORDER — FENTANYL CITRATE (PF) 100 MCG/2ML IJ SOLN
INTRAMUSCULAR | Status: AC
  Filled 2020-11-01: qty 2

## 2020-11-01 MED ORDER — HYDROMORPHONE HCL 1 MG/ML IJ SOLN: 1.0000 mg | Freq: Once | INTRAMUSCULAR | Status: DC | PRN

## 2020-11-01 MED ORDER — SODIUM CHLORIDE 0.9 % IV SOLN: INTRAVENOUS | Status: DC

## 2020-11-01 MED ORDER — SODIUM CHLORIDE 0.9% FLUSH: 3.0000 mL | Freq: Two times a day (BID) | INTRAVENOUS | Status: DC

## 2020-11-01 MED ORDER — METHYLPREDNISOLONE SODIUM SUCC 125 MG IJ SOLR: 125.0000 mg | Freq: Once | INTRAMUSCULAR | Status: DC | PRN

## 2020-11-01 MED ORDER — HEPARIN SODIUM (PORCINE) 1000 UNIT/ML IJ SOLN
INTRAMUSCULAR | Status: AC
  Filled 2020-11-01: qty 1

## 2020-11-01 MED ORDER — IODIXANOL 320 MG/ML IV SOLN
INTRAVENOUS | Status: DC | PRN
  Administered 2020-11-01: 75 mL

## 2020-11-01 SURGICAL SUPPLY — 25 items
BALLN LUTONIX 018 5X150X130 (BALLOONS) ×2
BALLN LUTONIX 018 6X150X130 (BALLOONS) ×2
BALLN LUTONIX DCB 6X60X130 (BALLOONS) ×2
BALLN LUTONIX DCB 7X60X130 (BALLOONS) ×2
BALLOON LUTONIX 018 5X150X130 (BALLOONS) ×1 IMPLANT
BALLOON LUTONIX 018 6X150X130 (BALLOONS) IMPLANT
BALLOON LUTONIX DCB 6X60X130 (BALLOONS) IMPLANT
BALLOON LUTONIX DCB 7X60X130 (BALLOONS) ×1 IMPLANT
CATH ANGIO 5F PIGTAIL 65CM (CATHETERS) ×1 IMPLANT
CATH BEACON 5 .035 40 KMP TP (CATHETERS) IMPLANT
CATH BEACON 5 .038 40 KMP TP (CATHETERS) ×2
CATH ROTAREX 135 6FR (CATHETERS) ×1 IMPLANT
CATH VERT 5X100 (CATHETERS) ×2 IMPLANT
DEVICE STARCLOSE SE CLOSURE (Vascular Products) ×1 IMPLANT
GLIDEWIRE ADV .035X260CM (WIRE) ×1 IMPLANT
KIT ENCORE 26 ADVANTAGE (KITS) ×1 IMPLANT
PACK ANGIOGRAPHY (CUSTOM PROCEDURE TRAY) ×2 IMPLANT
SHEATH ANL2 6FRX45 HC (SHEATH) ×1 IMPLANT
SHEATH BRITE TIP 5FRX11 (SHEATH) ×1 IMPLANT
STENT LIFESTAR 8X60X80 (Permanent Stent) ×1 IMPLANT
STENT VIABAHN 6X150X120 (Permanent Stent) ×1 IMPLANT
SYR MEDRAD MARK 7 150ML (SYRINGE) ×1 IMPLANT
TUBING CONTRAST HIGH PRESS 72 (TUBING) ×1 IMPLANT
WIRE G V18X300CM (WIRE) ×1 IMPLANT
WIRE GUIDERIGHT .035X150 (WIRE) ×1 IMPLANT

## 2020-11-01 NOTE — Interval H&P Note (Signed)
History and Physical Interval Note:  11/01/2020 8:01 AM  Keith Levy  has presented today for surgery, with the diagnosis of LT leg angio   BARD    PAD Covid  Feb 11.  The various methods of treatment have been discussed with the patient and family. After consideration of risks, benefits and other options for treatment, the patient has consented to  Procedure(s): LOWER EXTREMITY ANGIOGRAPHY (Left) as a surgical intervention.  The patient's history has been reviewed, patient examined, no change in status, stable for surgery.  I have reviewed the patient's chart and labs.  Questions were answered to the patient's satisfaction.     [name]

## 2020-11-01 NOTE — Op Note (Signed)
[facility]  Percutaneous Study/Intervention Procedural Note   Date of Surgery: 11/01/2020  Surgeon(s):Layana Konkel    Assistants:none  Pre-operative Diagnosis: PAD with claudication bilateral lower extremity  Post-operative diagnosis:  Same  Procedure(s) Performed:             1.  Ultrasound guidance for vascular access right femoral artery             2.  Catheter placement into left common femoral artery from right femoral approach             3.  Aortogram and selective left lower extremity angiogram             4.  Percutaneous transluminal angioplasty of right distal common and proximal external iliac artery with 6 mm diameter Lutonix drug-coated angioplasty balloon             5.  Mechanical thrombectomy with the roto-Rex device to the left distal SFA and proximal popliteal artery  6. Balloon angioplasty of the distal left SFA and above-knee popliteal artery with 5 mm diameter by 15 cm length Lutonix drug-coated angioplasty balloon  7. Viabahn stent placement to the distal left SFA and above-knee popliteal artery with 6 mm diameter by 15 cm length stent for residual stenosis after angioplasty  8. Stent placement to the distal right common and proximal right external iliac artery with 8 mm diameter by 6 cm length life star stent             9.  StarClose closure device right femoral artery  EBL: 5 cc  Contrast: 75 cc  Fluoro Time: 9.5 minutes  Moderate Conscious Sedation Time: approximately 49 minutes using 2 mg of Versed and 50 mcg of Fentanyl              Indications:  Patient is a 80 y.o.male with disabling claudication symptoms and may be some early rest Levy of both lower extremities. He had right lower extremity intervention recently with some improvement in his symptoms. The patient has noninvasive study showing significantly reduced ABIs bilaterally. The patient is brought in for angiography for further evaluation and potential treatment. Risks and  benefits are discussed and informed consent is obtained.   Procedure:  The patient was identified and appropriate procedural time out was performed.  The patient was then placed supine on the table and prepped and draped in the usual sterile fashion. Moderate conscious sedation was administered during a face to face encounter with the patient throughout the procedure with my supervision of the RN administering medicines and monitoring the patient's vital signs, pulse oximetry, telemetry and mental status throughout from the start of the procedure until the patient was taken to the recovery room. Ultrasound was used to evaluate the right common femoral artery.  It was patent .  A digital ultrasound image was acquired.  A Seldinger needle was used to access the right common femoral artery under direct ultrasound guidance and a permanent image was performed.  A 0.035 J wire was advanced without resistance and a 5Fr sheath was placed.  Pigtail catheter was placed into the aorta and an AP aortogram was performed. This demonstrated 2 right renal arteries a moderate stenosis of the codominant right renal artery. The left renal artery had mild to moderate stenosis. The artery was very calcific and mildly ectatic but not stenotic. The common iliac stents had previous in place and had mild residual disease although seen on imaging today was a stenosis at  the right iliac bifurcation that was highly calcific in the 60% range between the most recently placed stent in the right external iliac artery and the previously placed stent. The left external iliac artery stent and the remainder of the left iliac system appeared to be patent. I then crossed the aortic bifurcation and advanced to the left femoral head. Selective left lower extremity angiogram was then performed. This demonstrated mild disease in the proximal and mid SFA but occlusion of the distal SFA a couple centimeters above the previously placed stent. This  reconstituted above-knee popliteal artery just below the stent which had some moderate disease as well. The below-knee popliteal artery normalized and there was good runoff through a dominant posterior tibial artery. The anterior tibial artery was also patent although it appeared to occlude at the ankle in an area of a previous ankle fracture surgery. The peroneal artery is patent but small. It was felt that it was in the patient's best interest to proceed with intervention after these images to avoid a second procedure and a larger amount of contrast and fluoroscopy based off of the findings from the initial angiogram. The patient was systemically heparinized and I started balloon angioplasty treatment of the right common and external iliac artery stenosis with a 6 mm diameter by 6 cm length Lutonix drug-coated angioplasty balloon inflated to 12 atm for 1 minute. There remained some residual disease of greater than 50% which was highly calcific and I would stent this near the end of the procedure. A 6 French Ansell sheath was then placed over the [employer] wire. I then used a Kumpe catheter and the advantage wire to easily cross the occlusion in the left SFA and popliteal artery and confirm intraluminal flow in the popliteal artery. I then placed a V 18 wire. Mechanical thrombectomy was performed to debulk the chronic thrombus in the occluded stent and just above the previous stent. This was done with the roto-Rex device and 2 passes were made with the roto-Rex device in the left SFA and popliteal arteries. There is now a channel but significant residual disease that was treated with 5 mm diameter by 15 cm length Lutonix drug-coated angioplasty balloon inflated to 12 atm for 1 minute. Completion imaging showed greater than 50% residual stenosis both at the top and the bottom of the previously placed stent and so a 6 mm diameter by 15 cm length Viabahn stent was deployed in the distal left SFA and above-knee  popliteal artery. This is postdilated with 6 mm balloon with excellent angiographic completion result and less than 10% residual stenosis. I then pulled the sheath back to the right external iliac artery and treated the distal right common and proximal right external iliac artery stenosis with an 8 mm diameter by 6 cm length life star stent postdilated with a 7 mm balloon with excellent angiographic completion result in about 10% residual stenosis. I elected to terminate the procedure. The sheath was removed and StarClose closure device was deployed in the right femoral artery with excellent hemostatic result. The patient was taken to the recovery room in stable condition having tolerated the procedure well.  Findings:               Aortogram:  2 right renal arteries a moderate stenosis of the codominant right renal artery. The left renal artery had mild to moderate stenosis. The artery was very calcific and mildly ectatic but not stenotic. The common iliac stents had previous in place and had  mild residual disease although seen on imaging today was a stenosis at the right iliac bifurcation that was highly calcific in the 60% range between the most recently placed stent in the right external iliac artery and the previously placed stent. The left external iliac artery stent and the remainder of the left iliac system appeared to be patent.             Left lower Extremity:  This demonstrated mild disease in the proximal and mid SFA but occlusion of the distal SFA a couple centimeters above the previously placed stent. This reconstituted above-knee popliteal artery just below the stent which had some moderate disease as well. The below-knee popliteal artery normalized and there was good runoff through a dominant posterior tibial artery. The anterior tibial artery was also patent although it appeared to occlude at the ankle in an area of a previous ankle fracture surgery. The peroneal artery is patent but  small   Disposition: Patient was taken to the recovery room in stable condition having tolerated the procedure well.  Complications: None  Keith Levy 11/01/2020 9:22 AM   This note was created with Dragon Medical transcription system. Any errors in dictation are purely unintentional.

## 2020-11-01 NOTE — Discharge Instructions (Signed)
Angiogram, Care After This sheet gives you information about how to care for yourself after your procedure. Your doctor may also give you more specific instructions. If you have problems or questions, contact your doctor. What can I expect after the procedure? After the procedure, it is common to have these problems at the point where the catheter was inserted:  Bruising.  Tenderness.  A collection of blood (hematoma). This may feel like a small lump under the skin at the insertion site. Follow these instructions at home: Insertion site care  Follow instructions from your doctor about how to take care of the area where the catheter was inserted. Make sure you: ? Wash your hands with soap and water before you change your bandage (dressing). If you cannot use soap and water, use hand sanitizer. ? Change your bandage as told by your doctor.  Do not take baths, swim, or use a hot tub until your doctor says it is okay.  You may shower 24-48 hours after the procedure, or as told by your doctor. To clean the area: ? Gently wash the area with plain soap and water. ? Pat the area dry with a clean towel. ? Do not rub the area. This may cause bleeding.  Check your insertion area every day for signs of infection. Check for: ? Redness, swelling, or pain. ? Fluid or blood. ? Warmth. ? Pus or a bad smell.  Do not apply powder or lotion to the area. Keep the area clean and dry.   Activity  Do not drive for 24 hours if you were given a medicine to help you relax (sedative).  Rest as told by your doctor, usually for 1-2 days.  Do not lift anything that is heavier than 10 lbs. (4.5 kg) or as told by your doctor.  If your insertion site was in your leg, try to avoid stairs for a few days.  Return to your normal activities as told by your doctor. Ask your doctor what activities are safe for you. General instructions  If the insertion area starts to bleed, lie flat and put pressure on the area.  If the bleeding does not stop, get help right away. This is an emergency.  Take over-the-counter and prescription medicines only as told by your doctor.  Drink enough fluid to keep your pee (urine) pale yellow.  Keep all follow-up visits as told by your doctor. This is important.   Contact a doctor if:  You have a fever.  You have chills.  You have redness, swelling, or pain around your insertion area.  You have fluid or blood coming from your insertion area.  The insertion area feels warm to the touch.  You have pus or a bad smell coming from your insertion area.  You have more bruising around the insertion area. Get help right away if:  You have a lot of pain in the insertion area.  The insertion area swells very fast.  The insertion area is bleeding, and the bleeding does not stop after you hold steady pressure on the area.  The area around the insertion area becomes pale, cool, tingly, or numb.  You have chest pain.  You have trouble breathing.  You have a rash.  You have any signs of a stroke. "BE FAST" is an easy way to remember the main warning signs: ? B - Balance. Signs are dizziness, sudden trouble walking, or loss of balance. ? E - Eyes. Signs are trouble seeing or a change  in how you see. ? F - Face. Signs are sudden weakness or loss of feeling of the face, or the face or eyelid drooping on one side. ? A - Arms. Signs are weakness or loss of feeling in an arm. This happens suddenly and usually on one side of the body. ? S - Speech. Signs are sudden trouble speaking, slurred speech, or trouble understanding what people say. ? T - Time. Time to call emergency services. Write down what time symptoms started.  You have other signs of a stroke, such as: ? A sudden, very bad headache with no known cause. ? Feeling like you may vomit (nausea). ? Vomiting. ? A seizure. These symptoms may be an emergency. Do not wait to see if the symptoms will go away. Get  medical help right away. Call your local emergency services (911 in the [place] Do not drive yourself to the hospital. Summary  After the procedure, it is common to have bruising and tenderness at the insertion area.  Do not take baths, swim, or use a hot tub until your doctor says it is okay to do so. You may shower 24-48 hours after the procedure or as told by your doctor.  It is important to rest and drink plenty of fluids.  If the insertion area starts to bleed, lie flat and put pressure on the area. If the bleeding does not stop, get help right away. This is an emergency. This information is not intended to replace advice given to you by your health care provider. Make sure you discuss any questions you have with your health care provider. Document Revised: 07/06/2019 Document Reviewed: 07/06/2019 Elsevier Patient Education  2021 [place].  Moderate Conscious Sedation, Adult, Care After This sheet gives you information about how to care for yourself after your procedure. Your health care provider may also give you more specific instructions. If you have problems or questions, contact your health care provider. What can I expect after the procedure? After the procedure, it is common to have:  Sleepiness for several hours.  Impaired judgment for several hours.  Difficulty with balance.  Vomiting if you eat too soon. Follow these instructions at home: For the time period you were told by your health care provider:  Rest.  Do not participate in activities where you could fall or become injured.  Do not drive or use machinery.  Do not drink alcohol.  Do not take sleeping pills or medicines that cause drowsiness.  Do not make important decisions or sign legal documents.  Do not take care of children on your own.      Eating and drinking  Follow the diet recommended by your health care provider.  Drink enough fluid to keep your urine pale yellow.  If you  vomit: ? Drink water, juice, or soup when you can drink without vomiting. ? Make sure you have little or no nausea before eating solid foods.   General instructions  Take over-the-counter and prescription medicines only as told by your health care provider.  Have a responsible adult stay with you for the time you are told. It is important to have someone help care for you until you are awake and alert.  Do not smoke.  Keep all follow-up visits as told by your health care provider. This is important. Contact a health care provider if:  You are still sleepy or having trouble with balance after 24 hours.  You feel light-headed.  You keep feeling nauseous or  you keep vomiting.  You develop a rash.  You have a fever.  You have redness or swelling around the IV site. Get help right away if:  You have trouble breathing.  You have new-onset confusion at home. Summary  After the procedure, it is common to feel sleepy, have impaired judgment, or feel nauseous if you eat too soon.  Rest after you get home. Know the things you should not do after the procedure.  Follow the diet recommended by your health care provider and drink enough fluid to keep your urine pale yellow.  Get help right away if you have trouble breathing or new-onset confusion at home. This information is not intended to replace advice given to you by your health care provider. Make sure you discuss any questions you have with your health care provider. Document Revised: 12/30/2019 Document Reviewed: 07/28/2019 Elsevier Patient Education  2021 [place].  Femoral Site Care  This sheet gives you information about how to care for yourself after your procedure. Your health care provider may also give you more specific instructions. If you have problems or questions, contact your health care provider. What can I expect after the procedure? After the procedure, it is common to have:  Bruising that usually fades  within 1-2 weeks.  Tenderness at the site. Follow these instructions at home: Wound care  Follow instructions from your health care provider about how to take care of your insertion site. Make sure you: ? Wash your hands with soap and water before you change your bandage (dressing). If soap and water are not available, use hand sanitizer. ? Change your dressing as told by your health care provider. ? Leave stitches (sutures), skin glue, or adhesive strips in place. These skin closures may need to stay in place for 2 weeks or longer. If adhesive strip edges start to loosen and curl up, you may trim the loose edges. Do not remove adhesive strips completely unless your health care provider tells you to do that.  Do not take baths, swim, or use a hot tub until your health care provider approves.  You may shower 24-48 hours after the procedure or as told by your health care provider. ? Gently wash the site with plain soap and water. ? Pat the area dry with a clean towel. ? Do not rub the site. This may cause bleeding.  Do not apply powder or lotion to the site. Keep the site clean and dry.  Check your femoral site every day for signs of infection. Check for: ? Redness, swelling, or pain. ? Fluid or blood. ? Warmth. ? Pus or a bad smell. Activity  For the first 2-3 days after your procedure, or as long as directed: ? Avoid climbing stairs as much as possible. ? Do not squat.  Do not lift anything that is heavier than 10 lb (4.5 kg), or the limit that you are told, until your health care provider says that it is safe.  Rest as directed. ? Avoid sitting for a long time without moving. Get up to take short walks every 1-2 hours.  Do not drive for 24 hours if you were given a medicine to help you relax (sedative). General instructions  Take over-the-counter and prescription medicines only as told by your health care provider.  Keep all follow-up visits as told by your health care  provider. This is important. Contact a health care provider if you have:  A fever or chills.  You have redness, swelling,  or pain around your insertion site. Get help right away if:  The catheter insertion area swells very fast.  You pass out.  You suddenly start to sweat or your skin gets clammy.  The catheter insertion area is bleeding, and the bleeding does not stop when you hold steady pressure on the area.  The area near or just beyond the catheter insertion site becomes pale, cool, tingly, or numb. These symptoms may represent a serious problem that is an emergency. Do not wait to see if the symptoms will go away. Get medical help right away. Call your local emergency services (911 in the [place] Do not drive yourself to the hospital. Summary  After the procedure, it is common to have bruising that usually fades within 1-2 weeks.  Check your femoral site every day for signs of infection.  Do not lift anything that is heavier than 10 lb (4.5 kg), or the limit that you are told, until your health care provider says that it is safe. This information is not intended to replace advice given to you by your health care provider. Make sure you discuss any questions you have with your health care provider. Document Revised: 05/04/2020 Document Reviewed: 05/04/2020 Elsevier Patient Education  [place].

## 2020-11-13 DIAGNOSIS — F1010 Alcohol abuse, uncomplicated: Secondary | ICD-10-CM | POA: Insufficient documentation

## 2020-11-21 NOTE — Telephone Encounter (Signed)
Complete

## 2020-11-27 ENCOUNTER — Other Ambulatory Visit (INDEPENDENT_AMBULATORY_CARE_PROVIDER_SITE_OTHER): Payer: Self-pay | Admitting: Vascular Surgery

## 2020-11-27 DIAGNOSIS — Z95820 Peripheral vascular angioplasty status with implants and grafts: Secondary | ICD-10-CM

## 2020-11-27 DIAGNOSIS — I70213 Atherosclerosis of native arteries of extremities with intermittent claudication, bilateral legs: Secondary | ICD-10-CM

## 2020-11-28 ENCOUNTER — Ambulatory Visit (INDEPENDENT_AMBULATORY_CARE_PROVIDER_SITE_OTHER): Payer: Medicare Other | Admitting: Nurse Practitioner

## 2020-11-28 ENCOUNTER — Ambulatory Visit (INDEPENDENT_AMBULATORY_CARE_PROVIDER_SITE_OTHER): Payer: Medicare Other

## 2020-11-28 ENCOUNTER — Other Ambulatory Visit: Payer: Self-pay

## 2020-11-28 ENCOUNTER — Encounter (INDEPENDENT_AMBULATORY_CARE_PROVIDER_SITE_OTHER): Payer: Self-pay | Admitting: Nurse Practitioner

## 2020-11-28 VITALS — BP 163/83 | HR 80 | Ht 71.0 in | Wt 222.0 lb

## 2020-11-28 DIAGNOSIS — E782 Mixed hyperlipidemia: Secondary | ICD-10-CM

## 2020-11-28 DIAGNOSIS — F17200 Nicotine dependence, unspecified, uncomplicated: Secondary | ICD-10-CM | POA: Diagnosis not present

## 2020-11-28 DIAGNOSIS — Z95820 Peripheral vascular angioplasty status with implants and grafts: Secondary | ICD-10-CM

## 2020-11-28 DIAGNOSIS — E1151 Type 2 diabetes mellitus with diabetic peripheral angiopathy without gangrene: Secondary | ICD-10-CM | POA: Diagnosis not present

## 2020-11-28 DIAGNOSIS — I70213 Atherosclerosis of native arteries of extremities with intermittent claudication, bilateral legs: Secondary | ICD-10-CM | POA: Diagnosis not present

## 2020-11-29 ENCOUNTER — Telehealth (INDEPENDENT_AMBULATORY_CARE_PROVIDER_SITE_OTHER): Payer: Self-pay

## 2020-11-29 ENCOUNTER — Ambulatory Visit (INDEPENDENT_AMBULATORY_CARE_PROVIDER_SITE_OTHER): Payer: Medicare Other | Admitting: Nurse Practitioner

## 2020-11-29 ENCOUNTER — Encounter (INDEPENDENT_AMBULATORY_CARE_PROVIDER_SITE_OTHER): Payer: Medicare Other

## 2020-11-29 NOTE — Telephone Encounter (Signed)
Patient's daughter called back and the patient is scheduled for a RLE angio on 12/06/20 with a 9:00 am arrival time to the [facility]. Covid testing on 12/04/20 between 8-2 pm at the [facility]. Pre-procedure instructions were discussed and will be mailed.

## 2020-11-29 NOTE — Telephone Encounter (Signed)
I attempted to contact the patient's sister as requested to schedule the patient for a RLE angio with Dr. Lucky Cowboy and a message was left for a return call.

## 2020-12-04 ENCOUNTER — Other Ambulatory Visit
Admission: RE | Admit: 2020-12-04 | Discharge: 2020-12-04 | Disposition: A | Discharge status: Home / Self Care | Payer: Medicare Other | Source: Ambulatory Visit | Attending: Vascular Surgery | Admitting: Vascular Surgery

## 2020-12-04 ENCOUNTER — Other Ambulatory Visit: Payer: Self-pay

## 2020-12-04 ENCOUNTER — Encounter (INDEPENDENT_AMBULATORY_CARE_PROVIDER_SITE_OTHER): Payer: Self-pay | Admitting: Nurse Practitioner

## 2020-12-04 DIAGNOSIS — Z01812 Encounter for preprocedural laboratory examination: Secondary | ICD-10-CM | POA: Diagnosis present

## 2020-12-04 DIAGNOSIS — Z20822 Contact with and (suspected) exposure to covid-19: Secondary | ICD-10-CM | POA: Insufficient documentation

## 2020-12-04 LAB — SARS CORONAVIRUS 2 (TAT 6-24 HRS): SARS Coronavirus 2: NEGATIVE

## 2020-12-04 NOTE — Progress Notes (Signed)
Subjective:    Patient ID: Lewie Loron, male    DOB: 1941-04-26, 80 y.o.   MRN: [id number] Chief Complaint  Patient presents with  . Follow-up    4 wk [facility] post le angio abi     The patient returns to the office for followup and review status post angiogram with intervention. The patient notes improvement in the lower extremity symptoms however the symptoms recently returned.  There is interval shortening of the patient's claudication distance or rest pain symptoms.   No new ulcers or wounds have occurred since the last visit.  The patient underwent intervention on 11/01/2020 including:  Procedure(s) Performed: 1. Ultrasound guidance for vascular access right femoral artery 2. Catheter placement into left common femoral artery from right femoral approach 3. Aortogram and selective left lower extremity angiogram 4. Percutaneous transluminal angioplasty of right distal common and proximal external iliac artery with 6 mm diameter Lutonix drug-coated angioplasty balloon 5. Mechanical thrombectomy with the roto-Rex device to the left distal SFA and proximal popliteal artery             6. Balloon angioplasty of the distal left SFA and above-knee popliteal artery with 5 mm diameter by 15 cm length Lutonix drug-coated angioplasty balloon             7. Viabahn stent placement to the distal left SFA and above-knee popliteal artery with 6 mm diameter by 15 cm length stent for residual stenosis after angioplasty             8. Stent placement to the distal right common and proximal right external iliac artery with 8 mm diameter by 6 cm length life star stent 9. StarClose closure device right femoral artery  There have been no significant changes to the patient's overall health care.  The patient denies amaurosis fugax or recent TIA symptoms. There are no recent neurological changes noted. The patient denies history  of DVT, PE or superficial thrombophlebitis. The patient denies recent episodes of angina or shortness of breath.   ABI's Rt=0.73 and Lt=0.98  (previous ABI's Rt=0.59 and Lt=0.66) Duplex US of the right tibial arteries reveals monophasic waveforms with dampened toe waveforms.  The left lower extremity has biphasic waveforms with normal toe waveforms.  Additional duplex added confirms occlusion of the SFA from the region and that the stents are also occluded.   Review of Systems  Cardiovascular:       Claudication  All other systems reviewed and are negative.      Objective:   Physical Exam Vitals reviewed.  HENT:     Head: Normocephalic.  Cardiovascular:     Rate and Rhythm: Normal rate.     Pulses: Normal pulses.  Pulmonary:     Effort: Pulmonary effort is normal.  Neurological:     Mental Status: He is alert and oriented to person, place, and time.  Psychiatric:        Mood and Affect: Mood normal.        Behavior: Behavior normal.        Thought Content: Thought content normal.        Judgment: Judgment normal.     BP (!) 163/83   Pulse 80   Ht 5\' 11"  (1.803 m)   Wt 222 lb (100.7 kg)   BMI 30.96 kg/m   Past Medical History:  Diagnosis Date  . Bipolar disorder ([place])   . Hyperlipidemia   . PAD (peripheral artery disease) ([place])     Social  History   Socioeconomic History  . Marital status: Married    Spouse name: Not on file  . Number of children: Not on file  . Years of education: Not on file  . Highest education level: Not on file  Occupational History  . Not on file  Tobacco Use  . Smoking status: Current Some Day Smoker    Packs/day: 1.00  . Smokeless tobacco: Never Used  Substance and Sexual Activity  . Alcohol use: Yes    Comment: quite often max 3 per night  . Drug use: Not on file  . Sexual activity: Not on file  Other Topics Concern  . Not on file  Social History Narrative  . Not on file   Social Determinants of Health   Financial  Resource Strain: Not on file  Food Insecurity: Not on file  Transportation Needs: Not on file  Physical Activity: Not on file  Stress: Not on file  Social Connections: Not on file  Intimate Partner Violence: Not on file    Past Surgical History:  Procedure Laterality Date  . LOWER EXTREMITY ANGIOGRAPHY Right 10/22/2020   Procedure: LOWER EXTREMITY ANGIOGRAPHY;  Surgeon: Algernon Huxley, MD;  Location: [facility];  Service: Cardiovascular;  Laterality: Right;  . LOWER EXTREMITY ANGIOGRAPHY Left 11/01/2020   Procedure: LOWER EXTREMITY ANGIOGRAPHY;  Surgeon: Algernon Huxley, MD;  Location: [facility];  Service: Cardiovascular;  Laterality: Left;  Marland Kitchen VASCULAR SURGERY     stents on LLE and RLE    History reviewed. No pertinent family history.  No Known Allergies  No flowsheet data found.    CMP     Component Value Date/Time   BUN 19 11/01/2020 0721   CREATININE 1.38 (H) 11/01/2020 0721   GFRNONAA 53 (L) 11/01/2020 0721     ABI WITH/WO TBI  Result Date: 10/16/2020 LOWER EXTREMITY DOPPLER STUDY Indications: Claudication.  Performing Technologist: Charlane Ferretti RT (R)(VS)  Examination Guidelines: A complete evaluation includes at minimum, Doppler waveform signals and systolic blood pressure reading at the level of bilateral brachial, anterior tibial, and posterior tibial arteries, when vessel segments are accessible. Bilateral testing is considered an integral part of a complete examination. Photoelectric Plethysmograph (PPG) waveforms and toe systolic pressure readings are included as required and additional duplex testing as needed. Limited examinations for reoccurring indications may be performed as noted.  ABI Findings: +---------+------------------+-----+----------+--------+ Right    Rt Pressure (mmHg)IndexWaveform  Comment  +---------+------------------+-----+----------+--------+ Brachial 169                                        +---------+------------------+-----+----------+--------+ ATA      97                0.57 monophasic         +---------+------------------+-----+----------+--------+ PTA      101               0.59 monophasic         +---------+------------------+-----+----------+--------+ Great Toe71                0.42 Abnormal           +---------+------------------+-----+----------+--------+ +---------+------------------+-----+----------+-------+ Left     Lt Pressure (mmHg)IndexWaveform  Comment +---------+------------------+-----+----------+-------+ Brachial 171                                      +---------+------------------+-----+----------+-------+  ATA      112               0.65 monophasic        +---------+------------------+-----+----------+-------+ PTA      113               0.66 monophasic        +---------+------------------+-----+----------+-------+ Great Toe110               0.64 Abnormal          +---------+------------------+-----+----------+-------+ Summary: Right: Resting right ankle-brachial index indicates moderate right lower extremity arterial disease. The right toe-brachial index is abnormal. Left: Resting left ankle-brachial index indicates moderate left lower extremity arterial disease. The left toe-brachial index is abnormal. *See table(s) above for measurements and observations.  Electronically signed by [name] MD on 10/16/2020 at 11:51:16 AM.   Final        Assessment & Plan:   1. Atherosclerosis of native artery of both lower extremities with intermittent claudication (HCC) Recommend:  The patient has experienced increased symptoms and is now describing lifestyle limiting claudication and mild rest pain.   Given the severity of the patient's lower extremity symptoms the patient should undergo angiography and intervention.  Risk and benefits were reviewed the patient.  Indications for the procedure were reviewed.  All questions were answered,  the patient agrees to proceed.   The patient should continue walking and begin a more formal exercise program.  The patient should continue antiplatelet therapy and aggressive treatment of the lipid abnormalities  The patient will follow up with me after the angiogram.   2. 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  Hgb A1C to be monitored as already arranged by primary service   3. Combined hyperlipidemia Continue statin as ordered and reviewed, no changes at this time   4. Tobacco use disorder Smoking cessation was discussed, 3-10 minutes spent on this topic specifically    Current Outpatient Medications on File Prior to Visit  Medication Sig Dispense Refill  . aspirin 81 MG EC tablet Take 81 mg by mouth daily.    . clopidogrel (PLAVIX) 75 MG tablet Take 1 tablet (75 mg total) by mouth daily. 30 tablet 11  . furosemide (LASIX) 20 MG tablet Take 20 mg by mouth daily.    Marland Kitchen lithium carbonate 300 MG capsule Take 300 mg by mouth 2 (two) times daily.    Marland Kitchen PARoxetine (PAXIL) 20 MG tablet Take 20 mg by mouth daily.    . QUEtiapine (SEROQUEL) 200 MG tablet Take 200 mg by mouth at bedtime.    . simvastatin (ZOCOR) 20 MG tablet Take 20 mg by mouth daily.     No current facility-administered medications on file prior to visit.    There are no Patient Instructions on file for this visit. No follow-ups on file.   Kris Hartmann, NP

## 2020-12-04 NOTE — H&P (View-Only) (Signed)
Subjective:    Patient ID: Keith Levy, male    DOB: Jun 06, 1941, 80 y.o.   MRN: [id number] Chief Complaint  Patient presents with  . Follow-up    4 wk [facility] post le angio abi     The patient returns to the office for followup and review status post angiogram with intervention. The patient notes improvement in the lower extremity symptoms however the symptoms recently returned.  There is interval shortening of the patient's claudication distance or rest pain symptoms.   No new ulcers or wounds have occurred since the last visit.  The patient underwent intervention on 11/01/2020 including:  Procedure(s) Performed: 1. Ultrasound guidance for vascular access right femoral artery 2. Catheter placement into left common femoral artery from right femoral approach 3. Aortogram and selective left lower extremity angiogram 4. Percutaneous transluminal angioplasty of right distal common and proximal external iliac artery with 6 mm diameter Lutonix drug-coated angioplasty balloon 5. Mechanical thrombectomy with the roto-Rex device to the left distal SFA and proximal popliteal artery             6. Balloon angioplasty of the distal left SFA and above-knee popliteal artery with 5 mm diameter by 15 cm length Lutonix drug-coated angioplasty balloon             7. Viabahn stent placement to the distal left SFA and above-knee popliteal artery with 6 mm diameter by 15 cm length stent for residual stenosis after angioplasty             8. Stent placement to the distal right common and proximal right external iliac artery with 8 mm diameter by 6 cm length life star stent 9. StarClose closure device right femoral artery  There have been no significant changes to the patient's overall health care.  The patient denies amaurosis fugax or recent TIA symptoms. There are no recent neurological changes noted. The patient denies history  of DVT, PE or superficial thrombophlebitis. The patient denies recent episodes of angina or shortness of breath.   ABI's Rt=0.73 and Lt=0.98  (previous ABI's Rt=0.59 and Lt=0.66) Duplex US of the right tibial arteries reveals monophasic waveforms with dampened toe waveforms.  The left lower extremity has biphasic waveforms with normal toe waveforms.  Additional duplex added confirms occlusion of the SFA from the region and that the stents are also occluded.   Review of Systems  Cardiovascular:       Claudication  All other systems reviewed and are negative.      Objective:   Physical Exam Vitals reviewed.  HENT:     Head: Normocephalic.  Cardiovascular:     Rate and Rhythm: Normal rate.     Pulses: Normal pulses.  Pulmonary:     Effort: Pulmonary effort is normal.  Neurological:     Mental Status: Keith Levy is alert and oriented to person, place, and time.  Psychiatric:        Mood and Affect: Mood normal.        Behavior: Behavior normal.        Thought Content: Thought content normal.        Judgment: Judgment normal.     BP (!) 163/83   Pulse 80   Ht 5\' 11"  (1.803 m)   Wt 222 lb (100.7 kg)   BMI 30.96 kg/m   Past Medical History:  Diagnosis Date  . Bipolar disorder ([place])   . Hyperlipidemia   . PAD (peripheral artery disease) ([place])     Social  History   Socioeconomic History  . Marital status: Married    Spouse name: Not on file  . Number of children: Not on file  . Years of education: Not on file  . Highest education level: Not on file  Occupational History  . Not on file  Tobacco Use  . Smoking status: Current Some Day Smoker    Packs/day: 1.00  . Smokeless tobacco: Never Used  Substance and Sexual Activity  . Alcohol use: Yes    Comment: quite often max 3 per night  . Drug use: Not on file  . Sexual activity: Not on file  Other Topics Concern  . Not on file  Social History Narrative  . Not on file   Social Determinants of Health   Financial  Resource Strain: Not on file  Food Insecurity: Not on file  Transportation Needs: Not on file  Physical Activity: Not on file  Stress: Not on file  Social Connections: Not on file  Intimate Partner Violence: Not on file    Past Surgical History:  Procedure Laterality Date  . LOWER EXTREMITY ANGIOGRAPHY Right 10/22/2020   Procedure: LOWER EXTREMITY ANGIOGRAPHY;  Surgeon: Algernon Huxley, MD;  Location: [facility];  Service: Cardiovascular;  Laterality: Right;  . LOWER EXTREMITY ANGIOGRAPHY Left 11/01/2020   Procedure: LOWER EXTREMITY ANGIOGRAPHY;  Surgeon: Algernon Huxley, MD;  Location: [facility];  Service: Cardiovascular;  Laterality: Left;  Marland Kitchen VASCULAR SURGERY     stents on LLE and RLE    History reviewed. No pertinent family history.  No Known Allergies  No flowsheet data found.    CMP     Component Value Date/Time   BUN 19 11/01/2020 0721   CREATININE 1.38 (H) 11/01/2020 0721   GFRNONAA 53 (L) 11/01/2020 0721     ABI WITH/WO TBI  Result Date: 10/16/2020 LOWER EXTREMITY DOPPLER STUDY Indications: Claudication.  Performing Technologist: Charlane Ferretti RT (R)(VS)  Examination Guidelines: A complete evaluation includes at minimum, Doppler waveform signals and systolic blood pressure reading at the level of bilateral brachial, anterior tibial, and posterior tibial arteries, when vessel segments are accessible. Bilateral testing is considered an integral part of a complete examination. Photoelectric Plethysmograph (PPG) waveforms and toe systolic pressure readings are included as required and additional duplex testing as needed. Limited examinations for reoccurring indications may be performed as noted.  ABI Findings: +---------+------------------+-----+----------+--------+ Right    Rt Pressure (mmHg)IndexWaveform  Comment  +---------+------------------+-----+----------+--------+ Brachial 169                                        +---------+------------------+-----+----------+--------+ ATA      97                0.57 monophasic         +---------+------------------+-----+----------+--------+ PTA      101               0.59 monophasic         +---------+------------------+-----+----------+--------+ Great Toe71                0.42 Abnormal           +---------+------------------+-----+----------+--------+ +---------+------------------+-----+----------+-------+ Left     Lt Pressure (mmHg)IndexWaveform  Comment +---------+------------------+-----+----------+-------+ Brachial 171                                      +---------+------------------+-----+----------+-------+  ATA      112               0.65 monophasic        +---------+------------------+-----+----------+-------+ PTA      113               0.66 monophasic        +---------+------------------+-----+----------+-------+ Great Toe110               0.64 Abnormal          +---------+------------------+-----+----------+-------+ Summary: Right: Resting right ankle-brachial index indicates moderate right lower extremity arterial disease. The right toe-brachial index is abnormal. Left: Resting left ankle-brachial index indicates moderate left lower extremity arterial disease. The left toe-brachial index is abnormal. *See table(s) above for measurements and observations.  Electronically signed by [name] MD on 10/16/2020 at 11:51:16 AM.   Final        Assessment & Plan:   1. Atherosclerosis of native artery of both lower extremities with intermittent claudication (HCC) Recommend:  The patient has experienced increased symptoms and is now describing lifestyle limiting claudication and mild rest pain.   Given the severity of the patient's lower extremity symptoms the patient should undergo angiography and intervention.  Risk and benefits were reviewed the patient.  Indications for the procedure were reviewed.  All questions were answered,  the patient agrees to proceed.   The patient should continue walking and begin a more formal exercise program.  The patient should continue antiplatelet therapy and aggressive treatment of the lipid abnormalities  The patient will follow up with me after the angiogram.   2. 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  Hgb A1C to be monitored as already arranged by primary service   3. Combined hyperlipidemia Continue statin as ordered and reviewed, no changes at this time   4. Tobacco use disorder Smoking cessation was discussed, 3-10 minutes spent on this topic specifically    Current Outpatient Medications on File Prior to Visit  Medication Sig Dispense Refill  . aspirin 81 MG EC tablet Take 81 mg by mouth daily.    . clopidogrel (PLAVIX) 75 MG tablet Take 1 tablet (75 mg total) by mouth daily. 30 tablet 11  . furosemide (LASIX) 20 MG tablet Take 20 mg by mouth daily.    Marland Kitchen lithium carbonate 300 MG capsule Take 300 mg by mouth 2 (two) times daily.    Marland Kitchen PARoxetine (PAXIL) 20 MG tablet Take 20 mg by mouth daily.    . QUEtiapine (SEROQUEL) 200 MG tablet Take 200 mg by mouth at bedtime.    . simvastatin (ZOCOR) 20 MG tablet Take 20 mg by mouth daily.     No current facility-administered medications on file prior to visit.    There are no Patient Instructions on file for this visit. No follow-ups on file.   Kris Hartmann, NP

## 2020-12-05 ENCOUNTER — Other Ambulatory Visit (INDEPENDENT_AMBULATORY_CARE_PROVIDER_SITE_OTHER): Payer: Self-pay | Admitting: Nurse Practitioner

## 2020-12-06 ENCOUNTER — Ambulatory Visit
Admission: RE | Admit: 2020-12-06 | Discharge: 2020-12-06 | Disposition: A | Discharge status: Home / Self Care | Payer: Medicare Other | Attending: Vascular Surgery | Admitting: Vascular Surgery

## 2020-12-06 ENCOUNTER — Encounter
Admission: RE | Disposition: A | Discharge status: Home / Self Care | Payer: Self-pay | Source: Home / Self Care | Attending: Vascular Surgery

## 2020-12-06 ENCOUNTER — Encounter: Payer: Self-pay | Admitting: Vascular Surgery

## 2020-12-06 ENCOUNTER — Other Ambulatory Visit: Payer: Self-pay

## 2020-12-06 DIAGNOSIS — Z7982 Long term (current) use of aspirin: Secondary | ICD-10-CM | POA: Diagnosis not present

## 2020-12-06 DIAGNOSIS — F17210 Nicotine dependence, cigarettes, uncomplicated: Secondary | ICD-10-CM | POA: Diagnosis not present

## 2020-12-06 DIAGNOSIS — E1142 Type 2 diabetes mellitus with diabetic polyneuropathy: Secondary | ICD-10-CM | POA: Diagnosis not present

## 2020-12-06 DIAGNOSIS — E1151 Type 2 diabetes mellitus with diabetic peripheral angiopathy without gangrene: Secondary | ICD-10-CM | POA: Insufficient documentation

## 2020-12-06 DIAGNOSIS — I70229 Atherosclerosis of native arteries of extremities with rest pain, unspecified extremity: Secondary | ICD-10-CM

## 2020-12-06 DIAGNOSIS — Z7902 Long term (current) use of antithrombotics/antiplatelets: Secondary | ICD-10-CM | POA: Diagnosis not present

## 2020-12-06 DIAGNOSIS — I70213 Atherosclerosis of native arteries of extremities with intermittent claudication, bilateral legs: Secondary | ICD-10-CM | POA: Insufficient documentation

## 2020-12-06 DIAGNOSIS — E782 Mixed hyperlipidemia: Secondary | ICD-10-CM | POA: Insufficient documentation

## 2020-12-06 DIAGNOSIS — Z79899 Other long term (current) drug therapy: Secondary | ICD-10-CM | POA: Diagnosis not present

## 2020-12-06 HISTORY — PX: LOWER EXTREMITY ANGIOGRAPHY: CATH118251

## 2020-12-06 LAB — CREATININE, SERUM
Creatinine, Ser: 1.58 mg/dL — ABNORMAL HIGH (ref 0.61–1.24)
GFR, Estimated: 45 mL/min — ABNORMAL LOW (ref >60)

## 2020-12-06 LAB — BUN: BUN: 23 mg/dL (ref 8–23)

## 2020-12-06 SURGERY — LOWER EXTREMITY ANGIOGRAPHY
Anesthesia: Moderate Sedation | Site: Leg Lower | Laterality: Right

## 2020-12-06 MED ORDER — SODIUM CHLORIDE 0.9% FLUSH: 3.0000 mL | INTRAVENOUS | Status: DC | PRN

## 2020-12-06 MED ORDER — FENTANYL CITRATE (PF) 100 MCG/2ML IJ SOLN
INTRAMUSCULAR | Status: DC | PRN
  Administered 2020-12-06: 25 ug via INTRAVENOUS
  Administered 2020-12-06: 50 ug via INTRAVENOUS
  Administered 2020-12-06: 25 ug via INTRAVENOUS

## 2020-12-06 MED ORDER — ONDANSETRON HCL 4 MG/2ML IJ SOLN: 4.0000 mg | Freq: Four times a day (QID) | INTRAMUSCULAR | Status: DC | PRN

## 2020-12-06 MED ORDER — FENTANYL CITRATE (PF) 100 MCG/2ML IJ SOLN
INTRAMUSCULAR | Status: AC
  Filled 2020-12-06: qty 2

## 2020-12-06 MED ORDER — METHYLPREDNISOLONE SODIUM SUCC 125 MG IJ SOLR: 125.0000 mg | Freq: Once | INTRAMUSCULAR | Status: DC | PRN

## 2020-12-06 MED ORDER — IODIXANOL 320 MG/ML IV SOLN
INTRAVENOUS | Status: DC | PRN
  Administered 2020-12-06: 60 mL

## 2020-12-06 MED ORDER — MIDAZOLAM HCL 5 MG/5ML IJ SOLN
INTRAMUSCULAR | Status: AC
  Filled 2020-12-06: qty 5

## 2020-12-06 MED ORDER — DIPHENHYDRAMINE HCL 50 MG/ML IJ SOLN: 50.0000 mg | Freq: Once | INTRAMUSCULAR | Status: DC | PRN

## 2020-12-06 MED ORDER — LABETALOL HCL 5 MG/ML IV SOLN: 10.0000 mg | INTRAVENOUS | Status: DC | PRN

## 2020-12-06 MED ORDER — ACETAMINOPHEN 325 MG PO TABS: 650.0000 mg | ORAL_TABLET | ORAL | Status: DC | PRN

## 2020-12-06 MED ORDER — HYDRALAZINE HCL 20 MG/ML IJ SOLN: 5.0000 mg | INTRAMUSCULAR | Status: DC | PRN

## 2020-12-06 MED ORDER — MIDAZOLAM HCL 2 MG/ML PO SYRP: 8.0000 mg | ORAL_SOLUTION | Freq: Once | ORAL | Status: DC | PRN

## 2020-12-06 MED ORDER — SODIUM CHLORIDE 0.9 % IV SOLN: INTRAVENOUS | Status: DC

## 2020-12-06 MED ORDER — CEFAZOLIN SODIUM-DEXTROSE 2-4 GM/100ML-% IV SOLN
2.0000 g | Freq: Once | INTRAVENOUS | Status: AC
  Administered 2020-12-06: 2 g via INTRAVENOUS

## 2020-12-06 MED ORDER — SODIUM CHLORIDE 0.9% FLUSH: 3.0000 mL | Freq: Two times a day (BID) | INTRAVENOUS | Status: DC

## 2020-12-06 MED ORDER — HEPARIN SODIUM (PORCINE) 1000 UNIT/ML IJ SOLN
INTRAMUSCULAR | Status: AC
  Filled 2020-12-06: qty 1

## 2020-12-06 MED ORDER — FAMOTIDINE 20 MG PO TABS: 40.0000 mg | ORAL_TABLET | Freq: Once | ORAL | Status: DC | PRN

## 2020-12-06 MED ORDER — HEPARIN SODIUM (PORCINE) 1000 UNIT/ML IJ SOLN
INTRAMUSCULAR | Status: DC | PRN
  Administered 2020-12-06: 5000 [IU] via INTRAVENOUS

## 2020-12-06 MED ORDER — HYDROMORPHONE HCL 1 MG/ML IJ SOLN: 1.0000 mg | Freq: Once | INTRAMUSCULAR | Status: DC | PRN

## 2020-12-06 MED ORDER — SODIUM CHLORIDE 0.9 % IV SOLN
INTRAVENOUS | Status: DC
  Administered 2020-12-06: 300 mL via INTRAVENOUS

## 2020-12-06 MED ORDER — MIDAZOLAM HCL 2 MG/2ML IJ SOLN
INTRAMUSCULAR | Status: DC | PRN
  Administered 2020-12-06 (×2): 1 mg via INTRAVENOUS
  Administered 2020-12-06: 2 mg via INTRAVENOUS

## 2020-12-06 MED ORDER — SODIUM CHLORIDE 0.9 % IV SOLN: 250.0000 mL | INTRAVENOUS | Status: DC | PRN

## 2020-12-06 SURGICAL SUPPLY — 32 items
BALLN DORADO 5X200X135 (BALLOONS) ×2
BALLN DORADO 6X200X135 (BALLOONS) ×2
BALLN DORADO 7X200X80 (BALLOONS) ×2
BALLN ULTRVRSE 018 5X40X150 (BALLOONS) ×2
BALLN ULTRVRSE 4X300X130 (BALLOONS) ×2
BALLOON DORADO 5X200X135 (BALLOONS) ×1 IMPLANT
BALLOON DORADO 6X200X135 (BALLOONS) ×1 IMPLANT
BALLOON DORADO 7X200X80 (BALLOONS) IMPLANT
BALLOON ULTRVRSE 018 5X40X150 (BALLOONS) IMPLANT
BALLOON ULTRVRSE 4X300X130 (BALLOONS) IMPLANT
CATH ANGIO 5F PIGTAIL 65CM (CATHETERS) ×1 IMPLANT
CATH BEACON 5 .035 65 KMP TIP (CATHETERS) ×1 IMPLANT
CATH CXI SUPP ST 4FR 90CM (CATHETERS) ×1 IMPLANT
CATH NAVICROSS ANGLED 135CM (MICROCATHETER) ×1 IMPLANT
CATH SEEKER .035X150CM (CATHETERS) ×2 IMPLANT
COVER PROBE U/S 5X48 (MISCELLANEOUS) ×2 IMPLANT
DEVICE STARCLOSE SE CLOSURE (Vascular Products) ×1 IMPLANT
DEVICE TORQUE (MISCELLANEOUS) ×1 IMPLANT
GLIDEWIRE ADV .035X260CM (WIRE) ×1 IMPLANT
GLIDEWIRE STIFF .35X180X3 HYDR (WIRE) ×1 IMPLANT
GUIDEWIRE SUPER STIFF .035X180 (WIRE) ×2 IMPLANT
KIT ENCORE 26 ADVANTAGE (KITS) ×1 IMPLANT
KIT MICROPUNCTURE NIT STIFF (SHEATH) ×1 IMPLANT
PACK ANGIOGRAPHY (CUSTOM PROCEDURE TRAY) ×2 IMPLANT
SHEATH ANL2 6FRX45 HC (SHEATH) ×1 IMPLANT
SHEATH BRITE TIP 5FRX11 (SHEATH) ×1 IMPLANT
STENT VIABAHN 6X150X120 (Permanent Stent) ×2 IMPLANT
STENT VIABAHN 6X250X120 (Permanent Stent) ×1 IMPLANT
SYR MEDRAD MARK 7 150ML (SYRINGE) ×1 IMPLANT
TUBING CONTRAST HIGH PRESS 72 (TUBING) ×4 IMPLANT
WIRE G V18X300CM (WIRE) ×1 IMPLANT
WIRE GUIDERIGHT .035X150 (WIRE) ×1 IMPLANT

## 2020-12-06 NOTE — Progress Notes (Signed)
Pt is confused as to which leg is to be examined. Pt states he has trouble with the left leg and not the right. Pt just had his left leg treated. Dr Lucky Cowboy notified to come and speak with pt before signing consent.

## 2020-12-06 NOTE — Interval H&P Note (Signed)
History and Physical Interval Note:  12/06/2020 8:52 AM  Keith Levy  has presented today for surgery, with the diagnosis of RT LE angio  ASO w rest pain Covid  March 22.  The various methods of treatment have been discussed with the patient and family. After consideration of risks, benefits and other options for treatment, the patient has consented to  Procedure(s): LOWER EXTREMITY ANGIOGRAPHY (Right) as a surgical intervention.  The patient's history has been reviewed, patient examined, no change in status, stable for surgery.  I have reviewed the patient's chart and labs.  Questions were answered to the patient's satisfaction.     [name]

## 2020-12-06 NOTE — Discharge Instructions (Signed)
Dr Ozella Almond office will call with follow up visit.   Femoral Site Care  This sheet gives you information about how to care for yourself after your procedure. Your health care provider may also give you more specific instructions. If you have problems or questions, contact your health care provider. What can I expect after the procedure? After the procedure, it is common to have:  Bruising that usually fades within 1-2 weeks.  Tenderness at the site. Follow these instructions at home: Wound care  Follow instructions from your health care provider about how to take care of your insertion site. Make sure you: ? Wash your hands with soap and water before you change your bandage (dressing). If soap and water are not available, use hand sanitizer. ? Change your dressing as told by your health care provider. ? Leave stitches (sutures), skin glue, or adhesive strips in place. These skin closures may need to stay in place for 2 weeks or longer. If adhesive strip edges start to loosen and curl up, you may trim the loose edges. Do not remove adhesive strips completely unless your health care provider tells you to do that.  Do not take baths, swim, or use a hot tub until your health care provider approves.  You may shower 24-48 hours after the procedure or as told by your health care provider. ? Gently wash the site with plain soap and water. ? Pat the area dry with a clean towel. ? Do not rub the site. This may cause bleeding.  Do not apply powder or lotion to the site. Keep the site clean and dry.  Check your femoral site every day for signs of infection. Check for: ? Redness, swelling, or pain. ? Fluid or blood. ? Warmth. ? Pus or a bad smell. Activity  For the first 2-3 days after your procedure, or as long as directed: ? Avoid climbing stairs as much as possible. ? Do not squat.  Do not lift anything that is heavier than 10 lb (4.5 kg), or the limit that you are told, until your health  care provider says that it is safe.  Rest as directed. ? Avoid sitting for a long time without moving. Get up to take short walks every 1-2 hours.  Do not drive for 24 hours if you were given a medicine to help you relax (sedative). General instructions  Take over-the-counter and prescription medicines only as told by your health care provider.  Keep all follow-up visits as told by your health care provider. This is important. Contact a health care provider if you have:  A fever or chills.  You have redness, swelling, or pain around your insertion site. Get help right away if:  The catheter insertion area swells very fast.  You pass out.  You suddenly start to sweat or your skin gets clammy.  The catheter insertion area is bleeding, and the bleeding does not stop when you hold steady pressure on the area.  The area near or just beyond the catheter insertion site becomes pale, cool, tingly, or numb. These symptoms may represent a serious problem that is an emergency. Do not wait to see if the symptoms will go away. Get medical help right away. Call your local emergency services (911 in the [place] Do not drive yourself to the hospital. Summary  After the procedure, it is common to have bruising that usually fades within 1-2 weeks.  Check your femoral site every day for signs of infection.  Do not  lift anything that is heavier than 10 lb (4.5 kg), or the limit that you are told, until your health care provider says that it is safe. This information is not intended to replace advice given to you by your health care provider. Make sure you discuss any questions you have with your health care provider. Document Revised: 05/04/2020 Document Reviewed: 05/04/2020 Elsevier Patient Education  2021 [place] After This sheet gives you information about how to care for yourself after your procedure. Your doctor may also give you more specific instructions. If you have  problems or questions, contact your doctor. What can I expect after the procedure? After the procedure, it is common to have these problems at the point where the catheter was inserted:  Bruising.  Tenderness.  A collection of blood (hematoma). This may feel like a small lump under the skin at the insertion site. Follow these instructions at home: Insertion site care  Follow instructions from your doctor about how to take care of the area where the catheter was inserted. Make sure you: ? Wash your hands with soap and water before you change your bandage (dressing). If you cannot use soap and water, use hand sanitizer. ? Change your bandage as told by your doctor.  Do not take baths, swim, or use a hot tub until your doctor says it is okay.  You may shower 24-48 hours after the procedure, or as told by your doctor. To clean the area: ? Gently wash the area with plain soap and water. ? Pat the area dry with a clean towel. ? Do not rub the area. This may cause bleeding.  Check your insertion area every day for signs of infection. Check for: ? Redness, swelling, or pain. ? Fluid or blood. ? Warmth. ? Pus or a bad smell.  Do not apply powder or lotion to the area. Keep the area clean and dry.   Activity  Do not drive for 24 hours if you were given a medicine to help you relax (sedative).  Rest as told by your doctor, usually for 1-2 days.  Do not lift anything that is heavier than 10 lbs. (4.5 kg) or as told by your doctor.  If your insertion site was in your leg, try to avoid stairs for a few days.  Return to your normal activities as told by your doctor. Ask your doctor what activities are safe for you. General instructions  If the insertion area starts to bleed, lie flat and put pressure on the area. If the bleeding does not stop, get help right away. This is an emergency.  Take over-the-counter and prescription medicines only as told by your doctor.  Drink enough fluid  to keep your pee (urine) pale yellow.  Keep all follow-up visits as told by your doctor. This is important.   Contact a doctor if:  You have a fever.  You have chills.  You have redness, swelling, or pain around your insertion area.  You have fluid or blood coming from your insertion area.  The insertion area feels warm to the touch.  You have pus or a bad smell coming from your insertion area.  You have more bruising around the insertion area. Get help right away if:  You have a lot of pain in the insertion area.  The insertion area swells very fast.  The insertion area is bleeding, and the bleeding does not stop after you hold steady pressure on the area.  The area  around the insertion area becomes pale, cool, tingly, or numb.  You have chest pain.  You have trouble breathing.  You have a rash.  You have any signs of a stroke. "BE FAST" is an easy way to remember the main warning signs: ? B - Balance. Signs are dizziness, sudden trouble walking, or loss of balance. ? E - Eyes. Signs are trouble seeing or a change in how you see. ? F - Face. Signs are sudden weakness or loss of feeling of the face, or the face or eyelid drooping on one side. ? A - Arms. Signs are weakness or loss of feeling in an arm. This happens suddenly and usually on one side of the body. ? S - Speech. Signs are sudden trouble speaking, slurred speech, or trouble understanding what people say. ? T - Time. Time to call emergency services. Write down what time symptoms started.  You have other signs of a stroke, such as: ? A sudden, very bad headache with no known cause. ? Feeling like you may vomit (nausea). ? Vomiting. ? A seizure. These symptoms may be an emergency. Do not wait to see if the symptoms will go away. Get medical help right away. Call your local emergency services (911 in the [place] Do not drive yourself to the hospital. Summary  After the procedure, it is common to have bruising  and tenderness at the insertion area.  Do not take baths, swim, or use a hot tub until your doctor says it is okay to do so. You may shower 24-48 hours after the procedure or as told by your doctor.  It is important to rest and drink plenty of fluids.  If the insertion area starts to bleed, lie flat and put pressure on the area. If the bleeding does not stop, get help right away. This is an emergency. This information is not intended to replace advice given to you by your health care provider. Make sure you discuss any questions you have with your health care provider. Document Revised: 07/06/2019 Document Reviewed: 07/06/2019 Elsevier Patient Education  2021 [place].   Moderate Conscious Sedation, Adult, Care After This sheet gives you information about how to care for yourself after your procedure. Your health care provider may also give you more specific instructions. If you have problems or questions, contact your health care provider. What can I expect after the procedure? After the procedure, it is common to have:  Sleepiness for several hours.  Impaired judgment for several hours.  Difficulty with balance.  Vomiting if you eat too soon. Follow these instructions at home: For the time period you were told by your health care provider:  Rest.  Do not participate in activities where you could fall or become injured.  Do not drive or use machinery.  Do not drink alcohol.  Do not take sleeping pills or medicines that cause drowsiness.  Do not make important decisions or sign legal documents.  Do not take care of children on your own.      Eating and drinking  Follow the diet recommended by your health care provider.  Drink enough fluid to keep your urine pale yellow.  If you vomit: ? Drink water, juice, or soup when you can drink without vomiting. ? Make sure you have little or no nausea before eating solid foods.   General instructions  Take  over-the-counter and prescription medicines only as told by your health care provider.  Have a responsible adult stay with  you for the time you are told. It is important to have someone help care for you until you are awake and alert.  Do not smoke.  Keep all follow-up visits as told by your health care provider. This is important. Contact a health care provider if:  You are still sleepy or having trouble with balance after 24 hours.  You feel light-headed.  You keep feeling nauseous or you keep vomiting.  You develop a rash.  You have a fever.  You have redness or swelling around the IV site. Get help right away if:  You have trouble breathing.  You have new-onset confusion at home. Summary  After the procedure, it is common to feel sleepy, have impaired judgment, or feel nauseous if you eat too soon.  Rest after you get home. Know the things you should not do after the procedure.  Follow the diet recommended by your health care provider and drink enough fluid to keep your urine pale yellow.  Get help right away if you have trouble breathing or new-onset confusion at home. This information is not intended to replace advice given to you by your health care provider. Make sure you discuss any questions you have with your health care provider. Document Revised: 12/30/2019 Document Reviewed: 07/28/2019 Elsevier Patient Education  2021 [employer].

## 2020-12-06 NOTE — Op Note (Signed)
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  Ultrasound guidance for vascular access left femoral artery             2.  Catheter placement into right common femoral artery from left femoral approach             3.  Aortogram and selective right lower extremity angiogram             4.  Percutaneous transluminal angioplasty of the entire right SFA and above-knee popliteal artery with 4 and 5 mm diameter angioplasty balloons             5.   Viabahn stent placement x2 to the right SFA and proximal popliteal artery using a 6 mm diameter by 25 cm length and a 6 mm diameter by 15 cm length Viabahn stent is dilated with 6 and 7 mm diameter high-pressure angioplasty balloon  6.   StarClose closure device left femoral artery  EBL: 15 cc  Contrast: 60 cc  Fluoro Time: 23.1 minutes  Moderate Conscious Sedation Time: approximately 76 minutes using 4 mg of Versed and 100 mcg of Fentanyl              Indications:  Patient is a 80 y.o.male with disabling claudication symptoms in both lower extremities.  He has already undergone left lower extremity revascularization about a month ago. The patient has noninvasive study showing reduced perfusion bilaterally. The patient is brought in for angiography for further evaluation and potential treatment.  Risks and benefits are discussed and informed consent is obtained.   Procedure:  The patient was identified and appropriate procedural time out was performed.  The patient was then placed supine on the table and prepped and draped in the usual sterile fashion. Moderate conscious sedation was administered during a face to face encounter with the patient throughout the procedure with my supervision of  the RN administering medicines and monitoring the patient's vital signs, pulse oximetry, telemetry and mental status throughout from the start of the procedure until the patient was taken to the recovery room. Ultrasound was used to evaluate the left common femoral artery.  It was patent .  A digital ultrasound image was acquired.  A Seldinger needle was used to access the left common femoral artery under direct ultrasound guidance and a permanent image was performed.  A 0.035 J wire was advanced without resistance and a 5Fr sheath was placed.  Pigtail catheter was placed into the aorta and an AP aortogram was performed. Renal arteries are not well seen but this may have been catheter position.  They were seen previously on his study a few weeks ago.  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  The multiple previously placed iliac interventions were widely patent without significant stenosis on either side at this point. I then crossed the aortic bifurcation and advanced to the right femoral head. Selective right lower extremity angiogram was then performed. This demonstrated a flush occlusion of the right SFA with a stent up to very near the origin of the right SFA.  Common femoral artery had mild disease.  The SFA was occluded throughout and there was reconstitution down in the popliteal artery proximally.  The popliteal artery had reasonably  good flow and there was a normal tibial trifurcation with both the anterior tibial and posterior tibial arteries being continuous to the foot without obvious focal stenosis. It was felt that it was in the patient's best interest to proceed with intervention after these images to avoid a second procedure and a larger amount of contrast and fluoroscopy based off of the findings from the initial angiogram. The patient was systemically heparinized and a 6 [place] Ansell sheath was then placed over the [employer] wire. I then used a Kumpe catheter and the advantage wire to  tediously get into the flush occlusion of the right SFA.  The catheter was hanging out proximally, we had to exchange for a lower profile catheter and eventually used a CXI, a Nava cross, and a seeker to get down into the occluded stent.  The stent had multiple areas of fracture and one area with a complete 360 twist of the stent.  Crossing this was extremely tedious, but eventually I was able to get down into the below-knee popliteal artery and confirm intraluminal flow with the seeker catheter and the advantage wire.  We then ballooned the entire right SFA and proximal popliteal artery first with a 4 and then a 5 mm diameter angioplasty balloon but there remained minimal flow due to occlusion or near occlusion in the SFA and multiple levels.  I then exchanged for 0.018 wire and deployed to Viabahn stents.  The first was 6 mm in diameter by 25 cm in length and started in the proximal popliteal artery went up to the proximal to mid SFA.  Then a 6 mm diameter by 15 cm length Viabahn stent was deployed from the origin of the SFA bridging down to the lower Viabahn stent.  These were postdilated with 6 mm diameter high-pressure angioplasty balloons and then a 7 mm diameter high-pressure angioplasty balloon proximal segment where the 360 twist of the original stent was in where it was highly calcific.  Completion imaging showed markedly improved flow with less than 10% residual stenosis and brisk flow distally. I elected to terminate the procedure. The sheath was removed and StarClose closure device was deployed in the left femoral artery with excellent hemostatic result. The patient was taken to the recovery room in stable condition having tolerated the procedure well.  Findings:               Aortogram:  Renal arteries are not well seen but this may have been catheter position.  They were seen previously on his study a few weeks ago.  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  The multiple previously placed iliac  interventions were widely patent without significant stenosis on either side at this point.             Right lower Extremity:  This demonstrated a flush occlusion of the right SFA with a stent up to very near the origin of the right SFA.  Common femoral artery had mild disease.  The SFA was occluded throughout and there was reconstitution down in the popliteal artery proximally.  The popliteal artery had reasonably good flow and there was a normal tibial trifurcation with both the anterior tibial and posterior tibial arteries being continuous to the foot without obvious focal stenosis   Disposition: Patient was taken to the recovery room in stable condition having tolerated the procedure well.  Complications: None  [name] 12/06/2020 11:42 AM   This note was created with Dragon Medical transcription system. Any errors  in dictation are purely unintentional.

## 2021-01-01 ENCOUNTER — Other Ambulatory Visit (INDEPENDENT_AMBULATORY_CARE_PROVIDER_SITE_OTHER): Payer: Self-pay | Admitting: Vascular Surgery

## 2021-01-01 DIAGNOSIS — Z95820 Peripheral vascular angioplasty status with implants and grafts: Secondary | ICD-10-CM

## 2021-01-01 DIAGNOSIS — I70219 Atherosclerosis of native arteries of extremities with intermittent claudication, unspecified extremity: Secondary | ICD-10-CM

## 2021-01-04 ENCOUNTER — Ambulatory Visit (INDEPENDENT_AMBULATORY_CARE_PROVIDER_SITE_OTHER): Payer: Medicare Other | Admitting: Vascular Surgery

## 2021-01-04 ENCOUNTER — Ambulatory Visit (INDEPENDENT_AMBULATORY_CARE_PROVIDER_SITE_OTHER): Payer: Medicare Other

## 2021-01-04 ENCOUNTER — Other Ambulatory Visit: Payer: Self-pay

## 2021-01-04 ENCOUNTER — Encounter (INDEPENDENT_AMBULATORY_CARE_PROVIDER_SITE_OTHER): Payer: Self-pay | Admitting: Vascular Surgery

## 2021-01-04 VITALS — BP 171/90 | HR 76 | Ht 71.0 in | Wt 206.0 lb

## 2021-01-04 DIAGNOSIS — N183 Chronic kidney disease, stage 3 unspecified: Secondary | ICD-10-CM | POA: Diagnosis not present

## 2021-01-04 DIAGNOSIS — I70213 Atherosclerosis of native arteries of extremities with intermittent claudication, bilateral legs: Secondary | ICD-10-CM | POA: Diagnosis not present

## 2021-01-04 DIAGNOSIS — E1151 Type 2 diabetes mellitus with diabetic peripheral angiopathy without gangrene: Secondary | ICD-10-CM | POA: Diagnosis not present

## 2021-01-04 DIAGNOSIS — E782 Mixed hyperlipidemia: Secondary | ICD-10-CM | POA: Diagnosis not present

## 2021-01-04 DIAGNOSIS — I70219 Atherosclerosis of native arteries of extremities with intermittent claudication, unspecified extremity: Secondary | ICD-10-CM

## 2021-01-04 DIAGNOSIS — Z95820 Peripheral vascular angioplasty status with implants and grafts: Secondary | ICD-10-CM

## 2021-01-04 NOTE — Assessment & Plan Note (Signed)
ABIs today are 0.94 on the right and 0.97 on the left which are marked improvement after bilateral lower extremity extensive revascularization.  He is doing well.  He says his legs basically returned to normal.  He is able to walk again.  He will continue his current medical regimen.  Compression and ambulation will help his swelling of the left leg.  We will plan to see him back in about 3 to 4 months with noninvasive studies or sooner if problems develop in the interim.

## 2021-01-04 NOTE — Progress Notes (Signed)
MRN : [id number]  Keith Levy is a 80 y.o. (01-Mar-1941) male who presents with chief complaint of  Chief Complaint  Patient presents with  . Follow-up    4 wk [facility] post LE angio abi   .  History of Present Illness: Patient returns today in follow up of his PAD.  He has undergone extensive bilateral lower extremity revascularization procedures.  Overall, he has done well with this.  He says his legs have returned to feeling normal again.  He is able to walk and is gaining his stamina and strength back.  He is has chronic left leg swelling which is stable to slightly improved.  No access related complications.  Tolerating his medications. ABIs today are 0.94 on the right and 0.97 on the left which are marked improvement after bilateral lower extremity extensive revascularization.  Current Outpatient Medications  Medication Sig Dispense Refill  . aspirin 81 MG EC tablet Take 81 mg by mouth daily.    . clopidogrel (PLAVIX) 75 MG tablet Take 1 tablet (75 mg total) by mouth daily. 30 tablet 11  . furosemide (LASIX) 20 MG tablet Take 20 mg by mouth daily.    Marland Kitchen lithium carbonate 300 MG capsule Take 300 mg by mouth 2 (two) times daily.    Marland Kitchen PARoxetine (PAXIL) 20 MG tablet Take 20 mg by mouth daily.    . QUEtiapine (SEROQUEL) 200 MG tablet Take 200 mg by mouth at bedtime.    Marland Kitchen QUEtiapine (SEROQUEL) 50 MG tablet Take 50 mg by mouth at bedtime.    . simvastatin (ZOCOR) 20 MG tablet Take 20 mg by mouth daily.     No current facility-administered medications for this visit.    Past Medical History:  Diagnosis Date  . Bipolar disorder ([place])   . Hyperlipidemia   . PAD (peripheral artery disease) ([place])   Diabetes  Past Surgical History:  Procedure Laterality Date  . LOWER EXTREMITY ANGIOGRAPHY Right 10/22/2020   Procedure: LOWER EXTREMITY ANGIOGRAPHY;  Surgeon: Algernon Huxley, MD;  Location: [facility];  Service: Cardiovascular;  Laterality: Right;  . LOWER EXTREMITY ANGIOGRAPHY  Left 11/01/2020   Procedure: LOWER EXTREMITY ANGIOGRAPHY;  Surgeon: Algernon Huxley, MD;  Location: [facility];  Service: Cardiovascular;  Laterality: Left;  . LOWER EXTREMITY ANGIOGRAPHY Right 12/06/2020   Procedure: LOWER EXTREMITY ANGIOGRAPHY;  Surgeon: Algernon Huxley, MD;  Location: [facility];  Service: Cardiovascular;  Laterality: Right;  Marland Kitchen VASCULAR SURGERY     stents on LLE and RLE     Family History No bleeding disorders, clotting disorders, autoimmune diseases, or aneurysms.   Social History       Tobacco Use  . Smoking status: Current Some Day Smoker  . Smokeless tobacco: Never Used  Retired Lives alone   No Known Allergies    REVIEW OF SYSTEMS (Negative unless checked)  Constitutional: [] ?Weight loss  [] ?Fever  [] ?Chills Cardiac: [] ?Chest pain   [] ?Chest pressure   [] ?Palpitations   [] ?Shortness of breath when laying flat   [] ?Shortness of breath at rest   [] ?Shortness of breath with exertion. Vascular:  [x] ?Pain in legs with walking   [x] ?Pain in legs at rest   [x] ?Pain in legs when laying flat   [] ?Claudication   [] ?Pain in feet when walking  [] ?Pain in feet at rest  [] ?Pain in feet when laying flat   [] ?History of DVT   [] ?Phlebitis   [] ?Swelling in legs   [] ?Varicose veins   [] ?Non-healing  ulcers Pulmonary:   [] ?Uses home oxygen   [] ?Productive cough   [] ?Hemoptysis   [] ?Wheeze  [] ?COPD   [] ?Asthma Neurologic:  [] ?Dizziness  [] ?Blackouts   [] ?Seizures   [] ?History of stroke   [] ?History of TIA  [] ?Aphasia   [] ?Temporary blindness   [] ?Dysphagia   [] ?Weakness or numbness in arms   [x] ?Weakness or numbness in legs Musculoskeletal:  [x] ?Arthritis   [] ?Joint swelling   [] ?Joint pain   [] ?Low back pain Hematologic:  [] ?Easy bruising  [] ?Easy bleeding   [] ?Hypercoagulable state   [] ?Anemic  [] ?Hepatitis Gastrointestinal:  [] ?Blood in stool   [] ?Vomiting blood  [] ?Gastroesophageal reflux/heartburn   [] ?Abdominal pain Genitourinary:  [] ?Chronic kidney  disease   [] ?Difficult urination  [] ?Frequent urination  [] ?Burning with urination   [] ?Hematuria Skin:  [] ?Rashes   [] ?Ulcers   [] ?Wounds Psychological:  [x] ?History of anxiety   [x] ? History of major depression.   Physical Examination  BP (!) 171/90   Pulse 76   Ht 5\' 11"  (1.803 m)   Wt 206 lb (93.4 kg)   BMI 28.73 kg/m  Gen:  WD/WN, NAD Head: [place]/AT, No temporalis wasting. Ear/Nose/Throat: Hearing grossly intact, nares w/o erythema or drainage Eyes: Conjunctiva clear. Sclera non-icteric Neck: Supple.  Trachea midline Pulmonary:  Good air movement, no use of accessory muscles.  Cardiac: RRR, no JVD Vascular:  Vessel Right Left  Radial Palpable Palpable                          PT Palpable 1+ Palpable  DP Palpable 1+ Palpable   Gastrointestinal: soft, non-tender/non-distended. No guarding/reflex.  Musculoskeletal: M/S 5/5 throughout.  No deformity or atrophy. 1+ LLE edema. Neurologic: Sensation grossly intact in extremities.  Symmetrical.  Speech is fluent.  Psychiatric: Judgment intact, Mood & affect appropriate for pt's clinical situation. Dermatologic: No rashes or ulcers noted.  No cellulitis or open wounds.       Labs Recent Results (from the past 2160 hour(s))  SARS CORONAVIRUS 2 (TAT 6-24 HRS) Nasopharyngeal Nasopharyngeal Swab     Status: None   Collection Time: 10/19/20  8:33 AM   Specimen: Nasopharyngeal Swab  Result Value Ref Range   SARS Coronavirus 2 NEGATIVE NEGATIVE    Comment: (NOTE) SARS-CoV-2 target nucleic acids are NOT DETECTED.  The SARS-CoV-2 RNA is generally detectable in upper and lower respiratory specimens during the acute phase of infection. Negative results do not preclude SARS-CoV-2 infection, do not rule out co-infections with other pathogens, and should not be used as the sole basis for treatment or other patient management decisions. Negative results must be combined with clinical observations, patient history, and  epidemiological information. The expected result is Negative.  Fact Sheet for Patients: SugarRoll.be  Fact Sheet for Healthcare Providers: https://www.woods-mathews.com/  This test is not yet approved or cleared by the [place] FDA and  has been authorized for detection and/or diagnosis of SARS-CoV-2 by FDA under an Emergency Use Authorization (EUA). This EUA will remain  in effect (meaning this test can be used) for the duration of the COVID-19 declaration under Se ction 564(b)(1) of the Act, 21 U.S.C. section 360bbb-3(b)(1), unless the authorization is terminated or revoked sooner.  Performed at [facility], [place] [address], [place], [place] [address]   BUN     Status: Abnormal   Collection Time: 10/22/20 12:05 PM  Result Value Ref Range   BUN 24 (H) 8 - 23 mg/dL    Comment: Performed at [facility],  [place], [place] [address]  Creatinine, serum     Status: Abnormal   Collection Time: 10/22/20 12:05 PM  Result Value Ref Range   Creatinine, Ser 1.54 (H) 0.61 - 1.24 mg/dL   GFR, Estimated 46 (L) >60 mL/min    Comment: (NOTE) Calculated using the CKD-EPI Creatinine Equation (2021) Performed at [facility], [address] [place], [place] [address]   SARS CORONAVIRUS 2 (TAT 6-24 HRS) Nasopharyngeal Nasopharyngeal Swab     Status: None   Collection Time: 10/30/20  8:05 AM   Specimen: Nasopharyngeal Swab  Result Value Ref Range   SARS Coronavirus 2 NEGATIVE NEGATIVE    Comment: (NOTE) SARS-CoV-2 target nucleic acids are NOT DETECTED.  The SARS-CoV-2 RNA is generally detectable in upper and lower respiratory specimens during the acute phase of infection. Negative results do not preclude SARS-CoV-2 infection, do not rule out co-infections with other pathogens, and should not be used as the sole basis for treatment or other patient management decisions. Negative results must be combined  with clinical observations, patient history, and epidemiological information. The expected result is Negative.  Fact Sheet for Patients: SugarRoll.be  Fact Sheet for Healthcare Providers: https://www.woods-mathews.com/  This test is not yet approved or cleared by the [place] FDA and  has been authorized for detection and/or diagnosis of SARS-CoV-2 by FDA under an Emergency Use Authorization (EUA). This EUA will remain  in effect (meaning this test can be used) for the duration of the COVID-19 declaration under Se ction 564(b)(1) of the Act, 21 U.S.C. section 360bbb-3(b)(1), unless the authorization is terminated or revoked sooner.  Performed at [facility], [place] [address], [place], [place] [address]   BUN     Status: None   Collection Time: 11/01/20  7:21 AM  Result Value Ref Range   BUN 19 8 - 23 mg/dL    Comment: Performed at [facility], [address] [place], [place] [address]  Creatinine, serum     Status: Abnormal   Collection Time: 11/01/20  7:21 AM  Result Value Ref Range   Creatinine, Ser 1.38 (H) 0.61 - 1.24 mg/dL   GFR, Estimated 53 (L) >60 mL/min    Comment: (NOTE) Calculated using the CKD-EPI Creatinine Equation (2021) Performed at [facility], [address] [place], [place] [address]   SARS CORONAVIRUS 2 (TAT 6-24 HRS) Nasopharyngeal Nasopharyngeal Swab     Status: None   Collection Time: 12/04/20  8:14 AM   Specimen: Nasopharyngeal Swab  Result Value Ref Range   SARS Coronavirus 2 NEGATIVE NEGATIVE    Comment: (NOTE) SARS-CoV-2 target nucleic acids are NOT DETECTED.  The SARS-CoV-2 RNA is generally detectable in upper and lower respiratory specimens during the acute phase of infection. Negative results do not preclude SARS-CoV-2 infection, do not rule out co-infections with other pathogens, and should not be used as the sole basis for treatment or other patient management  decisions. Negative results must be combined with clinical observations, patient history, and epidemiological information. The expected result is Negative.  Fact Sheet for Patients: SugarRoll.be  Fact Sheet for Healthcare Providers: https://www.woods-mathews.com/  This test is not yet approved or cleared by the [place] FDA and  has been authorized for detection and/or diagnosis of SARS-CoV-2 by FDA under an Emergency Use Authorization (EUA). This EUA will remain  in effect (meaning this test can be used) for the duration of the COVID-19 declaration under Se ction 564(b)(1) of the Act, 21 U.S.C.  section 360bbb-3(b)(1), unless the authorization is terminated or revoked sooner.  Performed at [facility], [place] [address], [place], [place] [address]   BUN     Status: None   Collection Time: 12/06/20  9:10 AM  Result Value Ref Range   BUN 23 8 - 23 mg/dL    Comment: Performed at [facility], [address] [place], [place] [address]  Creatinine, serum     Status: Abnormal   Collection Time: 12/06/20  9:10 AM  Result Value Ref Range   Creatinine, Ser 1.58 (H) 0.61 - 1.24 mg/dL   GFR, Estimated 45 (L) >60 mL/min    Comment: (NOTE) Calculated using the CKD-EPI Creatinine Equation (2021) Performed at [facility], [address] [place], [place] [address]     Radiology PERIPHERAL VASCULAR CATHETERIZATION  Result Date: 12/06/2020 See op note   Assessment/Plan 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 Also, involved in wound healing. On appropriate medications.   CKD (chronic kidney disease) stage 3, GFR 30-59 ml/min (HCC) Hydrate and limit contrast with the procedure.  Combined hyperlipidemia lipid control important in reducing the progression of atherosclerotic disease. Continue statin  therapy  Atherosclerosis of native arteries of extremity with intermittent claudication (HCC) ABIs today are 0.94 on the right and 0.97 on the left which are marked improvement after bilateral lower extremity extensive revascularization.  He is doing well.  He says his legs basically returned to normal.  He is able to walk again.  He will continue his current medical regimen.  Compression and ambulation will help his swelling of the left leg.  We will plan to see him back in about 3 to 4 months with noninvasive studies or sooner if problems develop in the interim.    [name], MD  01/04/2021 11:29 AM    This note was created with Dragon medical transcription system.  Any errors from dictation are purely unintentional

## 2021-05-10 ENCOUNTER — Ambulatory Visit (INDEPENDENT_AMBULATORY_CARE_PROVIDER_SITE_OTHER): Payer: Medicare Other

## 2021-05-10 ENCOUNTER — Other Ambulatory Visit (INDEPENDENT_AMBULATORY_CARE_PROVIDER_SITE_OTHER): Payer: Self-pay | Admitting: Vascular Surgery

## 2021-05-10 ENCOUNTER — Other Ambulatory Visit: Payer: Self-pay

## 2021-05-10 ENCOUNTER — Other Ambulatory Visit: Payer: Self-pay | Admitting: Internal Medicine

## 2021-05-10 ENCOUNTER — Other Ambulatory Visit (INDEPENDENT_AMBULATORY_CARE_PROVIDER_SITE_OTHER): Payer: Medicare Other

## 2021-05-10 ENCOUNTER — Encounter (INDEPENDENT_AMBULATORY_CARE_PROVIDER_SITE_OTHER): Payer: Self-pay | Admitting: Vascular Surgery

## 2021-05-10 ENCOUNTER — Other Ambulatory Visit (HOSPITAL_COMMUNITY): Payer: Self-pay | Admitting: Internal Medicine

## 2021-05-10 ENCOUNTER — Ambulatory Visit (INDEPENDENT_AMBULATORY_CARE_PROVIDER_SITE_OTHER): Payer: Medicare Other | Admitting: Vascular Surgery

## 2021-05-10 VITALS — BP 132/65 | HR 70 | Resp 16 | Wt 172.0 lb

## 2021-05-10 DIAGNOSIS — F17200 Nicotine dependence, unspecified, uncomplicated: Secondary | ICD-10-CM

## 2021-05-10 DIAGNOSIS — R918 Other nonspecific abnormal finding of lung field: Secondary | ICD-10-CM

## 2021-05-10 DIAGNOSIS — I70221 Atherosclerosis of native arteries of extremities with rest pain, right leg: Secondary | ICD-10-CM | POA: Diagnosis not present

## 2021-05-10 DIAGNOSIS — N183 Chronic kidney disease, stage 3 unspecified: Secondary | ICD-10-CM | POA: Diagnosis not present

## 2021-05-10 DIAGNOSIS — R634 Abnormal weight loss: Secondary | ICD-10-CM

## 2021-05-10 DIAGNOSIS — I70213 Atherosclerosis of native arteries of extremities with intermittent claudication, bilateral legs: Secondary | ICD-10-CM

## 2021-05-10 DIAGNOSIS — E1151 Type 2 diabetes mellitus with diabetic peripheral angiopathy without gangrene: Secondary | ICD-10-CM | POA: Diagnosis not present

## 2021-05-10 DIAGNOSIS — F17210 Nicotine dependence, cigarettes, uncomplicated: Secondary | ICD-10-CM

## 2021-05-10 DIAGNOSIS — Z95820 Peripheral vascular angioplasty status with implants and grafts: Secondary | ICD-10-CM | POA: Diagnosis not present

## 2021-05-10 DIAGNOSIS — E782 Mixed hyperlipidemia: Secondary | ICD-10-CM | POA: Diagnosis not present

## 2021-05-10 NOTE — Assessment & Plan Note (Signed)
Not surprisingly, his ABIs today are markedly reduced at less than 0.2 on the right and 0.5 on the left.  This is a critical and limb threatening situation.  The patient understands that this is in large part due to his medical noncompliance, inactivity, and his heavy tobacco use.  We are going to try to revascularize him next week.  His daughter says she has to check her scheduled to see what day she can bring him.  Smoking cessation and increasing activity was strongly recommended.  They are agreeable to proceed with angiography.

## 2021-05-10 NOTE — Progress Notes (Signed)
MRN : [id number]  Keith Levy is a 80 y.o. (Jan 18, 1941) male who presents with chief complaint of  Chief Complaint  Patient presents with   Follow-up    Ultrasound follow up  .  History of Present Illness: Patient returns today in follow up of his PAD.  Since his last visit 4 months ago and his ABIs were normalized after bilateral lower extremity interventions, his daughter says the only time he gets off the couch is to go to the refrigerator and get a beer.  He smoking several packs of cigarettes a day.  Not surprisingly, his ABIs today are markedly reduced at less than 0.2 on the right and 0.5 on the left.  He is having severe rest pain on the right but no ulcerations.  On the left, he has some numbness and tingling but not true rest pain.  Current Outpatient Medications  Medication Sig Dispense Refill   aspirin 81 MG EC tablet Take 81 mg by mouth daily.     clopidogrel (PLAVIX) 75 MG tablet Take 1 tablet (75 mg total) by mouth daily. 30 tablet 11   furosemide (LASIX) 20 MG tablet Take 20 mg by mouth daily.     lithium carbonate 300 MG capsule Take 300 mg by mouth 2 (two) times daily.     PARoxetine (PAXIL) 20 MG tablet Take 20 mg by mouth daily.     QUEtiapine (SEROQUEL) 200 MG tablet Take 200 mg by mouth at bedtime.     QUEtiapine (SEROQUEL) 50 MG tablet Take 50 mg by mouth at bedtime.     simvastatin (ZOCOR) 20 MG tablet Take 20 mg by mouth daily.     No current facility-administered medications for this visit.    Past Medical History:  Diagnosis Date   Bipolar disorder ([place])    Hyperlipidemia    PAD (peripheral artery disease) ([place])     Past Surgical History:  Procedure Laterality Date   LOWER EXTREMITY ANGIOGRAPHY Right 10/22/2020   Procedure: LOWER EXTREMITY ANGIOGRAPHY;  Surgeon: Algernon Huxley, MD;  Location: [facility];  Service: Cardiovascular;  Laterality: Right;   LOWER EXTREMITY ANGIOGRAPHY Left 11/01/2020   Procedure: LOWER EXTREMITY ANGIOGRAPHY;   Surgeon: Algernon Huxley, MD;  Location: [facility];  Service: Cardiovascular;  Laterality: Left;   LOWER EXTREMITY ANGIOGRAPHY Right 12/06/2020   Procedure: LOWER EXTREMITY ANGIOGRAPHY;  Surgeon: Algernon Huxley, MD;  Location: [facility];  Service: Cardiovascular;  Laterality: Right;   VASCULAR SURGERY     stents on LLE and RLE     Social History   Tobacco Use   Smoking status: Every Day    Packs/day: 1.00    Types: Cigarettes   Smokeless tobacco: Never  Substance Use Topics   Alcohol use: Yes    Comment: quite often max 3 per night      Family History No bleeding disorders, clotting disorders, autoimmune diseases or aneurysms  No Known Allergies   REVIEW OF SYSTEMS (Negative unless checked)  Constitutional: '[]'$ Weight loss  '[]'$ Fever  '[]'$ Chills Cardiac: '[]'$ Chest pain   '[]'$ Chest pressure   '[]'$ Palpitations   '[]'$ Shortness of breath when laying flat   '[]'$ Shortness of breath at rest   '[]'$ Shortness of breath with exertion. Vascular:  '[x]'$ Pain in legs with walking   '[]'$ Pain in legs at rest   '[]'$ Pain in legs when laying flat   '[]'$ Claudication   '[]'$ Pain in feet when walking  '[x]'$ Pain in feet at rest  '[x]'$ Pain in feet  when laying flat   '[]'$ History of DVT   '[]'$ Phlebitis   '[x]'$ Swelling in legs   '[]'$ Varicose veins   '[]'$ Non-healing ulcers Pulmonary:   '[]'$ Uses home oxygen   '[]'$ Productive cough   '[]'$ Hemoptysis   '[]'$ Wheeze  '[]'$ COPD   '[]'$ Asthma Neurologic:  '[]'$ Dizziness  '[]'$ Blackouts   '[]'$ Seizures   '[]'$ History of stroke   '[]'$ History of TIA  '[]'$ Aphasia   '[]'$ Temporary blindness   '[]'$ Dysphagia   '[]'$ Weakness or numbness in arms   '[]'$ Weakness or numbness in legs Musculoskeletal:  '[x]'$ Arthritis   '[]'$ Joint swelling   '[x]'$ Joint pain   '[]'$ Low back pain Hematologic:  '[]'$ Easy bruising  '[]'$ Easy bleeding   '[]'$ Hypercoagulable state   '[]'$ Anemic   Gastrointestinal:  '[]'$ Blood in stool   '[]'$ Vomiting blood  '[]'$ Gastroesophageal reflux/heartburn   '[]'$ Abdominal pain Genitourinary:  '[]'$ Chronic kidney disease   '[]'$ Difficult urination  '[]'$ Frequent urination   '[]'$ Burning with urination   '[]'$ Hematuria Skin:  '[]'$ Rashes   '[]'$ Ulcers   '[]'$ Wounds Psychological:  '[]'$ History of anxiety   '[]'$  History of major depression.  Physical Examination  BP 132/65 (BP Location: Right Arm)   Pulse 70   Resp 16   Wt 172 lb (78 kg)   BMI 23.99 kg/m  Gen:  WD/WN, NAD Head: [place]/AT, No temporalis wasting. Ear/Nose/Throat: Hearing grossly intact, nares w/o erythema or drainage Eyes: Conjunctiva clear. Sclera non-icteric Neck: Supple.  Trachea midline Pulmonary:  Good air movement, no use of accessory muscles.  Cardiac: RRR, no JVD Vascular:  Vessel Right Left  Radial Palpable Palpable                          PT Not Palpable Not Palpable  DP Not Palpable 1+ Palpable   Gastrointestinal: soft, non-tender/non-distended. No guarding/reflex.  Musculoskeletal: M/S 5/5 throughout.  No deformity or atrophy. 2+ RLE edema, 1+ LLE edema. Neurologic: Sensation grossly intact in extremities.  Symmetrical.  Speech is fluent.  Psychiatric: Judgment and insight are not great. Not a good historian Dermatologic: No rashes or ulcers noted.  No cellulitis or open wounds.      Labs No results found for this or any previous visit (from the past 2160 hour(s)).  Radiology No results found.  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 Also, involved in wound healing. On appropriate medications.     CKD (chronic kidney disease) stage 3, GFR 30-59 ml/min (HCC) Hydrate and limit contrast with the procedure.   Combined hyperlipidemia lipid control important in reducing the progression of atherosclerotic disease. Continue statin therapy  Atherosclerosis of native arteries of extremity with rest pain ([place]) Not surprisingly, his ABIs today are markedly reduced at less than 0.2 on the right and 0.5 on the left.  This is a critical and limb threatening situation.   The patient understands that this is in large part due to his medical noncompliance, inactivity, and his heavy tobacco use.  We are going to try to revascularize him next week.  His daughter says she has to check her scheduled to see what day she can bring him.  Smoking cessation and increasing activity was strongly recommended.  They are agreeable to proceed with angiography.  Tobacco use disorder We had a discussion for approximately 3 minutes regarding the absolute need for smoking cessation due to the deleterious nature of tobacco on the vascular system. We discussed the tobacco use would diminish patency of any intervention, and likely significantly worsen progressio of  disease. We discussed multiple agents for quitting including replacement therapy or medications to reduce cravings such as Chantix. The patient voices their understanding of the importance of smoking cessation.    [name], MD  05/10/2021 3:20 PM    This note was created with Dragon medical transcription system.  Any errors from dictation are purely unintentional

## 2021-05-10 NOTE — H&P (View-Only) (Signed)
MRN : [id number]  Keith Levy is a 80 y.o. (1941/04/10) male who presents with chief complaint of  Chief Complaint  Patient presents with   Follow-up    Ultrasound follow up  .  History of Present Illness: Patient returns today in follow up of his PAD.  Since his last visit 4 months ago and his ABIs were normalized after bilateral lower extremity interventions, his daughter says the only time he gets off the couch is to go to the refrigerator and get a beer.  He smoking several packs of cigarettes a day.  Not surprisingly, his ABIs today are markedly reduced at less than 0.2 on the right and 0.5 on the left.  He is having severe rest pain on the right but no ulcerations.  On the left, he has some numbness and tingling but not true rest pain.  Current Outpatient Medications  Medication Sig Dispense Refill   aspirin 81 MG EC tablet Take 81 mg by mouth daily.     clopidogrel (PLAVIX) 75 MG tablet Take 1 tablet (75 mg total) by mouth daily. 30 tablet 11   furosemide (LASIX) 20 MG tablet Take 20 mg by mouth daily.     lithium carbonate 300 MG capsule Take 300 mg by mouth 2 (two) times daily.     PARoxetine (PAXIL) 20 MG tablet Take 20 mg by mouth daily.     QUEtiapine (SEROQUEL) 200 MG tablet Take 200 mg by mouth at bedtime.     QUEtiapine (SEROQUEL) 50 MG tablet Take 50 mg by mouth at bedtime.     simvastatin (ZOCOR) 20 MG tablet Take 20 mg by mouth daily.     No current facility-administered medications for this visit.    Past Medical History:  Diagnosis Date   Bipolar disorder ([place])    Hyperlipidemia    PAD (peripheral artery disease) ([place])     Past Surgical History:  Procedure Laterality Date   LOWER EXTREMITY ANGIOGRAPHY Right 10/22/2020   Procedure: LOWER EXTREMITY ANGIOGRAPHY;  Surgeon: Algernon Huxley, MD;  Location: [facility];  Service: Cardiovascular;  Laterality: Right;   LOWER EXTREMITY ANGIOGRAPHY Left 11/01/2020   Procedure: LOWER EXTREMITY ANGIOGRAPHY;   Surgeon: Algernon Huxley, MD;  Location: [facility];  Service: Cardiovascular;  Laterality: Left;   LOWER EXTREMITY ANGIOGRAPHY Right 12/06/2020   Procedure: LOWER EXTREMITY ANGIOGRAPHY;  Surgeon: Algernon Huxley, MD;  Location: [facility];  Service: Cardiovascular;  Laterality: Right;   VASCULAR SURGERY     stents on LLE and RLE     Social History   Tobacco Use   Smoking status: Every Day    Packs/day: 1.00    Types: Cigarettes   Smokeless tobacco: Never  Substance Use Topics   Alcohol use: Yes    Comment: quite often max 3 per night      Family History No bleeding disorders, clotting disorders, autoimmune diseases or aneurysms  No Known Allergies   REVIEW OF SYSTEMS (Negative unless checked)  Constitutional: '[]'$ Weight loss  '[]'$ Fever  '[]'$ Chills Cardiac: '[]'$ Chest pain   '[]'$ Chest pressure   '[]'$ Palpitations   '[]'$ Shortness of breath when laying flat   '[]'$ Shortness of breath at rest   '[]'$ Shortness of breath with exertion. Vascular:  '[x]'$ Pain in legs with walking   '[]'$ Pain in legs at rest   '[]'$ Pain in legs when laying flat   '[]'$ Claudication   '[]'$ Pain in feet when walking  '[x]'$ Pain in feet at rest  '[x]'$ Pain in feet  when laying flat   '[]'$ History of DVT   '[]'$ Phlebitis   '[x]'$ Swelling in legs   '[]'$ Varicose veins   '[]'$ Non-healing ulcers Pulmonary:   '[]'$ Uses home oxygen   '[]'$ Productive cough   '[]'$ Hemoptysis   '[]'$ Wheeze  '[]'$ COPD   '[]'$ Asthma Neurologic:  '[]'$ Dizziness  '[]'$ Blackouts   '[]'$ Seizures   '[]'$ History of stroke   '[]'$ History of TIA  '[]'$ Aphasia   '[]'$ Temporary blindness   '[]'$ Dysphagia   '[]'$ Weakness or numbness in arms   '[]'$ Weakness or numbness in legs Musculoskeletal:  '[x]'$ Arthritis   '[]'$ Joint swelling   '[x]'$ Joint pain   '[]'$ Low back pain Hematologic:  '[]'$ Easy bruising  '[]'$ Easy bleeding   '[]'$ Hypercoagulable state   '[]'$ Anemic   Gastrointestinal:  '[]'$ Blood in stool   '[]'$ Vomiting blood  '[]'$ Gastroesophageal reflux/heartburn   '[]'$ Abdominal pain Genitourinary:  '[]'$ Chronic kidney disease   '[]'$ Difficult urination  '[]'$ Frequent urination   '[]'$ Burning with urination   '[]'$ Hematuria Skin:  '[]'$ Rashes   '[]'$ Ulcers   '[]'$ Wounds Psychological:  '[]'$ History of anxiety   '[]'$  History of major depression.  Physical Examination  BP 132/65 (BP Location: Right Arm)   Pulse 70   Resp 16   Wt 172 lb (78 kg)   BMI 23.99 kg/m  Gen:  WD/WN, NAD Head: [place]/AT, No temporalis wasting. Ear/Nose/Throat: Hearing grossly intact, nares w/o erythema or drainage Eyes: Conjunctiva clear. Sclera non-icteric Neck: Supple.  Trachea midline Pulmonary:  Good air movement, no use of accessory muscles.  Cardiac: RRR, no JVD Vascular:  Vessel Right Left  Radial Palpable Palpable                          PT Not Palpable Not Palpable  DP Not Palpable 1+ Palpable   Gastrointestinal: soft, non-tender/non-distended. No guarding/reflex.  Musculoskeletal: M/S 5/5 throughout.  No deformity or atrophy. 2+ RLE edema, 1+ LLE edema. Neurologic: Sensation grossly intact in extremities.  Symmetrical.  Speech is fluent.  Psychiatric: Judgment and insight are not great. Not a good historian Dermatologic: No rashes or ulcers noted.  No cellulitis or open wounds.      Labs No results found for this or any previous visit (from the past 2160 hour(s)).  Radiology No results found.  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 Also, involved in wound healing. On appropriate medications.     CKD (chronic kidney disease) stage 3, GFR 30-59 ml/min (HCC) Hydrate and limit contrast with the procedure.   Combined hyperlipidemia lipid control important in reducing the progression of atherosclerotic disease. Continue statin therapy  Atherosclerosis of native arteries of extremity with rest pain ([place]) Not surprisingly, his ABIs today are markedly reduced at less than 0.2 on the right and 0.5 on the left.  This is a critical and limb threatening situation.   The patient understands that this is in large part due to his medical noncompliance, inactivity, and his heavy tobacco use.  We are going to try to revascularize him next week.  His daughter says she has to check her scheduled to see what day she can bring him.  Smoking cessation and increasing activity was strongly recommended.  They are agreeable to proceed with angiography.  Tobacco use disorder We had a discussion for approximately 3 minutes regarding the absolute need for smoking cessation due to the deleterious nature of tobacco on the vascular system. We discussed the tobacco use would diminish patency of any intervention, and likely significantly worsen progressio of  disease. We discussed multiple agents for quitting including replacement therapy or medications to reduce cravings such as Chantix. The patient voices their understanding of the importance of smoking cessation.    [name], MD  05/10/2021 3:20 PM    This note was created with Dragon medical transcription system.  Any errors from dictation are purely unintentional

## 2021-05-10 NOTE — Assessment & Plan Note (Signed)

## 2021-05-10 NOTE — H&P (View-Only) (Signed)
MRN : [id number]  Keith Levy is a 80 y.o. (06/20/1941) male who presents with chief complaint of  Chief Complaint  Patient presents with   Follow-up    Ultrasound follow up  .  History of Present Illness: Patient returns today in follow up of his PAD.  Since his last visit 4 months ago and his ABIs were normalized after bilateral lower extremity interventions, his daughter says the only time he gets off the couch is to go to the refrigerator and get a beer.  He smoking several packs of cigarettes a day.  Not surprisingly, his ABIs today are markedly reduced at less than 0.2 on the right and 0.5 on the left.  He is having severe rest pain on the right but no ulcerations.  On the left, he has some numbness and tingling but not true rest pain.  Current Outpatient Medications  Medication Sig Dispense Refill   aspirin 81 MG EC tablet Take 81 mg by mouth daily.     clopidogrel (PLAVIX) 75 MG tablet Take 1 tablet (75 mg total) by mouth daily. 30 tablet 11   furosemide (LASIX) 20 MG tablet Take 20 mg by mouth daily.     lithium carbonate 300 MG capsule Take 300 mg by mouth 2 (two) times daily.     PARoxetine (PAXIL) 20 MG tablet Take 20 mg by mouth daily.     QUEtiapine (SEROQUEL) 200 MG tablet Take 200 mg by mouth at bedtime.     QUEtiapine (SEROQUEL) 50 MG tablet Take 50 mg by mouth at bedtime.     simvastatin (ZOCOR) 20 MG tablet Take 20 mg by mouth daily.     No current facility-administered medications for this visit.    Past Medical History:  Diagnosis Date   Bipolar disorder ([place])    Hyperlipidemia    PAD (peripheral artery disease) ([place])     Past Surgical History:  Procedure Laterality Date   LOWER EXTREMITY ANGIOGRAPHY Right 10/22/2020   Procedure: LOWER EXTREMITY ANGIOGRAPHY;  Surgeon: Algernon Huxley, MD;  Location: [facility];  Service: Cardiovascular;  Laterality: Right;   LOWER EXTREMITY ANGIOGRAPHY Left 11/01/2020   Procedure: LOWER EXTREMITY ANGIOGRAPHY;   Surgeon: Algernon Huxley, MD;  Location: [facility];  Service: Cardiovascular;  Laterality: Left;   LOWER EXTREMITY ANGIOGRAPHY Right 12/06/2020   Procedure: LOWER EXTREMITY ANGIOGRAPHY;  Surgeon: Algernon Huxley, MD;  Location: [facility];  Service: Cardiovascular;  Laterality: Right;   VASCULAR SURGERY     stents on LLE and RLE     Social History   Tobacco Use   Smoking status: Every Day    Packs/day: 1.00    Types: Cigarettes   Smokeless tobacco: Never  Substance Use Topics   Alcohol use: Yes    Comment: quite often max 3 per night      Family History No bleeding disorders, clotting disorders, autoimmune diseases or aneurysms  No Known Allergies   REVIEW OF SYSTEMS (Negative unless checked)  Constitutional: '[]'$ Weight loss  '[]'$ Fever  '[]'$ Chills Cardiac: '[]'$ Chest pain   '[]'$ Chest pressure   '[]'$ Palpitations   '[]'$ Shortness of breath when laying flat   '[]'$ Shortness of breath at rest   '[]'$ Shortness of breath with exertion. Vascular:  '[x]'$ Pain in legs with walking   '[]'$ Pain in legs at rest   '[]'$ Pain in legs when laying flat   '[]'$ Claudication   '[]'$ Pain in feet when walking  '[x]'$ Pain in feet at rest  '[x]'$ Pain in feet  when laying flat   '[]'$ History of DVT   '[]'$ Phlebitis   '[x]'$ Swelling in legs   '[]'$ Varicose veins   '[]'$ Non-healing ulcers Pulmonary:   '[]'$ Uses home oxygen   '[]'$ Productive cough   '[]'$ Hemoptysis   '[]'$ Wheeze  '[]'$ COPD   '[]'$ Asthma Neurologic:  '[]'$ Dizziness  '[]'$ Blackouts   '[]'$ Seizures   '[]'$ History of stroke   '[]'$ History of TIA  '[]'$ Aphasia   '[]'$ Temporary blindness   '[]'$ Dysphagia   '[]'$ Weakness or numbness in arms   '[]'$ Weakness or numbness in legs Musculoskeletal:  '[x]'$ Arthritis   '[]'$ Joint swelling   '[x]'$ Joint pain   '[]'$ Low back pain Hematologic:  '[]'$ Easy bruising  '[]'$ Easy bleeding   '[]'$ Hypercoagulable state   '[]'$ Anemic   Gastrointestinal:  '[]'$ Blood in stool   '[]'$ Vomiting blood  '[]'$ Gastroesophageal reflux/heartburn   '[]'$ Abdominal pain Genitourinary:  '[]'$ Chronic kidney disease   '[]'$ Difficult urination  '[]'$ Frequent urination   '[]'$ Burning with urination   '[]'$ Hematuria Skin:  '[]'$ Rashes   '[]'$ Ulcers   '[]'$ Wounds Psychological:  '[]'$ History of anxiety   '[]'$  History of major depression.  Physical Examination  BP 132/65 (BP Location: Right Arm)   Pulse 70   Resp 16   Wt 172 lb (78 kg)   BMI 23.99 kg/m  Gen:  WD/WN, NAD Head: [place]/AT, No temporalis wasting. Ear/Nose/Throat: Hearing grossly intact, nares w/o erythema or drainage Eyes: Conjunctiva clear. Sclera non-icteric Neck: Supple.  Trachea midline Pulmonary:  Good air movement, no use of accessory muscles.  Cardiac: RRR, no JVD Vascular:  Vessel Right Left  Radial Palpable Palpable                          PT Not Palpable Not Palpable  DP Not Palpable 1+ Palpable   Gastrointestinal: soft, non-tender/non-distended. No guarding/reflex.  Musculoskeletal: M/S 5/5 throughout.  No deformity or atrophy. 2+ RLE edema, 1+ LLE edema. Neurologic: Sensation grossly intact in extremities.  Symmetrical.  Speech is fluent.  Psychiatric: Judgment and insight are not great. Not a good historian Dermatologic: No rashes or ulcers noted.  No cellulitis or open wounds.      Labs No results found for this or any previous visit (from the past 2160 hour(s)).  Radiology No results found.  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 Also, involved in wound healing. On appropriate medications.     CKD (chronic kidney disease) stage 3, GFR 30-59 ml/min (HCC) Hydrate and limit contrast with the procedure.   Combined hyperlipidemia lipid control important in reducing the progression of atherosclerotic disease. Continue statin therapy  Atherosclerosis of native arteries of extremity with rest pain ([place]) Not surprisingly, his ABIs today are markedly reduced at less than 0.2 on the right and 0.5 on the left.  This is a critical and limb threatening situation.   The patient understands that this is in large part due to his medical noncompliance, inactivity, and his heavy tobacco use.  We are going to try to revascularize him next week.  His daughter says she has to check her scheduled to see what day she can bring him.  Smoking cessation and increasing activity was strongly recommended.  They are agreeable to proceed with angiography.  Tobacco use disorder We had a discussion for approximately 3 minutes regarding the absolute need for smoking cessation due to the deleterious nature of tobacco on the vascular system. We discussed the tobacco use would diminish patency of any intervention, and likely significantly worsen progressio of  disease. We discussed multiple agents for quitting including replacement therapy or medications to reduce cravings such as Chantix. The patient voices their understanding of the importance of smoking cessation.    [name], MD  05/10/2021 3:20 PM    This note was created with Dragon medical transcription system.  Any errors from dictation are purely unintentional

## 2021-05-14 ENCOUNTER — Telehealth (INDEPENDENT_AMBULATORY_CARE_PROVIDER_SITE_OTHER): Payer: Self-pay | Admitting: Vascular Surgery

## 2021-05-14 NOTE — Telephone Encounter (Signed)
Called stating they are ready to move forward with procedure. Daughter wants to know if procedure can either be Sept 8 or 15 as her sibling is coming in to town to help with patient post procedure. Patient states they were told that someone would call them Friday when they were in clinic and hadn't heard anything. Please advise.

## 2021-05-14 NOTE — Telephone Encounter (Signed)
Patient is schedule for right lower extremity with Dr Lucky Cowboy on 05/16/21 arrival time 10:45 at the [facility]. Pre-procedure instructions were gone over with patient daughter

## 2021-05-16 ENCOUNTER — Encounter
Admission: RE | Disposition: A | Discharge status: Home / Self Care | Payer: Self-pay | Source: Home / Self Care | Attending: Vascular Surgery

## 2021-05-16 ENCOUNTER — Ambulatory Visit
Admission: RE | Admit: 2021-05-16 | Discharge: 2021-05-16 | Disposition: A | Discharge status: Home / Self Care | Payer: Medicare Other | Attending: Vascular Surgery | Admitting: Vascular Surgery

## 2021-05-16 ENCOUNTER — Encounter: Payer: Self-pay | Admitting: Vascular Surgery

## 2021-05-16 ENCOUNTER — Other Ambulatory Visit (INDEPENDENT_AMBULATORY_CARE_PROVIDER_SITE_OTHER): Payer: Self-pay | Admitting: Nurse Practitioner

## 2021-05-16 ENCOUNTER — Other Ambulatory Visit: Payer: Self-pay

## 2021-05-16 DIAGNOSIS — N183 Chronic kidney disease, stage 3 unspecified: Secondary | ICD-10-CM | POA: Insufficient documentation

## 2021-05-16 DIAGNOSIS — Z79899 Other long term (current) drug therapy: Secondary | ICD-10-CM | POA: Diagnosis not present

## 2021-05-16 DIAGNOSIS — E782 Mixed hyperlipidemia: Secondary | ICD-10-CM | POA: Diagnosis not present

## 2021-05-16 DIAGNOSIS — Z7902 Long term (current) use of antithrombotics/antiplatelets: Secondary | ICD-10-CM | POA: Diagnosis not present

## 2021-05-16 DIAGNOSIS — F17210 Nicotine dependence, cigarettes, uncomplicated: Secondary | ICD-10-CM | POA: Insufficient documentation

## 2021-05-16 DIAGNOSIS — E1151 Type 2 diabetes mellitus with diabetic peripheral angiopathy without gangrene: Secondary | ICD-10-CM | POA: Diagnosis not present

## 2021-05-16 DIAGNOSIS — I70221 Atherosclerosis of native arteries of extremities with rest pain, right leg: Secondary | ICD-10-CM | POA: Insufficient documentation

## 2021-05-16 DIAGNOSIS — E1122 Type 2 diabetes mellitus with diabetic chronic kidney disease: Secondary | ICD-10-CM | POA: Diagnosis not present

## 2021-05-16 DIAGNOSIS — Z7982 Long term (current) use of aspirin: Secondary | ICD-10-CM | POA: Diagnosis not present

## 2021-05-16 DIAGNOSIS — I70229 Atherosclerosis of native arteries of extremities with rest pain, unspecified extremity: Secondary | ICD-10-CM

## 2021-05-16 HISTORY — PX: LOWER EXTREMITY ANGIOGRAPHY: CATH118251

## 2021-05-16 LAB — BUN: BUN: 22 mg/dL (ref 8–23)

## 2021-05-16 LAB — CREATININE, SERUM
Creatinine, Ser: 1.74 mg/dL — ABNORMAL HIGH (ref 0.61–1.24)
GFR, Estimated: 40 mL/min — ABNORMAL LOW (ref >60)

## 2021-05-16 SURGERY — LOWER EXTREMITY ANGIOGRAPHY
Anesthesia: Moderate Sedation | Laterality: Right

## 2021-05-16 MED ORDER — SODIUM CHLORIDE 0.9 % IV SOLN: INTRAVENOUS | Status: DC

## 2021-05-16 MED ORDER — IODIXANOL 320 MG/ML IV SOLN
INTRAVENOUS | Status: DC | PRN
  Administered 2021-05-16: 65 mL via INTRA_ARTERIAL

## 2021-05-16 MED ORDER — DIPHENHYDRAMINE HCL 50 MG/ML IJ SOLN: 50.0000 mg | Freq: Once | INTRAMUSCULAR | Status: DC | PRN

## 2021-05-16 MED ORDER — CEFAZOLIN SODIUM-DEXTROSE 2-4 GM/100ML-% IV SOLN
INTRAVENOUS | Status: AC
  Administered 2021-05-16: 2 g
  Filled 2021-05-16: qty 100

## 2021-05-16 MED ORDER — HEPARIN SODIUM (PORCINE) 1000 UNIT/ML IJ SOLN
INTRAMUSCULAR | Status: AC
  Filled 2021-05-16: qty 1

## 2021-05-16 MED ORDER — MIDAZOLAM HCL 2 MG/2ML IJ SOLN
INTRAMUSCULAR | Status: DC | PRN
  Administered 2021-05-16 (×3): 1 mg via INTRAVENOUS

## 2021-05-16 MED ORDER — FENTANYL CITRATE (PF) 100 MCG/2ML IJ SOLN
INTRAMUSCULAR | Status: AC
  Filled 2021-05-16: qty 2

## 2021-05-16 MED ORDER — MIDAZOLAM HCL 2 MG/ML PO SYRP: 8.0000 mg | ORAL_SOLUTION | Freq: Once | ORAL | Status: DC | PRN

## 2021-05-16 MED ORDER — FENTANYL CITRATE (PF) 100 MCG/2ML IJ SOLN
INTRAMUSCULAR | Status: DC | PRN
  Administered 2021-05-16: 25 ug via INTRAVENOUS
  Administered 2021-05-16: 50 ug via INTRAVENOUS
  Administered 2021-05-16: 25 ug via INTRAVENOUS

## 2021-05-16 MED ORDER — FAMOTIDINE 20 MG PO TABS: 40.0000 mg | ORAL_TABLET | Freq: Once | ORAL | Status: DC | PRN

## 2021-05-16 MED ORDER — HEPARIN SODIUM (PORCINE) 1000 UNIT/ML IJ SOLN
INTRAMUSCULAR | Status: DC | PRN
  Administered 2021-05-16: 5000 [IU] via INTRAVENOUS

## 2021-05-16 MED ORDER — HYDROMORPHONE HCL 1 MG/ML IJ SOLN
1.0000 mg | Freq: Once | INTRAMUSCULAR | Status: AC | PRN
  Administered 2021-05-16: 0.5 mg via INTRAVENOUS

## 2021-05-16 MED ORDER — MIDAZOLAM HCL 2 MG/2ML IJ SOLN
INTRAMUSCULAR | Status: AC
  Filled 2021-05-16: qty 4

## 2021-05-16 MED ORDER — HYDROMORPHONE HCL 1 MG/ML IJ SOLN
INTRAMUSCULAR | Status: AC
  Filled 2021-05-16: qty 0.5

## 2021-05-16 MED ORDER — CEFAZOLIN SODIUM-DEXTROSE 2-4 GM/100ML-% IV SOLN: 2.0000 g | Freq: Once | INTRAVENOUS | Status: DC

## 2021-05-16 MED ORDER — ONDANSETRON HCL 4 MG/2ML IJ SOLN: 4.0000 mg | Freq: Four times a day (QID) | INTRAMUSCULAR | Status: DC | PRN

## 2021-05-16 MED ORDER — METHYLPREDNISOLONE SODIUM SUCC 125 MG IJ SOLR: 125.0000 mg | Freq: Once | INTRAMUSCULAR | Status: DC | PRN

## 2021-05-16 SURGICAL SUPPLY — 22 items
BALLN LUTONIX 5X150X130 (BALLOONS) ×2
BALLN LUTONIX 5X220X130 (BALLOONS) ×2
BALLN LUTONIX 6X150X130 (BALLOONS) ×2
BALLOON LUTONIX 5X150X130 (BALLOONS) IMPLANT
BALLOON LUTONIX 5X220X130 (BALLOONS) IMPLANT
BALLOON LUTONIX 6X150X130 (BALLOONS) IMPLANT
CATH ANGIO 5F PIGTAIL 65CM (CATHETERS) ×2 IMPLANT
CATH BEACON 5 .035 65 KMP TIP (CATHETERS) ×1 IMPLANT
CATH BEACON 5 .038 100 VERT TP (CATHETERS) ×2 IMPLANT
CATH TEMPO 5F RIM 65CM (CATHETERS) ×1 IMPLANT
DEVICE STARCLOSE SE CLOSURE (Vascular Products) ×1 IMPLANT
GLIDEWIRE ADV .035X260CM (WIRE) ×1 IMPLANT
GUIDEWIRE SUPER STIFF .035X180 (WIRE) ×2 IMPLANT
KIT ENCORE 26 ADVANTAGE (KITS) ×1 IMPLANT
KIT MICROPUNCTURE NIT STIFF (SHEATH) ×2 IMPLANT
PACK ANGIOGRAPHY (CUSTOM PROCEDURE TRAY) ×2 IMPLANT
SHEATH ANL2 6FRX45 HC (SHEATH) ×1 IMPLANT
SHEATH BRITE TIP 4FRX11 (SHEATH) ×1 IMPLANT
SHEATH BRITE TIP 5FRX11 (SHEATH) ×1 IMPLANT
SYR MEDRAD MARK 7 150ML (SYRINGE) ×1 IMPLANT
TUBING CONTRAST HIGH PRESS 72 (TUBING) ×2 IMPLANT
WIRE GUIDERIGHT .035X150 (WIRE) ×1 IMPLANT

## 2021-05-16 NOTE — Progress Notes (Signed)
Pt. C/o right toe pain "8" on scale 1-10. Pt. Med. With Dilaudid 0.5 mg IV slow push for c/o pain.

## 2021-05-16 NOTE — Interval H&P Note (Signed)
History and Physical Interval Note:  05/16/2021 11:15 AM  Keith Levy  has presented today for surgery, with the diagnosis of RLE Angio   BARD   ASO w rest pain.  The various methods of treatment have been discussed with the patient and family. After consideration of risks, benefits and other options for treatment, the patient has consented to  Procedure(s): LOWER EXTREMITY ANGIOGRAPHY (Right) as a surgical intervention.  The patient's history has been reviewed, patient examined, no change in status, stable for surgery.  I have reviewed the patient's chart and labs.  Questions were answered to the patient's satisfaction.     [name]

## 2021-05-16 NOTE — Progress Notes (Signed)
In& out cath done secondary to pt. Unable to void. Urine amber, clear. Encouraged pt. To drink more h2O when going home today. Pt. Verbalized understanding.

## 2021-05-16 NOTE — Op Note (Signed)
[facility]  Percutaneous Study/Intervention Procedural Note   Date of Surgery: 05/16/2021  Surgeon(s):Lindsea Olivar    Assistants:none  Pre-operative Diagnosis: PAD with rest Levy RLE  Post-operative diagnosis:  Same  Procedure(s) Performed:             1.  Ultrasound guidance for vascular access left femoral artery             2.  Catheter placement into right common femoral artery from left femoral approach             3.  Aortogram and selective right lower extremity angiogram             4.  Percutaneous transluminal angioplasty of right common and external iliac arteries with 6 mm diameter by 15 cm length Lutonix drug-coated angioplasty balloon             5.  Percutaneous transluminal angioplasty of right SFA and above-knee popliteal artery with 5 mm diameter Lutonix drug-coated angioplasty balloons  6.  StarClose closure device left femoral artery  EBL: 10 cc  Contrast: 65 cc  Fluoro Time: 8.1 minutes  Moderate Conscious Sedation Time: approximately 57 minutes using 3 mg of Versed and 100 mcg of Fentanyl              Indications:  Patient is a 80 y.o.male with severe peripheral arterial disease and clear rest Levy of the right foot. The patient has noninvasive study showing markedly reduced perfusion on the right as well as some reduced perfusion on the left. The patient is brought in for angiography for further evaluation and potential treatment.  Due to the limb threatening nature of the situation, angiogram was performed for attempted limb salvage. The patient is aware that if the procedure fails, amputation would be expected.  The patient also understands that even with successful revascularization, amputation may still be required due to the severity of the situation.  Risks and benefits are discussed and informed consent is obtained.   Procedure:  The patient was identified and appropriate procedural time out was performed.  The patient was then placed  supine on the table and prepped and draped in the usual sterile fashion. Moderate conscious sedation was administered during a face to face encounter with the patient throughout the procedure with my supervision of the RN administering medicines and monitoring the patient's vital signs, pulse oximetry, telemetry and mental status throughout from the start of the procedure until the patient was taken to the recovery room. Ultrasound was used to evaluate the left common femoral artery.  It was patent .  A digital ultrasound image was acquired.  A Seldinger needle was used to access the left common femoral artery under direct ultrasound guidance and a permanent image was performed.  A 0.035 J wire was advanced without resistance and a 5Fr sheath was placed.  Pigtail catheter was placed into the aorta and an AP aortogram was performed. This demonstrated no obvious stenosis of the renal arteries.  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  The left common iliac artery had moderate stenosis in the 50 to 60% range at the bottom of a previously placed proximal common iliac artery stent.  The external iliac artery stents were patent.  The right common iliac artery was occluded at its origin.  The external iliac artery was occluded and there was reconstitution of the common femoral artery which appeared significantly diseased. I then crossed the aortic bifurcation and advanced to the  right femoral head.  This was done with minimal difficulty with a rim catheter and an advantage wire through the iliac occlusion.  Selective right lower extremity angiogram was then performed. This demonstrated calcific disease of the common femoral artery with what appeared to be a significant stenosis in the 70% range of the profunda femoris artery.  The right SFA was occluded at its origin and the multiple previously placed stents.  There was reconstitution faintly of a mid popliteal artery.  Although flow was quite sluggish, there  appeared to be two-vessel runoff distally through the anterior tibial and posterior tibial arteries. It was felt that it was in the patient's best interest to proceed with intervention after these images to avoid a second procedure and a larger amount of contrast and fluoroscopy based off of the findings from the initial angiogram. The patient was systemically heparinized and prior to placing the up and over sheath, a 6 mm diameter by 15 cm length Lutonix drug-coated angioplasty balloon was inflated to 10 atm for 1 minute in the right common and external iliac arteries.  Following this, there remained significant residual disease within the iliac arteries but I proceeded with the up and over sheath.  A 6 French Ansell sheath was then placed over the [employer] wire. I then used a Kumpe catheter and the advantage wire to navigate through the SFA occlusion with minimal difficulty and confirm intraluminal flow in the popliteal artery below the knee.  I then replaced the advantage wire.  Angioplasty was performed with 2 inflations with a 5 mm diameter by 22 cm length Lutonix drug-coated angioplasty balloon from the popliteal artery just below the knee up through the proximal superficial femoral artery and a second 5 mm diameter balloon this time 10 cm length was used to treat the proximal SFA and inflated to 12 atm for 1 minute.  Completion imaging showed no improvement.  At this extensive disease, significant femoral disease including profunda femoris disease, and need for a much larger sheath to treat the iliac lesions not wanting to access the right iliac today, I felt the best option would be a hybrid or procedure which would include a right femoral endarterectomy, bilateral iliac intervention, and right SFA intervention.  This will be done in the near future once patient gets scheduled for the procedure and preoperative clearance. I elected to terminate the procedure. The sheath was removed and StarClose  closure device was deployed in the left femoral artery with excellent hemostatic result. The patient was taken to the recovery room in stable condition having tolerated the procedure well.  Findings:               Aortogram:  This demonstrated no obvious stenosis of the renal arteries.  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  The left common iliac artery had moderate stenosis in the 50 to 60% range at the bottom of a previously placed proximal common iliac artery stent.  The external iliac artery stents were patent.  The right common iliac artery was occluded at its origin.  The external iliac artery was occluded and there was reconstitution of the common femoral artery which appeared significantly diseased.             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  The right SFA was occluded at its origin and the multiple previously  placed stents.  There was reconstitution faintly of a mid popliteal artery.  Although flow was quite sluggish, there appeared to be two-vessel runoff distally through the anterior tibial and posterior tibial arteries   Disposition: Patient was taken to the recovery room in stable condition having tolerated the procedure well.  Complications: None  Keith Levy 05/16/2021 2:42 PM   This note was created with Dragon Medical transcription system. Any errors in dictation are purely unintentional.

## 2021-05-16 NOTE — Progress Notes (Signed)
New orders received. IVF's increased to 125 ml/hr. Per orders.

## 2021-05-16 NOTE — Progress Notes (Signed)
Pt. BUN/CRE resulted. MD made aware. Pt. Daughter [name] states pt. Has stage 3 kidney disease an also has emphysema. Pt in no acute distress at present.

## 2021-05-17 ENCOUNTER — Telehealth (INDEPENDENT_AMBULATORY_CARE_PROVIDER_SITE_OTHER): Payer: Self-pay

## 2021-05-17 ENCOUNTER — Encounter: Payer: Self-pay | Admitting: Vascular Surgery

## 2021-05-17 NOTE — Telephone Encounter (Signed)
Patient is schedule right femoral endarterectomy with right sfa stents and bilateral iliac stents for 05/22/21 with Dr Lucky Cowboy. The patient is schedule for pre-op 05/21/21 8-1 pm on the phone and Covid-19 testing 9/6 at the [facility] between 8-12 pm.Patient daughter was made aware with pre-surgical instructions.

## 2021-05-21 ENCOUNTER — Encounter
Admission: RE | Admit: 2021-05-21 | Discharge: 2021-05-21 | Disposition: A | Discharge status: Home / Self Care | Payer: Medicare Other | Source: Ambulatory Visit | Attending: Vascular Surgery | Admitting: Vascular Surgery

## 2021-05-21 ENCOUNTER — Other Ambulatory Visit
Admission: RE | Admit: 2021-05-21 | Discharge: 2021-05-21 | Disposition: A | Discharge status: Home / Self Care | Payer: Medicare Other | Source: Ambulatory Visit | Attending: Vascular Surgery | Admitting: Vascular Surgery

## 2021-05-21 ENCOUNTER — Other Ambulatory Visit (INDEPENDENT_AMBULATORY_CARE_PROVIDER_SITE_OTHER): Payer: Self-pay | Admitting: Nurse Practitioner

## 2021-05-21 ENCOUNTER — Other Ambulatory Visit: Payer: Self-pay

## 2021-05-21 DIAGNOSIS — Z20822 Contact with and (suspected) exposure to covid-19: Secondary | ICD-10-CM | POA: Insufficient documentation

## 2021-05-21 HISTORY — DX: Prediabetes: R73.03

## 2021-05-21 HISTORY — DX: Chronic kidney disease, stage 3 unspecified: N18.30

## 2021-05-21 LAB — SARS CORONAVIRUS 2 (TAT 6-24 HRS): SARS Coronavirus 2: NEGATIVE

## 2021-05-21 NOTE — Patient Instructions (Addendum)
Your procedure is scheduled on: 05/22/21 Report to [facility]. To find out your arrival time please call [phone] between 1PM - 3PM on 05/21/21.  Remember: Instructions that are not followed completely may result in serious medical risk, up to and including death, or upon the discretion of your surgeon and anesthesiologist your surgery may need to be rescheduled.     _X__ 1. Do not eat food after midnight the night before your procedure.                 No gum chewing or hard candies. You may drink clear liquids up to 2 hours                 before you are scheduled to arrive for your surgery- DO not drink clear                 liquids within 2 hours of the start of your surgery.                 Diabetics water only  __X__2.  On the morning of surgery brush your teeth with toothpaste and water, you                 may rinse your mouth with mouthwash if you wish.  Do not swallow any              toothpaste of mouthwash.     _X__ 3.  No Alcohol for 24 hours before or after surgery.   _X__ 4.  Do Not Smoke or use e-cigarettes For 24 Hours Prior to Your Surgery.                 Do not use any chewable tobacco products for at least 6 hours prior to                 surgery.  ____  5.  Bring all medications with you on the day of surgery if instructed.   __X__  6.  Notify your doctor if there is any change in your medical condition      (cold, fever, infections).     Do not wear jewelry, make-up, hairpins, clips or nail polish. Do not wear lotions, powders, or perfumes.  Do not shave body hair 48 hours prior to surgery. Men may shave face and neck. Do not bring valuables to the hospital.    [facility] is not responsible for any belongings or valuables.  Contacts, dentures/partials or body piercings may not be worn into surgery. Bring a case for your contacts, glasses or hearing aids, a denture cup will be supplied. Leave your  suitcase in the car. After surgery it may be brought to your room. For patients admitted to the hospital, discharge time is determined by your treatment team.   Patients discharged the day of surgery will not be allowed to drive home.   Please read over the following fact sheets that you were given:    __X__ Take these medicines the morning of surgery with A SIP OF WATER:    1. lithium carbonate 300 MG capsule  2. PARoxetine (PAXIL) 20 MG tablet  3. simvastatin (ZOCOR) 20 MG tablet  4.  5.  6.  ____ Fleet Enema (as directed)   __X__ Use CHG Soap/SAGE wipes as directed  ____ Use inhalers on the day of surgery  ____ Stop metformin/Janumet/Farxiga 2 days prior to surgery  ____ Take 1/2 of usual insulin dose the night before surgery. No insulin the morning          of surgery.   ____ Stop Blood Thinners Coumadin/Plavix/Xarelto/Pleta/Pradaxa/Eliquis/Effient/Aspirin  on   Or contact your Surgeon, Cardiologist or Medical Doctor regarding  ability to stop your blood thinners  __X__ Stop Anti-inflammatories 7 days before surgery such as Advil, Ibuprofen, Motrin,  BC or Goodies Powder, Naprosyn, Naproxen, Aleve, Aspirin    __X__ Stop all herbal supplements, fish oil or vitamin E until after surgery.    ____ Bring C-Pap to the hospital.

## 2021-05-22 ENCOUNTER — Inpatient Hospital Stay
Admission: RE | Admit: 2021-05-22 | Discharge: 2021-05-22 | Disposition: A | Discharge status: Home / Self Care | Payer: Medicare Other | Source: Home / Self Care | Attending: Cardiology | Admitting: Cardiology

## 2021-05-22 ENCOUNTER — Inpatient Hospital Stay: Payer: Medicare Other | Admitting: Urgent Care

## 2021-05-22 ENCOUNTER — Inpatient Hospital Stay: Payer: Medicare Other

## 2021-05-22 ENCOUNTER — Encounter
Admission: RE | Disposition: A | Discharge status: Skilled Nursing Facility | Payer: Self-pay | Source: Home / Self Care | Attending: Vascular Surgery

## 2021-05-22 ENCOUNTER — Encounter: Payer: Self-pay | Admitting: Vascular Surgery

## 2021-05-22 ENCOUNTER — Inpatient Hospital Stay
Admission: RE | Admit: 2021-05-22 | Discharge: 2021-05-29 | DRG: 271 | Disposition: A | Discharge status: Skilled Nursing Facility | Payer: Medicare Other | Attending: Vascular Surgery | Admitting: Vascular Surgery

## 2021-05-22 DIAGNOSIS — Z7982 Long term (current) use of aspirin: Secondary | ICD-10-CM

## 2021-05-22 DIAGNOSIS — E782 Mixed hyperlipidemia: Secondary | ICD-10-CM | POA: Diagnosis present

## 2021-05-22 DIAGNOSIS — E1122 Type 2 diabetes mellitus with diabetic chronic kidney disease: Secondary | ICD-10-CM | POA: Diagnosis present

## 2021-05-22 DIAGNOSIS — E440 Moderate protein-calorie malnutrition: Secondary | ICD-10-CM | POA: Diagnosis present

## 2021-05-22 DIAGNOSIS — E1151 Type 2 diabetes mellitus with diabetic peripheral angiopathy without gangrene: Secondary | ICD-10-CM | POA: Diagnosis present

## 2021-05-22 DIAGNOSIS — F17210 Nicotine dependence, cigarettes, uncomplicated: Secondary | ICD-10-CM | POA: Diagnosis present

## 2021-05-22 DIAGNOSIS — Z79899 Other long term (current) drug therapy: Secondary | ICD-10-CM | POA: Diagnosis not present

## 2021-05-22 DIAGNOSIS — F319 Bipolar disorder, unspecified: Secondary | ICD-10-CM | POA: Diagnosis present

## 2021-05-22 DIAGNOSIS — N1831 Chronic kidney disease, stage 3a: Secondary | ICD-10-CM | POA: Diagnosis present

## 2021-05-22 DIAGNOSIS — Z6823 Body mass index (BMI) 23.0-23.9, adult: Secondary | ICD-10-CM

## 2021-05-22 DIAGNOSIS — T82856A Stenosis of peripheral vascular stent, initial encounter: Secondary | ICD-10-CM | POA: Diagnosis present

## 2021-05-22 DIAGNOSIS — Y838 Other surgical procedures as the cause of abnormal reaction of the patient, or of later complication, without mention of misadventure at the time of the procedure: Secondary | ICD-10-CM | POA: Diagnosis present

## 2021-05-22 DIAGNOSIS — I745 Embolism and thrombosis of iliac artery: Secondary | ICD-10-CM | POA: Diagnosis present

## 2021-05-22 DIAGNOSIS — Z20822 Contact with and (suspected) exposure to covid-19: Secondary | ICD-10-CM | POA: Diagnosis present

## 2021-05-22 DIAGNOSIS — F10239 Alcohol dependence with withdrawal, unspecified: Secondary | ICD-10-CM | POA: Diagnosis not present

## 2021-05-22 DIAGNOSIS — I4891 Unspecified atrial fibrillation: Secondary | ICD-10-CM

## 2021-05-22 DIAGNOSIS — Z9119 Patient's noncompliance with other medical treatment and regimen: Secondary | ICD-10-CM

## 2021-05-22 DIAGNOSIS — Z7902 Long term (current) use of antithrombotics/antiplatelets: Secondary | ICD-10-CM

## 2021-05-22 DIAGNOSIS — I70221 Atherosclerosis of native arteries of extremities with rest pain, right leg: Secondary | ICD-10-CM | POA: Diagnosis present

## 2021-05-22 DIAGNOSIS — Z95820 Peripheral vascular angioplasty status with implants and grafts: Secondary | ICD-10-CM | POA: Diagnosis not present

## 2021-05-22 DIAGNOSIS — I70212 Atherosclerosis of native arteries of extremities with intermittent claudication, left leg: Secondary | ICD-10-CM | POA: Diagnosis present

## 2021-05-22 DIAGNOSIS — I70229 Atherosclerosis of native arteries of extremities with rest pain, unspecified extremity: Secondary | ICD-10-CM | POA: Diagnosis present

## 2021-05-22 HISTORY — PX: INSERTION OF ILIAC STENT: SHX6256

## 2021-05-22 HISTORY — PX: ENDARTERECTOMY FEMORAL: SHX5804

## 2021-05-22 LAB — POCT I-STAT, CHEM 8
BUN: 34 mg/dL — ABNORMAL HIGH (ref 8–23)
Calcium, Ion: 1.27 mmol/L (ref 1.15–1.40)
Chloride: 103 mmol/L (ref 98–111)
Creatinine, Ser: 2 mg/dL — ABNORMAL HIGH (ref 0.61–1.24)
Glucose, Bld: 109 mg/dL — ABNORMAL HIGH (ref 70–99)
HCT: 36 % — ABNORMAL LOW (ref 39.0–52.0)
Hemoglobin: 12.2 g/dL — ABNORMAL LOW (ref 13.0–17.0)
Potassium: 3.7 mmol/L (ref 3.5–5.1)
Sodium: 135 mmol/L (ref 135–145)
TCO2: 24 mmol/L (ref 22–32)

## 2021-05-22 LAB — CBC
HCT: 30.1 % — ABNORMAL LOW (ref 39.0–52.0)
Hemoglobin: 10.4 g/dL — ABNORMAL LOW (ref 13.0–17.0)
MCH: 28.7 pg (ref 26.0–34.0)
MCHC: 34.6 g/dL (ref 30.0–36.0)
MCV: 83.1 fL (ref 80.0–100.0)
Platelets: 327 10*3/uL (ref 150–400)
RBC: 3.62 MIL/uL — ABNORMAL LOW (ref 4.22–5.81)
RDW: 15.4 % (ref 11.5–15.5)
WBC: 11.2 10*3/uL — ABNORMAL HIGH (ref 4.0–10.5)
nRBC: 0 % (ref 0.0–0.2)

## 2021-05-22 LAB — GLUCOSE, CAPILLARY: Glucose-Capillary: 134 mg/dL — ABNORMAL HIGH (ref 70–99)

## 2021-05-22 LAB — TYPE AND SCREEN
ABO/RH(D): A POS
Antibody Screen: NEGATIVE

## 2021-05-22 LAB — MRSA NEXT GEN BY PCR, NASAL: MRSA by PCR Next Gen: NOT DETECTED

## 2021-05-22 LAB — ABO/RH: ABO/RH(D): A POS

## 2021-05-22 LAB — CREATININE, SERUM
Creatinine, Ser: 1.71 mg/dL — ABNORMAL HIGH (ref 0.61–1.24)
GFR, Estimated: 40 mL/min — ABNORMAL LOW (ref >60)

## 2021-05-22 SURGERY — ENDARTERECTOMY, FEMORAL
Anesthesia: General | Site: Groin | Laterality: Right

## 2021-05-22 MED ORDER — ROCURONIUM BROMIDE 100 MG/10ML IV SOLN
INTRAVENOUS | Status: DC | PRN
  Administered 2021-05-22 (×2): 50 mg via INTRAVENOUS
  Administered 2021-05-22 (×2): 10 mg via INTRAVENOUS
  Administered 2021-05-22: 20 mg via INTRAVENOUS

## 2021-05-22 MED ORDER — DEXMEDETOMIDINE HCL IN NACL 200 MCG/50ML IV SOLN
INTRAVENOUS | Status: AC
  Filled 2021-05-22: qty 50

## 2021-05-22 MED ORDER — FAMOTIDINE 20 MG PO TABS
20.0000 mg | ORAL_TABLET | Freq: Once | ORAL | Status: DC
Start: 2021-05-22 — End: 2021-05-22

## 2021-05-22 MED ORDER — HEPARIN SODIUM (PORCINE) 1000 UNIT/ML IJ SOLN
INTRAMUSCULAR | Status: DC | PRN
  Administered 2021-05-22: 2000 [IU] via INTRAVENOUS
  Administered 2021-05-22: 6000 [IU] via INTRAVENOUS

## 2021-05-22 MED ORDER — CHLORHEXIDINE GLUCONATE CLOTH 2 % EX PADS
6.0000 | MEDICATED_PAD | Freq: Every day | CUTANEOUS | Status: DC
  Administered 2021-05-22 – 2021-05-23 (×2): 6 via TOPICAL

## 2021-05-22 MED ORDER — ROCURONIUM BROMIDE 10 MG/ML (PF) SYRINGE
PREFILLED_SYRINGE | INTRAVENOUS | Status: AC
  Filled 2021-05-22: qty 10

## 2021-05-22 MED ORDER — ORAL CARE MOUTH RINSE: 15.0000 mL | Freq: Once | OROMUCOSAL | Status: AC

## 2021-05-22 MED ORDER — LITHIUM CARBONATE 300 MG PO CAPS
300.0000 mg | ORAL_CAPSULE | Freq: Two times a day (BID) | ORAL | Status: DC
  Administered 2021-05-22 – 2021-05-29 (×14): 300 mg via ORAL
  Filled 2021-05-22 (×15): qty 1

## 2021-05-22 MED ORDER — SODIUM CHLORIDE 0.9 % IV SOLN: INTRAVENOUS | Status: DC | PRN

## 2021-05-22 MED ORDER — OXYCODONE HCL 5 MG PO TABS: 5.0000 mg | ORAL_TABLET | Freq: Once | ORAL | Status: DC | PRN

## 2021-05-22 MED ORDER — ONDANSETRON HCL 4 MG/2ML IJ SOLN
4.0000 mg | Freq: Four times a day (QID) | INTRAMUSCULAR | Status: DC | PRN
  Administered 2021-05-23 – 2021-05-25 (×2): 4 mg via INTRAVENOUS
  Filled 2021-05-22 (×2): qty 2

## 2021-05-22 MED ORDER — "VISTASEAL 4 ML SINGLE DOSE KIT "
PACK | CUTANEOUS | Status: DC | PRN
  Administered 2021-05-22: 4 mL via TOPICAL

## 2021-05-22 MED ORDER — PROMETHAZINE HCL 25 MG/ML IJ SOLN: 6.2500 mg | INTRAMUSCULAR | Status: DC | PRN

## 2021-05-22 MED ORDER — CHLORHEXIDINE GLUCONATE CLOTH 2 % EX PADS
6.0000 | MEDICATED_PAD | Freq: Once | CUTANEOUS | Status: AC
  Administered 2021-05-22: 6 via TOPICAL

## 2021-05-22 MED ORDER — LABETALOL HCL 5 MG/ML IV SOLN: 10.0000 mg | INTRAVENOUS | Status: DC | PRN

## 2021-05-22 MED ORDER — ACETAMINOPHEN 325 MG PO TABS: 325.0000 mg | ORAL_TABLET | ORAL | Status: DC | PRN

## 2021-05-22 MED ORDER — PHENYLEPHRINE HCL (PRESSORS) 10 MG/ML IV SOLN
INTRAVENOUS | Status: AC
  Filled 2021-05-22: qty 1

## 2021-05-22 MED ORDER — HEPARIN SODIUM (PORCINE) 5000 UNIT/ML IJ SOLN
INTRAMUSCULAR | Status: AC
  Filled 2021-05-22: qty 1

## 2021-05-22 MED ORDER — SODIUM CHLORIDE 0.9 % IV SOLN
INTRAVENOUS | Status: DC | PRN
  Administered 2021-05-22: 1000 mL

## 2021-05-22 MED ORDER — CEFAZOLIN SODIUM-DEXTROSE 2-4 GM/100ML-% IV SOLN
2.0000 g | INTRAVENOUS | Status: AC
  Administered 2021-05-22: 2 g via INTRAVENOUS

## 2021-05-22 MED ORDER — DOPAMINE-DEXTROSE 3.2-5 MG/ML-% IV SOLN: 3.0000 ug/kg/min | INTRAVENOUS | Status: DC

## 2021-05-22 MED ORDER — ENOXAPARIN SODIUM 40 MG/0.4ML IJ SOSY
40.0000 mg | PREFILLED_SYRINGE | INTRAMUSCULAR | Status: DC
  Administered 2021-05-23 – 2021-05-24 (×2): 40 mg via SUBCUTANEOUS
  Filled 2021-05-22 (×2): qty 0.4

## 2021-05-22 MED ORDER — SODIUM CHLORIDE 0.9 % IV SOLN: INTRAVENOUS | Status: DC

## 2021-05-22 MED ORDER — PHENOL 1.4 % MT LIQD
1.0000 | OROMUCOSAL | Status: DC | PRN
  Filled 2021-05-22: qty 177

## 2021-05-22 MED ORDER — SIMVASTATIN 20 MG PO TABS
20.0000 mg | ORAL_TABLET | Freq: Every day | ORAL | Status: DC
  Administered 2021-05-23 – 2021-05-29 (×7): 20 mg via ORAL
  Filled 2021-05-22 (×7): qty 1

## 2021-05-22 MED ORDER — HYDRALAZINE HCL 20 MG/ML IJ SOLN
5.0000 mg | INTRAMUSCULAR | Status: DC | PRN
Start: 2021-05-22 — End: 2021-05-29

## 2021-05-22 MED ORDER — ONDANSETRON HCL 4 MG/2ML IJ SOLN
INTRAMUSCULAR | Status: AC
  Filled 2021-05-22: qty 2

## 2021-05-22 MED ORDER — SUGAMMADEX SODIUM 200 MG/2ML IV SOLN
INTRAVENOUS | Status: DC | PRN
  Administered 2021-05-22: 200 mg via INTRAVENOUS

## 2021-05-22 MED ORDER — EPHEDRINE SULFATE 50 MG/ML IJ SOLN
INTRAMUSCULAR | Status: DC | PRN
  Administered 2021-05-22: 5 mg via INTRAVENOUS
  Administered 2021-05-22 (×2): 7.5 mg via INTRAVENOUS

## 2021-05-22 MED ORDER — MEPERIDINE HCL 25 MG/ML IJ SOLN: 6.2500 mg | INTRAMUSCULAR | Status: DC | PRN

## 2021-05-22 MED ORDER — CHLORHEXIDINE GLUCONATE 0.12 % MT SOLN: 15.0000 mL | Freq: Once | OROMUCOSAL | Status: AC

## 2021-05-22 MED ORDER — ACETAMINOPHEN 650 MG RE SUPP: 325.0000 mg | RECTAL | Status: DC | PRN

## 2021-05-22 MED ORDER — SODIUM CHLORIDE 0.9 % IV SOLN: 500.0000 mL | Freq: Once | INTRAVENOUS | Status: DC | PRN

## 2021-05-22 MED ORDER — FENTANYL CITRATE (PF) 100 MCG/2ML IJ SOLN: 25.0000 ug | INTRAMUSCULAR | Status: DC | PRN

## 2021-05-22 MED ORDER — LIDOCAINE HCL (CARDIAC) PF 100 MG/5ML IV SOSY
PREFILLED_SYRINGE | INTRAVENOUS | Status: DC | PRN
  Administered 2021-05-22: 80 mg via INTRAVENOUS

## 2021-05-22 MED ORDER — EPHEDRINE 5 MG/ML INJ
INTRAVENOUS | Status: AC
  Filled 2021-05-22: qty 5

## 2021-05-22 MED ORDER — OXYCODONE-ACETAMINOPHEN 5-325 MG PO TABS: 1.0000 | ORAL_TABLET | ORAL | Status: DC | PRN

## 2021-05-22 MED ORDER — DEXAMETHASONE SODIUM PHOSPHATE 10 MG/ML IJ SOLN
INTRAMUSCULAR | Status: DC | PRN
  Administered 2021-05-22: 5 mg via INTRAVENOUS

## 2021-05-22 MED ORDER — POTASSIUM CHLORIDE CRYS ER 20 MEQ PO TBCR: 20.0000 meq | EXTENDED_RELEASE_TABLET | Freq: Every day | ORAL | Status: DC | PRN

## 2021-05-22 MED ORDER — FAMOTIDINE 20 MG PO TABS
ORAL_TABLET | ORAL | Status: AC
  Filled 2021-05-22: qty 1

## 2021-05-22 MED ORDER — DEXMEDETOMIDINE (PRECEDEX) IN NS 20 MCG/5ML (4 MCG/ML) IV SYRINGE
PREFILLED_SYRINGE | INTRAVENOUS | Status: DC | PRN
  Administered 2021-05-22: 4 ug via INTRAVENOUS
  Administered 2021-05-22: 2 ug via INTRAVENOUS
  Administered 2021-05-22 (×6): 4 ug via INTRAVENOUS

## 2021-05-22 MED ORDER — SODIUM CHLORIDE 0.9 % IV SOLN
INTRAVENOUS | Status: DC | PRN
  Administered 2021-05-22: 10 ug/min via INTRAVENOUS

## 2021-05-22 MED ORDER — QUETIAPINE FUMARATE 25 MG PO TABS
50.0000 mg | ORAL_TABLET | Freq: Every day | ORAL | Status: DC
  Administered 2021-05-22 – 2021-05-28 (×7): 50 mg via ORAL
  Filled 2021-05-22 (×7): qty 2

## 2021-05-22 MED ORDER — ACETAMINOPHEN 10 MG/ML IV SOLN
INTRAVENOUS | Status: DC | PRN
  Administered 2021-05-22: 1000 mg via INTRAVENOUS

## 2021-05-22 MED ORDER — QUETIAPINE FUMARATE 200 MG PO TABS
200.0000 mg | ORAL_TABLET | Freq: Every day | ORAL | Status: DC
  Administered 2021-05-22 – 2021-05-28 (×7): 200 mg via ORAL
  Filled 2021-05-22 (×2): qty 1
  Filled 2021-05-22: qty 8
  Filled 2021-05-22: qty 1
  Filled 2021-05-22 (×2): qty 8
  Filled 2021-05-22: qty 1
  Filled 2021-05-22: qty 8
  Filled 2021-05-22: qty 1

## 2021-05-22 MED ORDER — SORBITOL 70 % SOLN
30.0000 mL | Freq: Every day | Status: DC | PRN
  Filled 2021-05-22: qty 30

## 2021-05-22 MED ORDER — LIDOCAINE HCL (PF) 2 % IJ SOLN
INTRAMUSCULAR | Status: AC
  Filled 2021-05-22: qty 5

## 2021-05-22 MED ORDER — NITROGLYCERIN IN D5W 200-5 MCG/ML-% IV SOLN: 5.0000 ug/min | INTRAVENOUS | Status: DC

## 2021-05-22 MED ORDER — MAGNESIUM SULFATE 2 GM/50ML IV SOLN
2.0000 g | Freq: Every day | INTRAVENOUS | Status: DC | PRN
Start: 2021-05-22 — End: 2021-05-29
  Filled 2021-05-22: qty 50

## 2021-05-22 MED ORDER — FENTANYL CITRATE (PF) 100 MCG/2ML IJ SOLN
INTRAMUSCULAR | Status: AC
  Filled 2021-05-22: qty 2

## 2021-05-22 MED ORDER — METOPROLOL TARTRATE 5 MG/5ML IV SOLN: 2.0000 mg | INTRAVENOUS | Status: DC | PRN

## 2021-05-22 MED ORDER — SODIUM CHLORIDE 0.9 % IV SOLN
INTRAVENOUS | Status: DC | PRN
  Administered 2021-05-22: 500 mL via INTRAMUSCULAR

## 2021-05-22 MED ORDER — SEVOFLURANE IN SOLN
RESPIRATORY_TRACT | Status: AC
  Filled 2021-05-22: qty 250

## 2021-05-22 MED ORDER — ASPIRIN EC 81 MG PO TBEC
81.0000 mg | DELAYED_RELEASE_TABLET | Freq: Every day | ORAL | Status: DC
  Administered 2021-05-22 – 2021-05-29 (×8): 81 mg via ORAL
  Filled 2021-05-22 (×8): qty 1

## 2021-05-22 MED ORDER — HEMOSTATIC AGENTS (NO CHARGE) OPTIME
TOPICAL | Status: DC | PRN
  Administered 2021-05-22: 1 via TOPICAL

## 2021-05-22 MED ORDER — GUAIFENESIN-DM 100-10 MG/5ML PO SYRP: 15.0000 mL | ORAL_SOLUTION | ORAL | Status: DC | PRN

## 2021-05-22 MED ORDER — LACTATED RINGERS IV SOLN: INTRAVENOUS | Status: DC

## 2021-05-22 MED ORDER — ACETAMINOPHEN 10 MG/ML IV SOLN
INTRAVENOUS | Status: AC
  Filled 2021-05-22: qty 100

## 2021-05-22 MED ORDER — FAMOTIDINE 20 MG IN NS 100 ML IVPB
20.0000 mg | INTRAVENOUS | Status: DC
  Administered 2021-05-22: 20 mg via INTRAVENOUS
  Filled 2021-05-22: qty 100

## 2021-05-22 MED ORDER — MORPHINE SULFATE (PF) 2 MG/ML IV SOLN: 2.0000 mg | INTRAVENOUS | Status: DC | PRN

## 2021-05-22 MED ORDER — ALUM & MAG HYDROXIDE-SIMETH 200-200-20 MG/5ML PO SUSP: 15.0000 mL | ORAL | Status: DC | PRN

## 2021-05-22 MED ORDER — DEXAMETHASONE SODIUM PHOSPHATE 10 MG/ML IJ SOLN
INTRAMUSCULAR | Status: AC
  Filled 2021-05-22: qty 1

## 2021-05-22 MED ORDER — 0.9 % SODIUM CHLORIDE (POUR BTL) OPTIME
TOPICAL | Status: DC | PRN
  Administered 2021-05-22: 250 mL

## 2021-05-22 MED ORDER — CHLORHEXIDINE GLUCONATE 0.12 % MT SOLN
OROMUCOSAL | Status: AC
  Administered 2021-05-22: 15 mL via OROMUCOSAL
  Filled 2021-05-22: qty 15

## 2021-05-22 MED ORDER — SODIUM CHLORIDE FLUSH 0.9 % IV SOLN
INTRAVENOUS | Status: AC
  Filled 2021-05-22: qty 10

## 2021-05-22 MED ORDER — ONDANSETRON HCL 4 MG/2ML IJ SOLN
INTRAMUSCULAR | Status: DC | PRN
  Administered 2021-05-22: 4 mg via INTRAVENOUS

## 2021-05-22 MED ORDER — CEFAZOLIN SODIUM-DEXTROSE 2-4 GM/100ML-% IV SOLN
INTRAVENOUS | Status: AC
  Administered 2021-05-22: 2 g via INTRAVENOUS
  Filled 2021-05-22: qty 100

## 2021-05-22 MED ORDER — PHENYLEPHRINE HCL (PRESSORS) 10 MG/ML IV SOLN
INTRAVENOUS | Status: DC | PRN
  Administered 2021-05-22: 100 ug via INTRAVENOUS
  Administered 2021-05-22: 200 ug via INTRAVENOUS
  Administered 2021-05-22 (×2): 100 ug via INTRAVENOUS
  Administered 2021-05-22: 200 ug via INTRAVENOUS
  Administered 2021-05-22 (×5): 100 ug via INTRAVENOUS
  Administered 2021-05-22: 200 ug via INTRAVENOUS
  Administered 2021-05-22 (×6): 100 ug via INTRAVENOUS

## 2021-05-22 MED ORDER — PROPOFOL 10 MG/ML IV BOLUS
INTRAVENOUS | Status: AC
  Filled 2021-05-22: qty 20

## 2021-05-22 MED ORDER — PROPOFOL 10 MG/ML IV BOLUS
INTRAVENOUS | Status: DC | PRN
  Administered 2021-05-22: 20 mg via INTRAVENOUS
  Administered 2021-05-22: 100 mg via INTRAVENOUS

## 2021-05-22 MED ORDER — SENNOSIDES-DOCUSATE SODIUM 8.6-50 MG PO TABS: 1.0000 | ORAL_TABLET | Freq: Every evening | ORAL | Status: DC | PRN

## 2021-05-22 MED ORDER — DOCUSATE SODIUM 100 MG PO CAPS
100.0000 mg | ORAL_CAPSULE | Freq: Every day | ORAL | Status: DC
  Administered 2021-05-23 – 2021-05-29 (×7): 100 mg via ORAL
  Filled 2021-05-22 (×7): qty 1

## 2021-05-22 MED ORDER — CEFAZOLIN SODIUM-DEXTROSE 2-4 GM/100ML-% IV SOLN
2.0000 g | Freq: Three times a day (TID) | INTRAVENOUS | Status: AC
Start: 2021-05-22 — End: 2021-05-23
  Administered 2021-05-23: 2 g via INTRAVENOUS
  Filled 2021-05-22 (×2): qty 100

## 2021-05-22 MED ORDER — FAMOTIDINE 20 MG PO TABS
20.0000 mg | ORAL_TABLET | Freq: Every day | ORAL | Status: DC
  Administered 2021-05-23 – 2021-05-29 (×7): 20 mg via ORAL
  Filled 2021-05-22 (×7): qty 1

## 2021-05-22 MED ORDER — PAROXETINE HCL 20 MG PO TABS
20.0000 mg | ORAL_TABLET | Freq: Every day | ORAL | Status: DC
  Administered 2021-05-23 – 2021-05-29 (×7): 20 mg via ORAL
  Filled 2021-05-22 (×7): qty 1

## 2021-05-22 MED ORDER — SPIRITUS FRUMENTI
1.0000 | Freq: Three times a day (TID) | ORAL | Status: DC
  Filled 2021-05-22 (×2): qty 1

## 2021-05-22 MED ORDER — FENTANYL CITRATE (PF) 100 MCG/2ML IJ SOLN
INTRAMUSCULAR | Status: DC | PRN
  Administered 2021-05-22 (×2): 50 ug via INTRAVENOUS

## 2021-05-22 MED ORDER — OXYCODONE HCL 5 MG/5ML PO SOLN: 5.0000 mg | Freq: Once | ORAL | Status: DC | PRN

## 2021-05-22 MED ORDER — HEPARIN SODIUM (PORCINE) 1000 UNIT/ML IJ SOLN
INTRAMUSCULAR | Status: AC
  Filled 2021-05-22: qty 1

## 2021-05-22 SURGICAL SUPPLY — 98 items
ADH SKN CLS APL DERMABOND .7 (GAUZE/BANDAGES/DRESSINGS) ×3
APL PRP STRL LF DISP 70% ISPRP (MISCELLANEOUS) ×6
BAG DECANTER FOR FLEXI CONT (MISCELLANEOUS) ×4 IMPLANT
BALLN DORADO 8X100X80 (BALLOONS) ×4
BALLN LUTONIX  018 4X60X130 (BALLOONS) ×4
BALLN LUTONIX 018 4X60X130 (BALLOONS) ×3
BALLN LUTONIX 018 6X300X130 (BALLOONS) ×4
BALLN LUTONIX DCB 7X60X130 (BALLOONS) ×4
BALLOON DORADO 8X100X80 (BALLOONS) ×2 IMPLANT
BALLOON LUTONIX 018 4X60X130 (BALLOONS) IMPLANT
BALLOON LUTONIX 018 6X300X130 (BALLOONS) IMPLANT
BALLOON LUTONIX DCB 7X60X130 (BALLOONS) ×2 IMPLANT
BLADE SURG 15 STRL LF DISP TIS (BLADE) ×3 IMPLANT
BLADE SURG 15 STRL SS (BLADE) ×4
BLADE SURG SZ11 CARB STEEL (BLADE) ×4 IMPLANT
BOOT SUTURE AID YELLOW STND (SUTURE) ×5 IMPLANT
BRUSH SCRUB EZ  4% CHG (MISCELLANEOUS) ×4
BRUSH SCRUB EZ 4% CHG (MISCELLANEOUS) ×3 IMPLANT
CATH BEACON 5 .035 40 KMP TP (CATHETERS) IMPLANT
CATH BEACON 5 .038 40 KMP TP (CATHETERS) ×4
CATH EMBOLECTOMY 3X80 (CATHETERS) ×3 IMPLANT
CHLORAPREP W/TINT 26 (MISCELLANEOUS) ×6 IMPLANT
COVER LIGHT HANDLE STERIS (MISCELLANEOUS) ×1 IMPLANT
DERMABOND ADVANCED (GAUZE/BANDAGES/DRESSINGS) ×1
DERMABOND ADVANCED .7 DNX12 (GAUZE/BANDAGES/DRESSINGS) ×3 IMPLANT
DEVICE STARCLOSE SE CLOSURE (Vascular Products) ×1 IMPLANT
DRAPE C-ARM XRAY 36X54 (DRAPES) ×4 IMPLANT
DRAPE INCISE IOBAN 66X45 STRL (DRAPES) ×4 IMPLANT
DRESSING SURGICEL FIBRLLR 1X2 (HEMOSTASIS) ×2 IMPLANT
DRSG OPSITE POSTOP 4X6 (GAUZE/BANDAGES/DRESSINGS) ×3 IMPLANT
DRSG SURGICEL FIBRILLAR 1X2 (HEMOSTASIS) ×4
ELECT CAUTERY BLADE 6.4 (BLADE) ×4 IMPLANT
ELECT REM PT RETURN 9FT ADLT (ELECTROSURGICAL) ×8
ELECTRODE REM PT RTRN 9FT ADLT (ELECTROSURGICAL) ×4 IMPLANT
GLIDEWIRE ADV .035X180CM (WIRE) ×6 IMPLANT
GLOVE SURG SYN 7.0 (GLOVE) ×8 IMPLANT
GLOVE SURG SYN 7.0 PF PI (GLOVE) ×6 IMPLANT
GLOVE SURG UNDER LTX SZ7.5 (GLOVE) ×10 IMPLANT
GOWN STRL REUS W/ TWL LRG LVL3 (GOWN DISPOSABLE) ×3 IMPLANT
GOWN STRL REUS W/ TWL XL LVL3 (GOWN DISPOSABLE) ×6 IMPLANT
GOWN STRL REUS W/TWL LRG LVL3 (GOWN DISPOSABLE) ×8
GOWN STRL REUS W/TWL XL LVL3 (GOWN DISPOSABLE) ×8
GUIDEWIRE ADV .018X180CM (WIRE) ×3 IMPLANT
HEMOSTAT SURGICEL 2X3 (HEMOSTASIS) ×3 IMPLANT
IV NS 500ML (IV SOLUTION) ×4
IV NS 500ML BAXH (IV SOLUTION) ×3 IMPLANT
KIT ENCORE 26 ADVANTAGE (KITS) ×2 IMPLANT
KIT MICROPUNCTURE NIT STIFF (SHEATH) ×2 IMPLANT
KIT TURNOVER KIT A (KITS) ×4 IMPLANT
LABEL OR SOLS (LABEL) ×4 IMPLANT
LOOP RED MAXI  1X406MM (MISCELLANEOUS) ×4
LOOP VESSEL MAXI  1X406 RED (MISCELLANEOUS) ×12
LOOP VESSEL MAXI 1X406 RED (MISCELLANEOUS) ×6 IMPLANT
LOOP VESSEL MINI 0.8X406 BLUE (MISCELLANEOUS) ×8 IMPLANT
LOOPS BLUE MINI 0.8X406MM (MISCELLANEOUS) ×3
MANIFOLD NEPTUNE II (INSTRUMENTS) ×4 IMPLANT
NDL ENTRY 21GA 7CM ECHOTIP (NEEDLE) IMPLANT
NEEDLE ENTRY 21GA 7CM ECHOTIP (NEEDLE) ×4 IMPLANT
NS IRRIG 500ML POUR BTL (IV SOLUTION) ×4 IMPLANT
PACK ANGIOGRAPHY (CUSTOM PROCEDURE TRAY) ×3 IMPLANT
PACK BASIN MAJOR ARMC (MISCELLANEOUS) ×4 IMPLANT
PACK UNIVERSAL (MISCELLANEOUS) ×4 IMPLANT
PATCH CAROTID ECM VASC 1X10 (Prosthesis & Implant Heart) ×4 IMPLANT
PENCIL ELECTRO HAND CTR (MISCELLANEOUS) ×1 IMPLANT
SET CATH ROTAREX 8FR 85 (CATHETERS) ×3 IMPLANT
SET WALTER ACTIVATION W/DRAPE (SET/KITS/TRAYS/PACK) ×4 IMPLANT
SHEATH BRITE TIP 7FRX11 (SHEATH) ×2 IMPLANT
SHEATH BRITE TIP 8FRX11 (SHEATH) ×3 IMPLANT
SPONGE T-LAP 18X18 ~~LOC~~+RFID (SPONGE) ×8 IMPLANT
STENT LIFESTREAM 8X58X80 (Permanent Stent) ×4 IMPLANT
STENT LIFESTREAM 9X38X80 (Permanent Stent) ×3 IMPLANT
STENT LIFESTREAM 9X58X80 (Permanent Stent) ×1 IMPLANT
STENT VIABAHN 6X250X120 (Permanent Stent) ×1 IMPLANT
STENT VIABAHN 8X100X120 (Permanent Stent) ×4 IMPLANT
STENT VIABAHN 8X10X120 (Permanent Stent) ×2 IMPLANT
STENT VIABAHN 8X250X120 (Permanent Stent) ×1 IMPLANT
SUT MNCRL 4-0 (SUTURE) ×8
SUT MNCRL 4-0 27XMFL (SUTURE) ×6
SUT PROLENE 5 0 RB 1 DA (SUTURE) ×11 IMPLANT
SUT PROLENE 6 0 BV (SUTURE) ×20 IMPLANT
SUT PROLENE 7 0 BV 1 (SUTURE) ×12 IMPLANT
SUT SILK 2 0 (SUTURE) ×4
SUT SILK 2-0 18XBRD TIE 12 (SUTURE) ×3 IMPLANT
SUT SILK 3 0 (SUTURE) ×4
SUT SILK 3-0 18XBRD TIE 12 (SUTURE) ×3 IMPLANT
SUT SILK 4 0 (SUTURE) ×4
SUT SILK 4-0 18XBRD TIE 12 (SUTURE) ×3 IMPLANT
SUT VIC AB 2-0 CT1 27 (SUTURE) ×8
SUT VIC AB 2-0 CT1 TAPERPNT 27 (SUTURE) ×6 IMPLANT
SUT VIC AB 3-0 SH 27 (SUTURE) ×8
SUT VIC AB 3-0 SH 27X BRD (SUTURE) ×5 IMPLANT
SUT VICRYL+ 3-0 36IN CT-1 (SUTURE) ×8 IMPLANT
SUTURE MNCRL 4-0 27XMF (SUTURE) ×4 IMPLANT
SYR 20ML LL LF (SYRINGE) ×4 IMPLANT
SYR 5ML LL (SYRINGE) ×5 IMPLANT
TRAY FOLEY MTR SLVR 16FR STAT (SET/KITS/TRAYS/PACK) ×4 IMPLANT
WIRE G 018X200 V18 (WIRE) ×1 IMPLANT
WIRE G V18X300CM (WIRE) ×1 IMPLANT

## 2021-05-22 NOTE — Op Note (Addendum)
OPERATIVE NOTE   PROCEDURE: Right common femoral, superficial femoral and profunda femoris endarterectomy with Cormatrix patch angioplasty. Percutaneous transluminal angioplasty and stent placement left common iliac artery. Open transluminal angioplasty and stent placement right common iliac artery with additional right external iliac artery stenting. Rota Rex thrombectomy right external and common iliac arteries Introduction catheter into the aorta right and left femoral approaches with percutaneous ultrasound-guided access to the left femoral artery. Introduction catheter into the distal right lower extremity vasculature right femoral approach Open angioplasty and stent placement right popliteal and superficial femoral arteries.  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  ASSISTANT(S): None  ANESTHESIA: general  ESTIMATED BLOOD LOSS: 200 cc  FINDING(S): Profound calcific plaque noted of the right common femoral extending beyond the initial bifurcation of the profunda femoris arteries as well as down and extensive length of the right SFA with occlusion of the SFA popliteal as well as the right common and external iliac arteries and a greater than 80% stenosis of the left common iliac artery.  Previously placed stents in all of the above-noted vascular beds.  SPECIMEN(S):  Calcific plaque from the common femoral, superficial femoral and the profunda femoris artery  INDICATIONS:   Keith Levy 80 y.o. y.o.male who presents with complaints of lifestyle limiting claudication and pain continuously in the right lower extremity.  Lifestyle limiting claudication bilateral lower extremities.  The patient has documented severe  atherosclerotic occlusive disease and has undergone minimally invasive treatments in the past. However, at this point his primary area of stricture stenosis resides in the common femoral and origins of the superficial femoral and profunda femoris extending into these arteries and therefore this is not amenable to intervention. The patient is therefore undergoing open endarterectomy. The risks and benefits of surgery have been reviewed with the patient, all questions have answered; alternative therapies have been reviewed as well and the patient has agreed to proceed with surgical open repair.  DESCRIPTION: After obtaining full informed written consent, the patient was brought back to the operating room and placed supine upon the operating table.  The patient received IV antibiotics prior to induction.  After obtaining adequate anesthesia, the patient was prepped and draped in the standard fashion for right femoral exposure.  Co-surgeons are required because this is a complicated procedure with work being performed simultaneously from both the patient's right left sides.  This also expedites the procedure making a shorter operative time reducing complications and improving patient safety.  Attention was turned to the right groin with Dr. Lucky Cowboy working on the patient's right and myself working on the left of the patient.  Vertical  Incision was made over the right common femoral artery and dissection carried down to the common femoral artery with electrocautery.  I dissected out the common femoral artery from the distal external iliac artery (identified by the superficial circumflex vessels) down to the femoral bifurcation.  On initial inspection, the common femoral artery was: densely calcified and there was no palpable pulse noted.    Subsequently the dissection was continued to include all circumflex branches and the profunda femoral artery and superficial femoral artery. The superficial femoral artery was  dissected circumferentially for a distance of approximately 3-4 cm and the profunda femoris was dissected circumferentially out to the fourth order branches individual vessel loops were placed around each branch.  Control of all branches was obtained with vessel loops.  A softer area in the distal external iliac artery amendable to clamping was identified.    The patient was given 5000 units of Heparin intravenously (later in the case additional heparin was given), which was a therapeutic bolus.   After waiting 3 minutes, the distal external iliac artery was clamped and all of the vessel loops were placed under tension.  Arteriotomy was made in the common femoral artery with a 11-blade and extended it with a Potts scissor proximally and distally extending the distal end down the SFA for approximately 3 cm.   Endarterectomy was then performed under direct visualization using a freer elevator and a right angle from the mid common femoral extending up both proximally and distally. Proximally the endarterectomy was brought up to the level of the clamp where a clean edge was obtained. Distally the endarterectomy was carried down the SFA where a previously placed life stent was encountered and this was removed with the endarterectomy specimen.  Secondarily a previously placed Viabahn stent was encountered and this appeared to be intact and served as a feathered edge.  7-0 Prolene interrupted tacking sutures were placed to secure the leading edge of the Viabahn stent in the SFA.  The profunda femoris was treated with an eversion technique extending endarterectomy approximately 4 cm distally again obtaining a featheredge on the right.  This was well passed the first major bifurcation of the profunda femoris  At this point, a corematrix patch was fashioned for the geometry of the arteriotomy.  The patch was sewn to the artery with 2 running stitches of 6-0 Prolene, running from each end.  Prior to completing the  patch angioplasty a gap was left in the medial wall to allow for placement of the 8 French sheath, the profunda femoral artery was flushed as was the superficial femoral artery. The system was then forward flushed. The endarterectomy site was then irrigated copiously with heparinized saline.  We then placed an 8 French sheath in a retrograde direction.  At this point with myself working on the patient's left and Dr. Lucky Cowboy continuing to work from the patient's right I accessed the left common femoral.  Ultrasound was delivered onto the sterile field after being placed in a sterile sleeve.  Image of the femoral artery was obtained and it was recorded for the permanent record.  Access was obtained with a micropuncture needle under direct ultrasound visualization.  Microwire followed by micro sheath J-wire followed by a 7 French sheath was then inserted.  Advantage wire was then negotiated through the stenosis and into the aorta.  I then assisted Dr. Lucky Cowboy with negotiating the wire and a Kumpe catheter through the occluded right iliac arteries.  I provided tension on the wire stability of the catheters as well as maintaining traction on the sheath.  We then verified intraluminal positioning within the aorta from the right side by hand-injection through the Kumpe.  Next the [place] catheter was prepped on the field and then advanced through the 8 [place] sheath and mechanical thrombectomy was performed of the external iliac artery on the right as well as the common iliac artery on the right.  Again I was responsible for maintaining wire control and appropriate tension during this procedure.  The initial stent selected were 8 mm x 58 mm lifestream stents.  One stent was brought up the right while the other was brought up the left we elevated the aortic bifurcation  approximately 1 cm and then deployed the stents simultaneously in the kissing technique.  We then obliqued the detector and image the left iliac  bifurcation.  Based on this image a second 9 mm x 37 mm lifestream stent was selected and deployed extending the left common iliac stent almost down to the bifurcation.  The 9 mm balloon was left in place at this point we turned our attention to the right side where an 8 mm x 10 cm Viabahn stent was then deployed with the distal edge just at the level of the top of the patch.  The gap between the Viabahn and the initial lifestream stent was then treated with a 9 mm x 58 mm lifestream stent.  The 9 mm balloon once it was deflated after the initial inflation was then advanced up into the original 8 mm lifestream stents as was my balloon on the left and we again inflated the 9 mm balloon simultaneously in the kissing technique reexpanding the original 8 mm stents to a full 9 mm.  Follow-up imaging by hand-injection through both sheaths demonstrated the distal aorta was now well reconstructed there is less than 10% residual stenosis noted in the left common iliac system and the right common and external iliac arteries have been treated from a total occlusion to less than 10% residual stenosis throughout.  At this point an oblique view of the left groin was obtained and a Star close device to deployed and pressure was held for approximately 5 minutes.  On the right the wire and sheath were removed and the 8 French sheath was reinserted in the antegrade direction.  With myself assisting Dr. Lucky Cowboy by maintaining tension on wires and controlling catheters Dr. Lucky Cowboy was able to cross the SFA popliteal occlusion.  He used an advantage wire with a Kumpe catheter.  Kumpe catheter was positioned in the distal popliteal where hand-injection contrast demonstrated patency of the distal popliteal with patency of the trifurcation and two-vessel runoff to the foot.  A 6 mm x 25 cm Viabahn stent was then deployed with its distal margin just below the level of the tibial plateau within the popliteal and next an 8 mm x 25 cm Viabahn was  deployed with its proximal leading edge at the level of the distal patch in the SFA.  The proximal portion of the Viabahn stent in the SFA was postdilated with an 8 mm balloon the midportion was postdilated with a 6 mm balloon and the distal edge was postdilated with a 4 mm Lutonix balloon.  Inflations were to 10 to 12 atm for approximately 1 minute.  Follow-up imaging now demonstrated less than 10% residual stenosis throughout the entire SFA popliteal arteries with preservation of the trifurcation and distal runoff.  At this point we elected to terminate the procedure having successfully revascularized both his inflow as well as his outflow.  The sheath and wire were then removed and the femoral endarterectomy was once again controlled with Silastic Vesseloops.  The patch angioplasty was completed in the usual fashion.  Flow was then reestablished first to the profunda femoris and then the superficial femoral artery. Any gaps or bleeding sites in the suture line were easily controlled with a 6-0 Prolene suture.   The right groin was then irrigated copiously with sterile saline and subsequently Vistaseal and Surgicel were placed in the wound. The incision was repaired with a double layer of 2-0 Vicryl, a double layer of 3-0 Vicryl, and a layer of 4-0 Monocryl  in a subcuticular fashion.  The skin was cleaned, dried, and reinforced with Dermabond.  COMPLICATIONS: None  CONDITION: Keith Levy, M.D. [facility] Office: [phone]  05/22/2021, 1:19 PM

## 2021-05-22 NOTE — Anesthesia Procedure Notes (Signed)
Arterial Line Insertion Start/End9/03/2021 8:35 AM, 05/22/2021 8:42 AM Performed by: Emmie Niemann, MD, Trell Secrist, Einar Grad, CRNA, CRNA  Preanesthetic checklist: patient identified, IV checked, site marked, risks and benefits discussed, surgical consent, monitors and equipment checked, pre-op evaluation, timeout performed and anesthesia consent Left, radial was placed Catheter size: 20 G Hand hygiene performed  and Seldinger technique used Allen's test indicative of satisfactory collateral circulation Attempts: 1 Procedure performed without using ultrasound guided technique. Following insertion, dressing applied and Biopatch. Post procedure assessment: normal  Patient tolerated the procedure well with no immediate complications.

## 2021-05-22 NOTE — Op Note (Addendum)
OPERATIVE NOTE   PROCEDURE: 1.   Right common femoral, profunda femoris, and superficial femoral artery endarterectomies and patch angioplasty 2.   Catheter placement to the aorta from bilateral femoral approaches 3.   Aortogram and iliofemoral angiogram bilateral 4.  Kissing balloon stent placement to bilateral common iliac arteries with 8 mm diameter by 58 mm length lifestream stents. 5.  Additional stent placement to left common iliac artery with 9 mm diameter by 37 mm length lifestream stent 6.  Additional stent placement to the right external iliac artery with 8 mm diameter by 10 cm length Viabahn stent and then additional stent in the distal common iliac artery and proximal external iliac artery with a 9 mm diameter by 58 mm length lifestream stent 7.  Right lower extremity angiogram 8.  Stent placement x2 to the right SFA and popliteal with a 6 mm diameter by 25 cm length Viabahn stent and an 8 mm diameter by 25 cm length Viabahn stent 9.  Mechanical thrombectomy of the right common and external iliac arteries with the Rota Rex 8 device    PRE-OPERATIVE DIAGNOSIS: 1.Atherosclerotic occlusive disease right lower extremities with rest pain on the right and claudication on the left   POST-OPERATIVE DIAGNOSIS: Same  SURGEON: [name], MD  CO-surgeon:  Hortencia Pilar, MD  ANESTHESIA:  general  ESTIMATED BLOOD LOSS: 200 cc  FINDING(S): 1.  significant plaque in right common femoral, profunda femoris, and superficial femoral arteries  SPECIMEN(S):  Right common femoral, profunda femoris, and superficial femoral artery plaque.  INDICATIONS:    Patient presents with ischemic symptoms of both lower extremities worse on the left.  He had a recent angiogram and was found to have occlusion of his right iliac stents, right SFA stents, and common femoral disease on the right as well as recurrent stenosis of the left iliac artery.  Right femoral endarterectomy as well as extensive  endoluminal intervention is planned to try to improve perfusion.  The risks and benefits as well as alternative therapies including intervention were reviewed in detail all questions were answered the patient agrees to proceed with surgery.  DESCRIPTION: After obtaining full informed written consent, the patient was brought back to the operating room and placed supine upon the operating table.  The patient received IV antibiotics prior to induction.  After obtaining adequate anesthesia, the patient was prepped and draped in the standard fashion appropriate time out is called.    Vertical incision was created overlying the right femoral arteries. The common femoral artery proximally, and superficial femoral artery, and primary profunda femoris artery branches were encircled with vessel loops and prepared for control.  The dissection was carried out much further down the SFA than typical due to the previously placed stents in the severe disease requiring extensive endarterectomy 5 to 6 cm into the right SFA.  The right femoral arteries were found to have significant plaque from the common femoral artery into the profunda and superficial femoral arteries.   6000 units of heparin was given and allowed circulate for 5 minutes.   Attention is then turned to the right femoral artery.  An arteriotomy is made with 11 blade and extended with Potts scissors in the common femoral artery and carried down onto the first 4-5 cm of the superficial femoral artery. An endarterectomy was then performed. The [facility] was used to create a plane. The proximal endpoint was cut flush with tenotomy scissors. This was in the proximal common femoral artery.  There was thrombus  at the proximal endpoint as well consistent with his iliac occlusion.  An eversion endarterectomy was then performed for the first 2-3 cm of the profunda femoris artery into the primary branches. Good backbleeding was then seen. The distal endpoint of  the superficial femoral artery endarterectomy was a difficult endpoint secondary to the previously placed stents.  The previous life stent that was the proximal SFA was removed and parts of this were spent as specimen.  The SFA stent that was placed most recently which was a Viabahn stent was also identified and it was trimmed back about 3 to 4 cm to match the distal endpoint.  There was also extensive calcific plaque that was removed with a Soil scientist as an MA.  The existing Viabahn stent was tacked down with four 6-0 Prolene's to match the wall.  This was a fairly extensive endarterectomy and was more difficult than typical due to the previously placed stents and the severe disease.  I then used a 3 Fogarty embolectomy balloon and made 3 passes distally in the SFA down to the popliteal artery where the balloon would not pass about 30 cm due to the occlusion just distal to the previously placed stents.  These passes returned a large amount of chronic appearing thrombus as well as some pressure thrombus but there was still no backbleeding.  Repleted.  The Cormatrix patcth is then selected and prepared for a patch angioplasty.  It is cut and beveled and started at the proximal endpoint with a 6-0 Prolene suture.  Approximately one half of the suture line is run medially and laterally and the distal end point was cut and bevelled to match the arteriotomy.  A second 6-0 Prolene was started at the distal end point and run to the mid portion to complete the arteriotomy leaving a gap for sheath placement to address both the iliac and the SFA disease.  At this point, Dr. Delana Meyer accessed the left femoral artery under direct ultrasound without difficulty with a micropuncture needle and upsized to a 7 French sheath in the left femoral artery.  I placed an 8 French sheath up through the gap in the anastomosis retrograde to address the iliacs.  Dr. Delana Meyer cross the stenosis in the left common iliac artery without  difficulty brought a wire and catheter into the aorta.  I then crossed the occluded stents in the right external and common iliac arteries without difficulty and confirm intraluminal flow in the aorta.  I then performed mechanical thrombectomy with the Rota Rex device in the right common and external iliac arteries with significant amount of thrombus removed and a channel restored within the stents but there remained significant stenosis throughout the stents and a high-grade stenosis at the proximal edge of the previous right common iliac artery stent that was just into the distal aorta.  We then selected a 8 mm diameter by 58 mm length lifestream stents bilaterally to rebuild the distal aorta of about a centimeter into the distal aorta.  An additional 9 mm diameter by 37 mm length lifestream stent was then placed in the left common iliac artery stent just above the hypogastric artery.  On the right side, with disease extending down to the distal external iliac artery, an 8 mm diameter by 10 cm length Viabahn stent was deployed from just above the femoral head to the distal common iliac artery.  I then bridged with a 9 mm diameter by 58 mm length lifestream stent in the distal common iliac  artery down to the proximal external iliac artery to bridge the 2 status.  The proximal portion was then postdilated with 9 mm balloons as well and I postdilated Viabahn stent with an 8 mm balloon completion imaging showed excellent angiographic completion result with less than 10% residual stenosis in both common iliac arteries and the right external iliac artery.  There is no further disease in the left external iliac artery required treatment.  The sheath was removed from the left femoral artery and StarClose closure device deployed in the usual fashion with excellent hemostatic result.  I then clipped the 8 French sheath in antegrade fashion to address SFA disease and popliteal disease on the right.  I then easily cross the  SFA and popliteal occlusions confirming intraluminal flow in the below-knee popliteal artery.  I used a Kumpe catheter and advantage wire and then exchanged for a V 18 wire.  A 6 mm diameter by 25 cm Viabahn stent was then deployed from below the knee popliteal artery up to the mid SFA.  8 mm diameter by 25 cm Viabahn stent was then deployed up to the proximal SFA at the distal edge of her endarterectomy.  These were postdilated with a 4 mm Lutonix balloon distally, a 6 mm balloon in the midportion, and an 8 mm balloon proximally.  Completion imaging showed markedly improved flow with preserved distal runoff of the 2 vessels and less than 10% residual stenosis in the SFA and popliteal arteries that were treated.  The sheath and wire were then removed.  The vessel was flushed prior to release of control and completion of the anastomosis.  At this point, flow was established first to the profunda femoris artery and then to the superficial femoral artery. Easily palpable pulses are noted well beyond the anastomosis and both arteries.  Fibrillar and Vistacel topical hemostatic agents were placed in the femoral incision and hemostasis was complete. The femoral incision was then closed in a layered fashion with 2 layers of 2-0 Vicryl, 2 layers of 3-0 Vicryl, and 4-0 Monocryl for the skin closure. Dermabond and sterile dressing were then placed over the incision.  The patient was then awakened from anesthesia and taken to the recovery room in stable condition having tolerated the procedure well.  COMPLICATIONS: None  CONDITION: Stable     [name] 05/22/2021 12:50 PM   This note was created with Dragon Medical transcription system. Any errors in dictation are purely unintentional.

## 2021-05-22 NOTE — Anesthesia Preprocedure Evaluation (Signed)
Anesthesia Evaluation  Patient identified by MRN, date of birth, ID band Patient awake    Reviewed: Allergy & Precautions, NPO status , Patient's Chart, lab work & pertinent test results  History of Anesthesia Complications Negative for: history of anesthetic complications  Airway Mallampati: II  TM Distance: >3 FB Neck ROM: Full    Dental  (+) Edentulous Upper, Edentulous Lower   Pulmonary neg sleep apnea, COPD, Current Smoker and Patient abstained from smoking.,    breath sounds clear to auscultation- rhonchi (-) wheezing      Cardiovascular Exercise Tolerance: Good (-) hypertension+ Peripheral Vascular Disease  (-) CAD, (-) Past MI, (-) Cardiac Stents and (-) CABG + dysrhythmias Atrial Fibrillation  Rhythm:Regular Rate:Normal - Systolic murmurs and - Diastolic murmurs    Neuro/Psych neg Seizures PSYCHIATRIC DISORDERS Depression Bipolar Disorder negative neurological ROS     GI/Hepatic negative GI ROS, Neg liver ROS,   Endo/Other  diabetes  Renal/GU CRFRenal disease     Musculoskeletal negative musculoskeletal ROS (+)   Abdominal (+) - obese,   Peds  Hematology negative hematology ROS (+)   Anesthesia Other Findings Past Medical History: No date: Bipolar disorder (HCC) No date: CKD (chronic kidney disease), stage III (HCC) No date: Hyperlipidemia No date: PAD (peripheral artery disease) (HCC) No date: Pre-diabetes   Reproductive/Obstetrics                             Anesthesia Physical Anesthesia Plan  ASA: 3  Anesthesia Plan: General   Post-op Pain Management:    Induction: Intravenous  PONV Risk Score and Plan: 1 and Ondansetron and Dexamethasone  Airway Management Planned: Oral ETT  Additional Equipment: Arterial line  Intra-op Plan:   Post-operative Plan: Extubation in OR  Informed Consent: I have reviewed the patients History and Physical, chart, labs and  discussed the procedure including the risks, benefits and alternatives for the proposed anesthesia with the patient or authorized representative who has indicated his/her understanding and acceptance.     Dental advisory given  Plan Discussed with: CRNA and Anesthesiologist  Anesthesia Plan Comments: (EKG obtained this morning showing afib, no prior hx of afib, last EKG from 2011 showed NSR. Discussed patient with cardiologist on call, given lack of heart failure symptoms and adequate rate control would not necessarily anticoagulate and cardiovert prior to vascular procedure. Discussed with patient and his daughter the new diagnosis and need for followup with cardiology, also discussed increased risk of stroke in setting of afib. )        Anesthesia Quick Evaluation

## 2021-05-22 NOTE — Interval H&P Note (Signed)
History and Physical Interval Note:  05/22/2021 8:11 AM  Keith Levy  has presented today for surgery, with the diagnosis of ASO WITH PAIN.  The various methods of treatment have been discussed with the patient and family. After consideration of risks, benefits and other options for treatment, the patient has consented to  Procedure(s): ENDARTERECTOMY FEMORAL (N/A) INSERTION OF ILIAC STENT, BILATERAL (Bilateral) APPLICATION OF CELL SAVER (N/A) as a surgical intervention.  The patient's history has been reviewed, patient examined, no change in status, stable for surgery.  I have reviewed the patient's chart and labs.  Questions were answered to the patient's satisfaction.     [name]

## 2021-05-22 NOTE — Plan of Care (Signed)

## 2021-05-22 NOTE — Consult Note (Signed)
[facility]  CARDIOLOGY CONSULT NOTE  Patient ID: Keith Levy MRN: [id number] DOB/AGE: [birth year] 80 y.o.  Admit date: 05/22/2021 Referring Physician Amy [place] Primary Physician Rusty Aus, MD Primary Cardiologist None Reason for Consultation New onset AF  HPI:  Keith Levy is a 80 year old male with a history of PAD (undergoing femoral endarterectomy today), CKD stage III, hyperlipidemia, tobacco abuse, bipolar disorder, prediabetes for whom cardiology is consulted to evaluate atrial fibrillation noted preoperatively today.  He was seen by his primary care physician on 05/07/2021 for a follow-up visit.  At that time they discussed his weight loss and continued smoking.  He additionally was having severe pain of his right pain including at rest.  The right SFA was occluded at its origin at the location of several previously placed stents.  He presents today for a planned right femoral endarterectomy and lower extremity bypass which was successfully completed today.  At the time of my interview this evening he has no complaints and is lying in bed comfortably.  He is unaware that the surgery was completed this morning and has no pain.  He states from a cardiovascular perspective he is doing well.  Prior to surgery he denies any significant shortness of breath or chest pain.  He is significantly limited by his lower extremity pain and as result was somewhat immobile prior to surgery.  Cardiac review of systems is otherwise negative.  Review of systems complete and found to be negative unless listed above     Past Medical History:  Diagnosis Date   Bipolar disorder ([place])    CKD (chronic kidney disease), stage III ([place])    Hyperlipidemia    PAD (peripheral artery disease) ([place])    Pre-diabetes     Past Surgical History:  Procedure Laterality Date   LOWER EXTREMITY ANGIOGRAPHY Right 10/22/2020   Procedure: LOWER EXTREMITY ANGIOGRAPHY;  Surgeon: Algernon Huxley, MD;  Location:  [place] [facility];  Service: Cardiovascular;  Laterality: Right;   LOWER EXTREMITY ANGIOGRAPHY Left 11/01/2020   Procedure: LOWER EXTREMITY ANGIOGRAPHY;  Surgeon: Algernon Huxley, MD;  Location: [place] [facility];  Service: Cardiovascular;  Laterality: Left;   LOWER EXTREMITY ANGIOGRAPHY Right 12/06/2020   Procedure: LOWER EXTREMITY ANGIOGRAPHY;  Surgeon: Algernon Huxley, MD;  Location: [place] [facility];  Service: Cardiovascular;  Laterality: Right;   LOWER EXTREMITY ANGIOGRAPHY Right 05/16/2021   Procedure: LOWER EXTREMITY ANGIOGRAPHY;  Surgeon: Algernon Huxley, MD;  Location: [place] [facility];  Service: Cardiovascular;  Laterality: Right;   VASCULAR SURGERY     stents on LLE and RLE    Medications Prior to Admission  Medication Sig Dispense Refill Last Dose   aspirin 81 MG EC tablet Take 81 mg by mouth daily.   05/21/2021   clopidogrel (PLAVIX) 75 MG tablet Take 1 tablet (75 mg total) by mouth daily. 30 tablet 11 05/21/2021   furosemide (LASIX) 20 MG tablet Take 20 mg by mouth daily.   05/21/2021   lithium carbonate 300 MG capsule Take 300 mg by mouth 2 (two) times daily.   05/22/2021   PARoxetine (PAXIL) 20 MG tablet Take 20 mg by mouth daily.   05/22/2021   QUEtiapine (SEROQUEL) 200 MG tablet Take 200 mg by mouth at bedtime.   05/21/2021   QUEtiapine (SEROQUEL) 50 MG tablet Take 50 mg by mouth at bedtime.   05/21/2021   simvastatin (ZOCOR) 20 MG tablet Take 20 mg by mouth daily.   05/22/2021   Social History  Socioeconomic History   Marital status: Married    Spouse name: Not on file   Number of children: Not on file   Years of education: Not on file   Highest education level: Not on file  Occupational History   Not on file  Tobacco Use   Smoking status: Former    Packs/day: 1.00    Types: Cigarettes    Quit date: 05/12/2021    Years since quitting: 0.0   Smokeless tobacco: Never  Vaping Use   Vaping Use: Never used  Substance and Sexual Activity   Alcohol use: Yes    Alcohol/week: 8.0  standard drinks    Types: 6 Cans of beer, 2 Shots of liquor per week    Comment: 6 pack a week and couple mixed drinks/week   Drug use: Never   Sexual activity: Not on file  Other Topics Concern   Not on file  Social History Narrative   Not on file   Social Determinants of Health   Financial Resource Strain: Not on file  Food Insecurity: Not on file  Transportation Needs: Not on file  Physical Activity: Not on file  Stress: Not on file  Social Connections: Not on file  Intimate Partner Violence: Not on file    History reviewed. No pertinent family history.    Review of systems complete and found to be negative unless listed above      PHYSICAL EXAM  General: Well developed, well nourished, in no acute distress HEENT:  Normocephalic and atramatic Neck:  No JVD.  Lungs: Clear bilaterally to auscultation and percussion. Heart: Irregularly irregular. Normal S1 and S2 without gallops or murmurs.  Abdomen: Bowel sounds are positive, abdomen soft and non-tender  Msk:  Back normal, normal gait. Normal strength and tone for age. Extremities: Incision over right femoral artery without bleeding or hematoma.  No edema. Neuro: Alert and oriented X 3. Psych:  Good affect, responds appropriately  Labs:   Lab Results  Component Value Date   HGB 12.2 (L) 05/22/2021   HCT 36.0 (L) 05/22/2021    Recent Labs  Lab 05/22/21 0808  NA 135  K 3.7  CL 103  BUN 34*  CREATININE 2.00*  GLUCOSE 109*   No results found for: CKTOTAL, CKMB, CKMBINDEX, TROPONINI No results found for: CHOL No results found for: HDL No results found for: LDLCALC No results found for: TRIG No results found for: CHOLHDL No results found for: LDLDIRECT    Radiology: PERIPHERAL VASCULAR CATHETERIZATION  Result Date: 05/16/2021 See surgical note for result.  VAS [place] ABI WITH/WO TBI  Result Date: 05/21/2021  LOWER EXTREMITY DOPPLER STUDY Patient Name:  Keith Levy  Date of Exam:   05/10/2021 Medical Rec  #: [id number]         Accession #:    [id number] Date of Birth: [birth year]         Patient Gender: M Patient Age:   27 years Exam Location:  [facility] Procedure:      VAS [place] ABI WITH/WO TBI Referring Phys: Corene Cornea DEW --------------------------------------------------------------------------------  Indications: Peripheral artery disease.  Vascular Interventions: 10/22/20: Bilateral EIA & right SFA stents;                         11/01/20: Right CIA/EIA stent with right SFA/popliteal                         thrombectomy/PTA/stent;  12/06/20: Right proximal SFA to AK popliteal stents x2;. Performing Technologist: Blondell Reveal RT, RDMS, RVT  Examination Guidelines: A complete evaluation includes at minimum, Doppler waveform signals and systolic blood pressure reading at the level of bilateral brachial, anterior tibial, and posterior tibial arteries, when vessel segments are accessible. Bilateral testing is considered an integral part of a complete examination. Photoelectric Plethysmograph (PPG) waveforms and toe systolic pressure readings are included as required and additional duplex testing as needed. Limited examinations for reoccurring indications may be performed as noted.  ABI Findings: +---------+------------------+-----+------------+--------------------------+ Right    Rt Pressure (mmHg)IndexWaveform    Comment                    +---------+------------------+-----+------------+--------------------------+ Brachial 137                                                           +---------+------------------+-----+------------+--------------------------+ ATA      18                0.13 monophasic  continuous flow            +---------+------------------+-----+------------+--------------------------+ PTA                             occluded    no flow detected by duplex +---------+------------------+-----+------------+--------------------------+ PERO                             not detectedno flow detected by duplex +---------+------------------+-----+------------+--------------------------+ Great Toe                       not detected                           +---------+------------------+-----+------------+--------------------------+ +---------+------------------+-----+----------+-------+ Left     Lt Pressure (mmHg)IndexWaveform  Comment +---------+------------------+-----+----------+-------+ Brachial 142                                      +---------+------------------+-----+----------+-------+ ATA      78                0.55 monophasic        +---------+------------------+-----+----------+-------+ PTA      83                0.58 monophasic        +---------+------------------+-----+----------+-------+ Great Toe48                0.34 Abnormal          +---------+------------------+-----+----------+-------+ +-------+-----------+-----------+------------+------------+ ABI/TBIToday's ABIToday's TBIPrevious ABIPrevious TBI +-------+-----------+-----------+------------+------------+ Right  0.13       NA         0.94        0.67         +-------+-----------+-----------+------------+------------+ Left   0.58       0.34       0.97        1.01         +-------+-----------+-----------+------------+------------+ Bilateral ABIs appear decreased compared to prior study on 01/04/21.  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 Left: Resting left  ankle-brachial index indicates moderate left lower extremity arterial disease. The left toe-brachial index is abnormal.  *See table(s) above for measurements and observations.  Electronically signed by [name] MD on 05/21/2021 at 9:15:14 AM.    Final     EKG: Atrial fibrillation with normal ventricular rate.  ASSESSMENT AND PLAN:  Keith Levy is a 80 year old male with a history of PAD (undergoing femoral endarterectomy today), CKD  stage III, hyperlipidemia, tobacco abuse, bipolar disorder, prediabetes for whom cardiology is consulted to evaluate atrial fibrillation noted preoperatively today.  #Atrial fibrillation The duration of this is unclear as the patient is completely asymptomatic.  His ventricular rate is normal between 60 and 80 bpm without any rate controlling medications.  Long-term anticoagulation is reasonable based on his risk factors, but there is no immediate rush in doing this. -CHA2DS2-VASc =  3 (age 75, vasc) -Long-term anticoagulation is reasonable, though there is of no immediate rush to start this.  Would start this when there is minimal risk of bleeding in the postoperative period -Would NOT recommend long-term triple therapy with aspirin, Plavix, and DOAC; would stop aspirin when DOAC is started.  -We will obtain echocardiogram -No need for rate controlling medications at this time  Signed: Andrez Grime MD 05/22/2021, 11:29 AM

## 2021-05-22 NOTE — Progress Notes (Signed)
Unable to doppler DP pulses on left foot, Dr. Delana Meyer and Dr. Lucky Cowboy both notified and will continue to monitor patient.

## 2021-05-22 NOTE — Progress Notes (Signed)
Arrived to ICU via bed and monitor from PACU.  Awake and alert, denies pain, afib on monitor, aline to left radial with good waveform and intact.  MD notified of patients arrival.  Call bell given to patient and POC explained,.

## 2021-05-22 NOTE — Anesthesia Procedure Notes (Signed)
Procedure Name: Intubation Date/Time: 05/22/2021 8:41 AM Performed by: Emmie Niemann, MD Pre-anesthesia Checklist: Patient identified, Emergency Drugs available, Suction available and Patient being monitored Patient Re-evaluated:Patient Re-evaluated prior to induction Oxygen Delivery Method: Circle system utilized Preoxygenation: Pre-oxygenation with 100% oxygen Induction Type: IV induction Ventilation: Mask ventilation without difficulty Tube type: Oral Tube size: 7.5 mm Number of attempts: 1 Airway Equipment and Method: Stylet and Oral airway Placement Confirmation: ETT inserted through vocal cords under direct vision, positive ETCO2 and breath sounds checked- equal and bilateral Secured at: 23 cm Tube secured with: Tape Dental Injury: Teeth and Oropharynx as per pre-operative assessment

## 2021-05-22 NOTE — Transfer of Care (Signed)
Immediate Anesthesia Transfer of Care Note  Patient: Keith Levy  Procedure(s) Performed: ENDARTERECTOMY FEMORAL (Right: Groin) INSERTION OF ILIAC STENT, BILATERAL (Bilateral) APPLICATION OF CELL SAVER  Patient Location: PACU  Anesthesia Type:General  Level of Consciousness: awake  Airway & Oxygen Therapy: Patient Spontanous Breathing and Patient connected to face mask oxygen  Post-op Assessment: Report given to RN and Post -op Vital signs reviewed and stable  Post vital signs: Reviewed and stable  Last Vitals:  Vitals Value Taken Time  BP    Temp    Pulse    Resp    SpO2      Last Pain:  Vitals:   05/22/21 0750  TempSrc: Temporal         Complications: No notable events documented.

## 2021-05-23 ENCOUNTER — Encounter: Payer: Self-pay | Admitting: Vascular Surgery

## 2021-05-23 ENCOUNTER — Inpatient Hospital Stay: Payer: Medicare Other

## 2021-05-23 LAB — BASIC METABOLIC PANEL
Anion gap: 8 (ref 5–15)
BUN: 27 mg/dL — ABNORMAL HIGH (ref 8–23)
CO2: 20 mmol/L — ABNORMAL LOW (ref 22–32)
Calcium: 8.4 mg/dL — ABNORMAL LOW (ref 8.9–10.3)
Chloride: 103 mmol/L (ref 98–111)
Creatinine, Ser: 1.54 mg/dL — ABNORMAL HIGH (ref 0.61–1.24)
GFR, Estimated: 46 mL/min — ABNORMAL LOW (ref >60)
Glucose, Bld: 105 mg/dL — ABNORMAL HIGH (ref 70–99)
Potassium: 3.1 mmol/L — ABNORMAL LOW (ref 3.5–5.1)
Sodium: 131 mmol/L — ABNORMAL LOW (ref 135–145)

## 2021-05-23 LAB — CBC
HCT: 24 % — ABNORMAL LOW (ref 39.0–52.0)
Hemoglobin: 7.8 g/dL — ABNORMAL LOW (ref 13.0–17.0)
MCH: 28.8 pg (ref 26.0–34.0)
MCHC: 32.5 g/dL (ref 30.0–36.0)
MCV: 88.6 fL (ref 80.0–100.0)
Platelets: 243 10*3/uL (ref 150–400)
RBC: 2.71 MIL/uL — ABNORMAL LOW (ref 4.22–5.81)
RDW: 15.7 % — ABNORMAL HIGH (ref 11.5–15.5)
WBC: 9 10*3/uL (ref 4.0–10.5)
nRBC: 0 % (ref 0.0–0.2)

## 2021-05-23 LAB — MAGNESIUM: Magnesium: 2.1 mg/dL (ref 1.7–2.4)

## 2021-05-23 LAB — ECHOCARDIOGRAM COMPLETE
Height: 71 in
P 1/2 time: 338 msec
S' Lateral: 3.2 cm
Weight: 2634.94 oz

## 2021-05-23 LAB — SURGICAL PATHOLOGY

## 2021-05-23 IMAGING — CT CT ABD-PELV W/ CM
2 of 5 series · 12 of 46 positions shown, 14 images · IV contrast (APPLIED)
Comparison: None.
COMPARISON: None.

Addendum:
CLINICAL DATA: Atherosclerosis/peripheral vascular disease, lower
extremity vascular surgery on [DATE]. We are asked to assess for
imaging evidence of malignancy.

EXAM:
CT ABDOMEN AND PELVIS WITH CONTRAST
TECHNIQUE: Multidetector CT imaging of the abdomen and pelvis was performed
using the standard protocol following bolus administration of
intravenous contrast.
CONTRAST:  80mL OMNIPAQUE IOHEXOL 350 MG/ML SOLN

[Series 2: axial st · axial · 0.72mm/px · z∈[-1000,-575]mm · 9 of 102 slices shown, 11 images]
[im 11/102  soft-tissue]
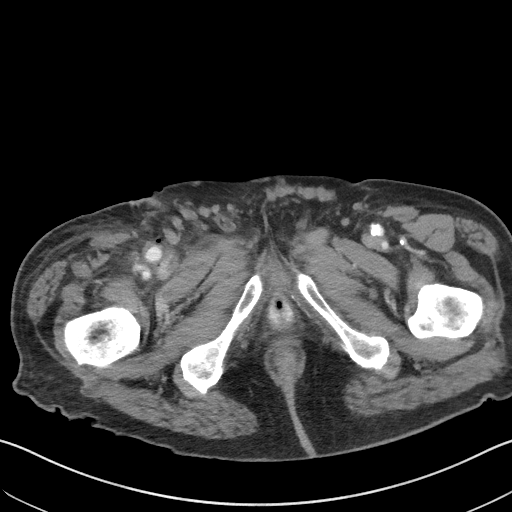
[im 11/102  bone]
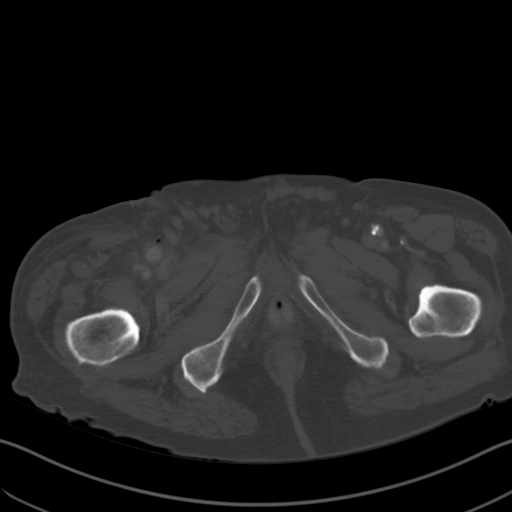
[im 22/102  soft-tissue]
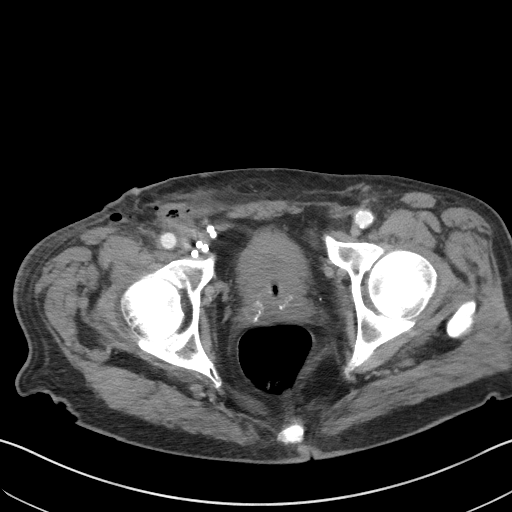
[im 32/102  soft-tissue]
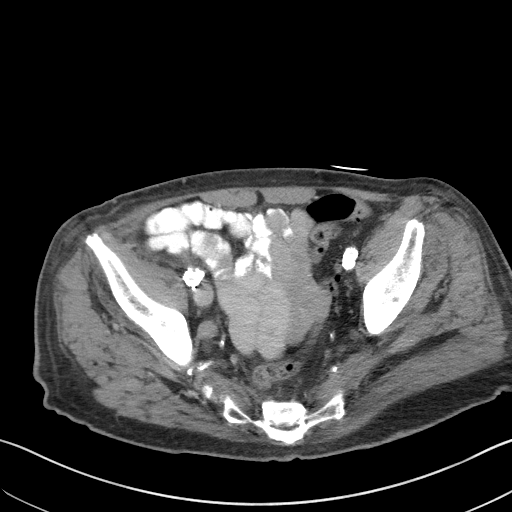
[im 43/102  soft-tissue]
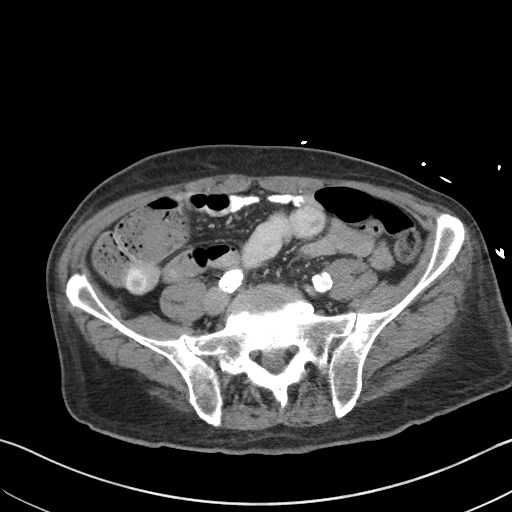
[im 54/102  soft-tissue]
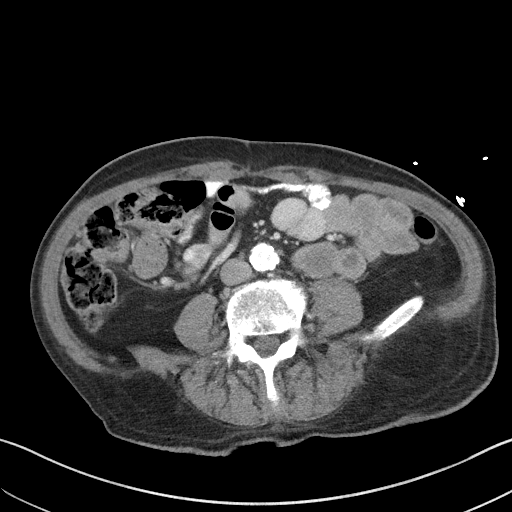
[im 64/102  soft-tissue]
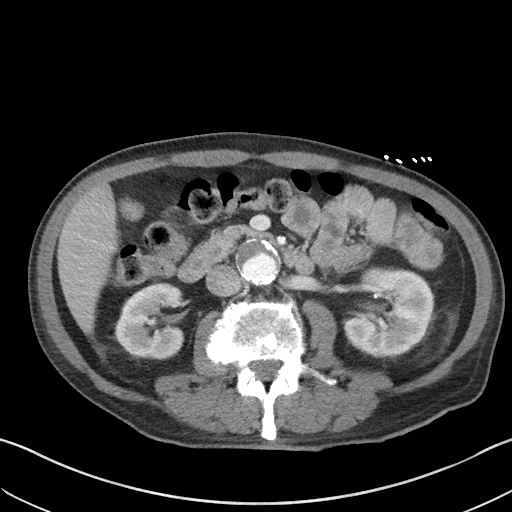
[im 75/102  soft-tissue]
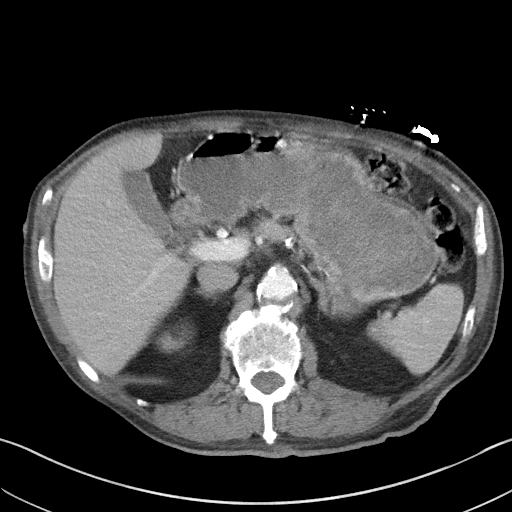
[im 86/102  soft-tissue]
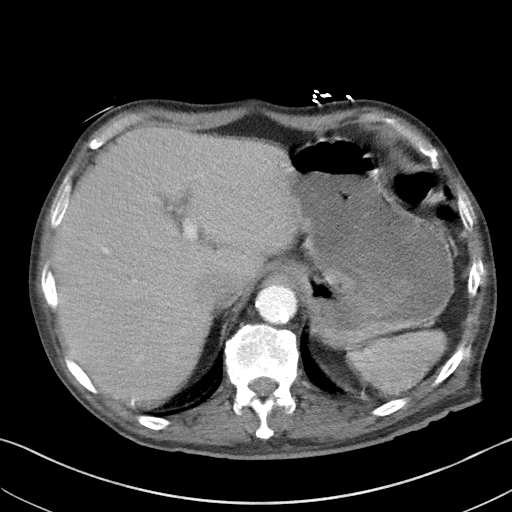
[im 96/102  soft-tissue]
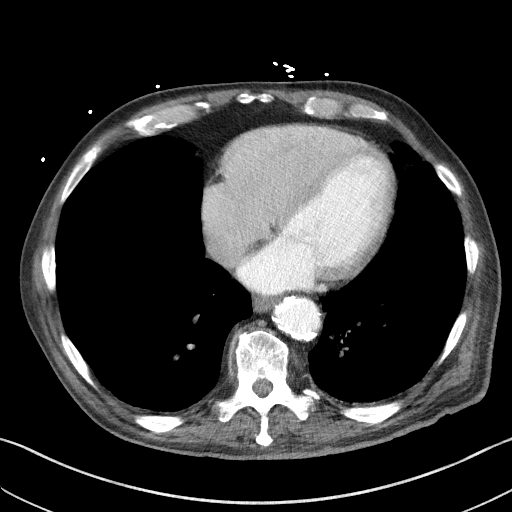
[im 96/102  bone]
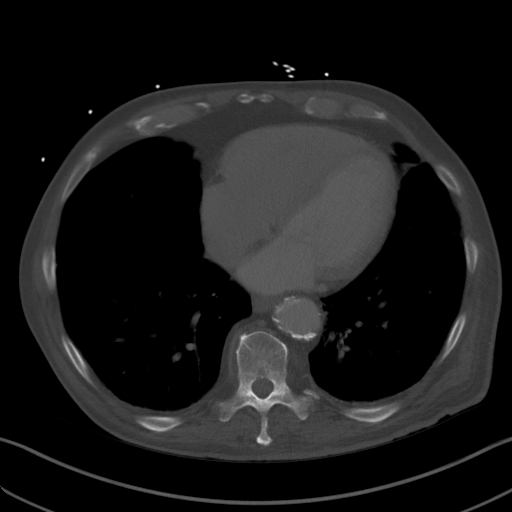

[Series 5: coronal st · coronal · 0.82mm/px · 3 of 95 slices shown]
[im 32/95  soft-tissue]
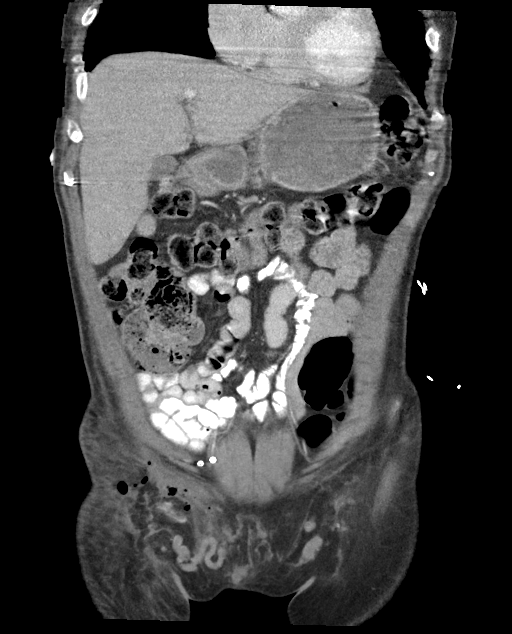
[im 42/95  soft-tissue]
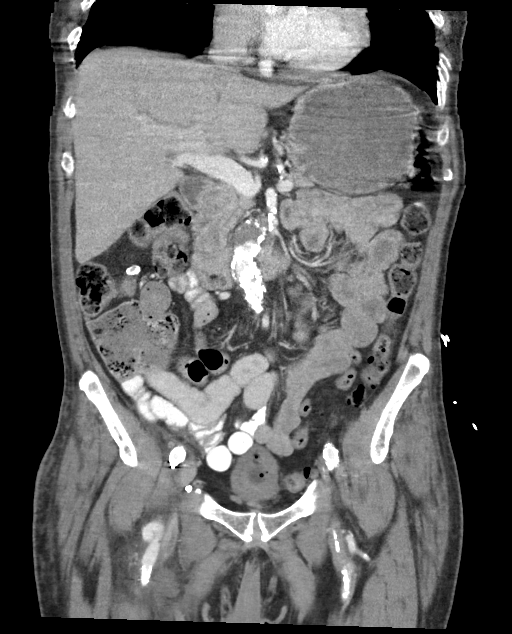
[im 53/95  soft-tissue]
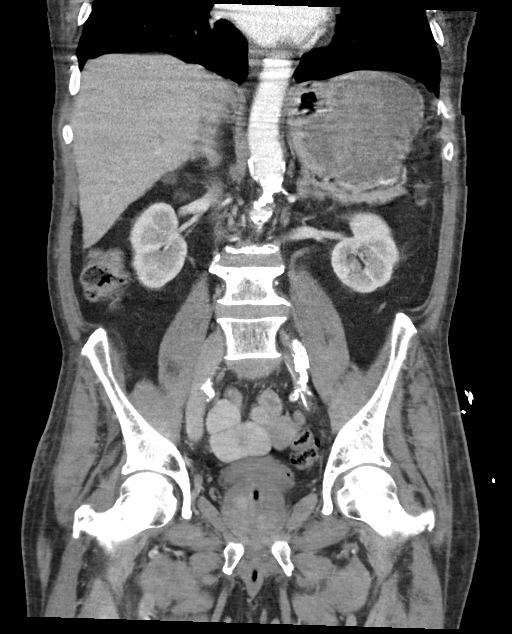

[12 of 46 positions shown; findings below may reference images not displayed]

FINDINGS: Despite efforts by the technologist and patient, motion artifact is
present on today's exam and could not be eliminated. This reduces
exam sensitivity and specificity.

Lower chest: Descending thoracic aortic, left anterior descending,
and right coronary artery atherosclerotic calcification. Mild
dependent atelectasis in the posterior basal segment left lower
lobe.

Hepatobiliary: Several gallstones are present in the gallbladder,
showing nitrogen gas phenomenon. No biliary dilatation. The liver
appears otherwise unremarkable.

Pancreas: Unremarkable

Spleen: Unremarkable

Adrenals/Urinary Tract: The adrenal glands appear normal. Exophytic
1.7 cm lesion of the right kidney upper pole anteriorly favors renal
cyst. Other small hypodense bilateral renal lesions are technically
too small to characterize. There is some equivocal hypodensity in
the parenchyma of the left kidney lower pole for example on image 26
of series 7, correlate with urine analysis to exclude the
possibility of pyelonephritis. No hydronephrosis or hydroureter.
There is a Foley catheter in the urinary bladder along with a small
amount of gas in the urinary bladder. Prominent prostate gland
indents bladder base.

Stomach/Bowel: Sigmoid colon diverticulosis without findings of
active diverticulitis.

Vascular/Lymphatic: Atherosclerosis is present, including aortoiliac
atherosclerotic disease. Substantial atheromatous plaque in the
celiac trunk, SMA, and proximal renal arteries better assessed with
CT angiography if clinically warranted. Infrarenal abdominal aortic
aneurysm measuring 3.4 cm anterior-posterior with associated mural
thrombus. Iliac artery stents bilaterally, these appear grossly
patent. However, there is filling defect in the right superficial
femoral vein for example on image 98 of series 2 compatible with
DVT. Stent in the right SFA partially imaged. There is gas along the
soft tissues of the right groin, not unexpected on postoperative day
1. There is some collateral venous structures in the subcutaneous
tissues tracking anterior to the pubis.

Reproductive: Moderate prostatomegaly.

Other: No supplemental non-categorized findings.

Musculoskeletal: Lumbar spondylosis and degenerative disc disease
contributing to bilateral foraminal impingement at L3-4 and L4-5.
IMPRESSION: 1. High suspicion for DVT in the right proximal superficial femoral
vein. Consider sonographic correlation.
2. Postoperative findings in the right groin, with gas in the soft
tissues not unexpected on postoperative day 1.
3. Extensive atherosclerosis.  Cholelithiasis.
4. Subtle hypoenhancement in the left kidney lower pole parenchyma,
correlate with urine analysis to exclude pyelonephritis.
5. Prostatomegaly.
6. Sigmoid colon diverticulosis.
7. Lumbar spondylosis and degenerative disc disease contributing to
foraminal impingement at L3-4 and L4-5.

Radiology assistant personnel have been notified to put me in
telephone contact with the referring physician or the referring
physician's clinical representative in order to discuss these
findings. Once this communication is established I will issue an
addendum to this report for documentation purposes.

ADDENDUM:
The original report was by Dr. EVENCIO. The following
addendum is by Dr. EVENCIO:

Critical Value/emergent results were called by telephone at the time
of interpretation on [DATE] at [DATE] to provider EVENCIO , who
verbally acknowledged these results.

*** End of Addendum ***
FINDINGS: Despite efforts by the technologist and patient, motion artifact is
present on today's exam and could not be eliminated. This reduces
exam sensitivity and specificity.

Lower chest: Descending thoracic aortic, left anterior descending,
and right coronary artery atherosclerotic calcification. Mild
dependent atelectasis in the posterior basal segment left lower
lobe.

Hepatobiliary: Several gallstones are present in the gallbladder,
showing nitrogen gas phenomenon. No biliary dilatation. The liver
appears otherwise unremarkable.

Pancreas: Unremarkable

Spleen: Unremarkable

Adrenals/Urinary Tract: The adrenal glands appear normal. Exophytic
1.7 cm lesion of the right kidney upper pole anteriorly favors renal
cyst. Other small hypodense bilateral renal lesions are technically
too small to characterize. There is some equivocal hypodensity in
the parenchyma of the left kidney lower pole for example on image 26
of series 7, correlate with urine analysis to exclude the
possibility of pyelonephritis. No hydronephrosis or hydroureter.
There is a Foley catheter in the urinary bladder along with a small
amount of gas in the urinary bladder. Prominent prostate gland
indents bladder base.

Stomach/Bowel: Sigmoid colon diverticulosis without findings of
active diverticulitis.

Vascular/Lymphatic: Atherosclerosis is present, including aortoiliac
atherosclerotic disease. Substantial atheromatous plaque in the
celiac trunk, SMA, and proximal renal arteries better assessed with
CT angiography if clinically warranted. Infrarenal abdominal aortic
aneurysm measuring 3.4 cm anterior-posterior with associated mural
thrombus. Iliac artery stents bilaterally, these appear grossly
patent. However, there is filling defect in the right superficial
femoral vein for example on image 98 of series 2 compatible with
DVT. Stent in the right SFA partially imaged. There is gas along the
soft tissues of the right groin, not unexpected on postoperative day
1. There is some collateral venous structures in the subcutaneous
tissues tracking anterior to the pubis.

Reproductive: Moderate prostatomegaly.

Other: No supplemental non-categorized findings.

Musculoskeletal: Lumbar spondylosis and degenerative disc disease
contributing to bilateral foraminal impingement at L3-4 and L4-5.
IMPRESSION: 1. High suspicion for DVT in the right proximal superficial femoral
vein. Consider sonographic correlation.
2. Postoperative findings in the right groin, with gas in the soft
tissues not unexpected on postoperative day 1.
3. Extensive atherosclerosis.  Cholelithiasis.
4. Subtle hypoenhancement in the left kidney lower pole parenchyma,
correlate with urine analysis to exclude pyelonephritis.
5. Prostatomegaly.
6. Sigmoid colon diverticulosis.
7. Lumbar spondylosis and degenerative disc disease contributing to
foraminal impingement at L3-4 and L4-5.

Radiology assistant personnel have been notified to put me in
telephone contact with the referring physician or the referring
physician's clinical representative in order to discuss these
findings. Once this communication is established I will issue an
addendum to this report for documentation purposes.

## 2021-05-23 MED ORDER — IOHEXOL 240 MG/ML SOLN
25.0000 mL | INTRAMUSCULAR | Status: AC
  Administered 2021-05-23: 25 mL via ORAL

## 2021-05-23 MED ORDER — POTASSIUM CHLORIDE 10 MEQ/100ML IV SOLN
10.0000 meq | INTRAVENOUS | Status: AC
  Administered 2021-05-23 (×2): 10 meq via INTRAVENOUS
  Filled 2021-05-23 (×2): qty 100

## 2021-05-23 MED ORDER — ATROPINE SULFATE 1 MG/10ML IJ SOSY
PREFILLED_SYRINGE | INTRAMUSCULAR | Status: AC
  Filled 2021-05-23: qty 10

## 2021-05-23 MED ORDER — ENSURE ENLIVE PO LIQD
237.0000 mL | Freq: Three times a day (TID) | ORAL | Status: DC
  Administered 2021-05-23 – 2021-05-29 (×16): 237 mL via ORAL

## 2021-05-23 MED ORDER — IOHEXOL 350 MG/ML SOLN
80.0000 mL | Freq: Once | INTRAVENOUS | Status: AC | PRN
  Administered 2021-05-23: 80 mL via INTRAVENOUS

## 2021-05-23 MED ORDER — IOHEXOL 350 MG/ML SOLN: 80.0000 mL | Freq: Once | INTRAVENOUS | Status: DC | PRN

## 2021-05-23 MED ORDER — POTASSIUM CHLORIDE 10 MEQ/100ML IV SOLN
10.0000 meq | INTRAVENOUS | Status: AC
  Administered 2021-05-23: 10 meq via INTRAVENOUS
  Filled 2021-05-23 (×2): qty 100

## 2021-05-23 MED ORDER — ADULT MULTIVITAMIN W/MINERALS CH
1.0000 | ORAL_TABLET | Freq: Every day | ORAL | Status: DC
  Administered 2021-05-24 – 2021-05-29 (×6): 1 via ORAL
  Filled 2021-05-23 (×6): qty 1

## 2021-05-23 NOTE — Progress Notes (Signed)
PT Cancellation Note  Patient Details Name: Keith Levy MRN: [id number] DOB: 14-Jul-1941   Cancelled Treatment:    Reason Eval/Treat Not Completed: Patient at procedure or test/unavailable.  PT consult received.  Chart reviewed.  Transport just arrived and took pt off unit for imaging (pt not available for PT evaluation).  Will re-attempt PT evaluation at a later date/time.  Leitha Bleak, PT 05/23/21, 3:16 PM

## 2021-05-23 NOTE — Progress Notes (Signed)
[facility]  Daily Progress Note   Subjective  -   Doing well. Pain 1/10 currently. No complaints.  Awaiting CT scan to evaluate for malignancy.  Objective Vitals:   05/23/21 1200 05/23/21 1300 05/23/21 1400 05/23/21 1500  BP: 130/88 104/78  116/66  Pulse: 86 88 (!) 107 90  Resp: '17 16 18 20  '$ Temp: 98.4 F (36.9 C)     TempSrc: Oral     SpO2: 96% 98% 92% 90%  Weight:      Height:        Intake/Output Summary (Last 24 hours) at 05/23/2021 1618 Last data filed at 05/23/2021 1304 Gross per 24 hour  Intake 2254.85 ml  Output 1010 ml  Net 1244.85 ml    PULM  CTAB CV  irregular VASC  Right foot warm, 1+ pedal pulses. Mild swelling  Laboratory CBC    Component Value Date/Time   WBC 9.0 05/23/2021 0355   HGB 7.8 (L) 05/23/2021 0355   HCT 24.0 (L) 05/23/2021 0355   PLT 243 05/23/2021 0355    BMET    Component Value Date/Time   NA 131 (L) 05/23/2021 0355   K 3.1 (L) 05/23/2021 0355   CL 103 05/23/2021 0355   CO2 20 (L) 05/23/2021 0355   GLUCOSE 105 (H) 05/23/2021 0355   BUN 27 (H) 05/23/2021 0355   CREATININE 1.54 (H) 05/23/2021 0355   CALCIUM 8.4 (L) 05/23/2021 0355   GFRNONAA 46 (L) 05/23/2021 0355    Assessment/Planning: POD #1 s/p right femoral endarterectomy, right iliac thrombectomy, bilateral iliac stents, right SFA an popliteal stents  Doing well. Can go to the floor. May start Xarelto or Eliquis tomorrow if no signs of bleeding Care management consult for placement   [name]  05/23/2021, 4:18 PM

## 2021-05-23 NOTE — Progress Notes (Signed)
Patient HGB noted to have dropped since vascular procedure, 10.4 to 7.8. Potassium noted to be 3.1. Dr. Lucky Cowboy made aware.

## 2021-05-23 NOTE — Evaluation (Signed)
Occupational Therapy Evaluation Patient Details Name: Keith Levy MRN: [id number] DOB: [birth year] Today's Date: 05/23/2021    History of Present Illness 80 year old male with a history of PAD s/p femoral endarterectomy and b/l iliac stents 9/7, CKD stage III, hyperlipidemia, tobacco abuse, bipolar disorder, prediabetes and atrial fibrillation.   Clinical Impression   Patient presenting with decreased Ind in self care,balance, functional mobility/transfers, endurance, and safety awareness. Patient reports living at home independently with use of SPC at baseline. Pt reports his daughter intermittently checking on him and recently brought him a shower chair for safety. Pt still drives and enjoys working on "old cars in the garage".  Patient currently needing min A to EOB. He reports need for toileting for BM. Mod A stand pivot to [facility]. Pt unable to void. He does report dizziness and then begins vomiting repeatedly. RN assists therapist in returning pt back to bed. Session limited secondary to pt feeling ill at this time. Mod A to return to supine. Pt also answering orientation questions incorrectly other than his own name. Patient will benefit from acute OT to increase overall independence in the areas of ADLs, functional mobility, safety awareness in order to safely discharge to next venue of care.     Follow Up Recommendations  SNF;Supervision - Intermittent    Equipment Recommendations  Other (comment) (defer to next venue of care)       Precautions / Restrictions Precautions Precautions: Fall Precaution Comments: L radial A-line      Mobility Bed Mobility Overal bed mobility: Needs Assistance Bed Mobility: Supine to Sit;Sit to Supine     Supine to sit: Min assist;HOB elevated Sit to supine: Mod assist;HOB elevated   General bed mobility comments: min cuing for technique and hand placement    Transfers Overall transfer level: Needs assistance Equipment used: 1 person hand  held assist Transfers: Sit to/from [facility] Sit to Stand: Min assist Stand pivot transfers: Mod assist            Balance Overall balance assessment: Needs assistance Sitting-balance support: Feet supported Sitting balance-Leahy Scale: Good     Standing balance support: During functional activity;Single extremity supported Standing balance-Leahy Scale: Fair                             ADL either performed or assessed with clinical judgement   ADL Overall ADL's : Needs assistance/impaired                 Upper Body Dressing : Minimal assistance;Sitting       Toilet Transfer: Moderate assistance;BSC   Toileting- Clothing Manipulation and Hygiene: Moderate assistance;Sit to/from stand       Functional mobility during ADLs: Moderate assistance       Vision Patient Visual Report: No change from baseline              Pertinent Vitals/Pain Pain Assessment: No/denies pain     Hand Dominance Right   Extremity/Trunk Assessment Upper Extremity Assessment Upper Extremity Assessment: Generalized weakness (L radial A line placed)   Lower Extremity Assessment Lower Extremity Assessment: Generalized weakness       Communication Communication Communication: No difficulties   Cognition Arousal/Alertness: Awake/alert Behavior During Therapy: WFL for tasks assessed/performed Overall Cognitive Status: No family/caregiver present to determine baseline cognitive functioning  General Comments: Pt oriented to self only and when asked about location and time he reports, "nov", "2021", and [place]"              Home Living Family/patient expects to be discharged to:: Private residence Living Arrangements: Alone Available Help at Discharge: Family;Available PRN/intermittently Type of Home: House Home Access: Stairs to enter [employer] of Steps: 2- from garage Entrance  Stairs-Rails: None Home Layout: Multi-level;Able to live on main level with bedroom/bathroom (Pt reports 2 levels and basement)     Bathroom Shower/Tub: Walk-in shower         Home Equipment: Kasandra Knudsen - single point;Shower seat          Prior Functioning/Environment Level of Independence: Independent with assistive device(s)        Comments: use of SPC for mobility and recent use of shower chair for safety with bathing        OT Problem List: Decreased strength;Decreased cognition;Decreased activity tolerance;Decreased safety awareness;Impaired balance (sitting and/or standing);Decreased knowledge of use of DME or AE      OT Treatment/Interventions: Self-care/ADL training;Therapeutic exercise;Therapeutic activities;Energy conservation;DME and/or AE instruction;Balance training;Modalities;Patient/family education    OT Goals(Current goals can be found in the care plan section) Acute Rehab OT Goals Patient Stated Goal: to go home OT Goal Formulation: With patient Time For Goal Achievement: 06/06/21 Potential to Achieve Goals: Fair ADL Goals Pt Will Perform Grooming: with supervision Pt Will Perform Lower Body Dressing: with supervision Pt Will Transfer to Toilet: with supervision Pt Will Perform Toileting - Clothing Manipulation and hygiene: with supervision  OT Frequency: Min 2X/week   Barriers to D/C: Decreased caregiver support  Pt lives alone. He has a daughter not too far from him but she works per pt report.          AM-PAC OT "6 Clicks" Daily Activity     Outcome Measure Help from another person eating meals?: None Help from another person taking care of personal grooming?: A Little Help from another person toileting, which includes using toliet, bedpan, or urinal?: A Lot Help from another person bathing (including washing, rinsing, drying)?: A Lot Help from another person to put on and taking off regular upper body clothing?: A Little Help from another person  to put on and taking off regular lower body clothing?: A Lot 6 Click Score: 16   End of Session Equipment Utilized During Treatment: Other (comment) (BSC) Nurse Communication: Mobility status;Precautions  Activity Tolerance: Other (comment) (vomiting with mobility) Patient left: in bed;with call bell/phone within reach;with bed alarm set;with nursing/sitter in room  OT Visit Diagnosis: Unsteadiness on feet (R26.81);Muscle weakness (generalized) (M62.81)                Time: 0935-1010 OT Time Calculation (min): 35 min Charges:  OT General Charges $OT Visit: 1 Visit OT Evaluation $OT Eval Moderate Complexity: 1 Mod OT Treatments $Self Care/Home Management : 23-37 mins  Darleen Crocker, MS, OTR/L , CBIS ascom [phone]  05/23/21, 1:16 PM

## 2021-05-23 NOTE — Progress Notes (Signed)
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  GOAL:   Patient will meet greater than or equal to 90% of their needs  MONITOR:   PO intake, Supplement acceptance, Labs, Weight trends, Skin, I & O's  REASON FOR ASSESSMENT:   Malnutrition Screening Tool    ASSESSMENT:   80 year old male with a history of PAD s/p femoral endarterectomy and b/l iliac stents 9/7, CKD stage III, hyperlipidemia, tobacco abuse, bipolar disorder, prediabetes and atrial fibrillation.  Met with pt in room today. Pt reports good appetite and oral intake at baseline. Pt reports that he is eating 100% of meals at home and in hospital. Per chart, pt is down 57lbs(26%) over the past 6 months; this would be considered significant weight loss. Pt reports that weight loss was intentional as he has been trying to get healthy. Pt reports that he has recently purchased an elliptical and that he has been exercising for hours while he watches TV. Pt reports that his UBW is ~210-220lbs. RD discussed with pt the importance of adequate nutrition needed to preserve lean muscle when he is working out. Pt has drank chocolate Boost before at home and would like to have the chocolate Ensure in hospital. RD will add supplements and MVI to help pt meet his estimated needs.   Medications reviewed and include: aspirin, colace, pepcid, lovenox, lithium, paxil  Labs reviewed: Na 131(L), K 3.1(L), BUN 27(H), creat 1.54(H), Mg 2.1 wnl Hgb 7.8(L),  Hct 24.0(L)  NUTRITION - FOCUSED PHYSICAL EXAM:  Flowsheet Row Most Recent Value  Orbital Region Mild depletion  Upper Arm Region No depletion  Thoracic and Lumbar Region Mild depletion  Buccal Region Mild depletion  Temple Region Moderate depletion  Clavicle Bone Region Mild depletion  Clavicle and Acromion Bone Region Mild depletion  Scapular Bone Region Mild depletion  Dorsal Hand Moderate depletion  Patellar Region Moderate depletion  Anterior Thigh Region No depletion  Posterior Calf Region No depletion  Edema (RD Assessment) None  Hair Reviewed  Eyes Reviewed  Mouth Reviewed  Skin Reviewed  Nails Reviewed   Diet Order:   Diet Order             Diet regular Room service appropriate? Yes; Fluid consistency: Thin  Diet effective now                  EDUCATION NEEDS:   Education needs have been addressed  Skin:  Skin Assessment: Reviewed RN Assessment (incision groin)  Last BM:  pta  Height:   Ht Readings from Last 1 Encounters:  05/22/21 _0  (1.803 m)    Weight:   Wt Readings from Last 1 Encounters:  05/22/21 74.7 kg    Ideal Body Weight:  78.18 kg  BMI:  Body mass index is 22.97 kg/m.  Estimated Nutritional Needs:   Kcal:  2000-2300kcal/day  Protein:  100-115g/day  Fluid:  2.0-2.3L/day  Koleen Distance MS, RD, LDN Please refer to [facility] for RD and/or RD on-call/weekend/after hours pager

## 2021-05-23 NOTE — TOC Initial Note (Signed)
Transition of Care [facility]) - Initial/Assessment Note    Patient Details  Name: Keith Levy MRN: [id number] Date of Birth: 21-Jul-1941  Transition of Care [facility]) CM/SW Contact:    Kerin Salen, RN Phone Number: 05/23/2021, 2:49 PM  Clinical Narrative:  Spoke with patients daughter, Erling Conte, who says she is HCPOA. [name] says patient lives alone, depressed due to death of wife and relatives. Patient drives and able to cook, however [name] would do shopping. Patient with weight loss, used ETOH and cigarettes heavily. [name] suggesting patient have Rehab. Before discharging to home. Preference only location can be close to [facility]. Sharolyn Douglas says we can also call her sister, Virl Diamond [phone].                  Expected Discharge Plan: [place] Barriers to Discharge: Continued Medical Work up   Patient Goals and CMS Choice Patient states their goals for this hospitalization and ongoing recovery are:: Spoke with Daughter Erling Conte [phone]      Expected Discharge Plan and Services Expected Discharge Plan: [place] arrangements for the past 2 months: Single Family Home                                      Prior Living Arrangements/Services Living arrangements for the past 2 months: Single Family Home Lives with:: Self Patient language and need for interpreter reviewed:: No (Patient confused, spoke with daughter.)        Need for Family Participation in Patient Care: Yes (Comment) Care giver support system in place?: Yes (comment)      Activities of Daily Living Home Assistive Devices/Equipment: Cane (specify quad or straight), Eyeglasses ADL Screening (condition at time of admission) Patient's cognitive ability adequate to safely complete daily activities?: No Is the patient deaf or have difficulty hearing?: No Does the patient have difficulty seeing, even when wearing glasses/contacts?: No Does the  patient have difficulty concentrating, remembering, or making decisions?: Yes Patient able to express need for assistance with ADLs?: Yes Does the patient have difficulty dressing or bathing?: Yes Independently performs ADLs?: Yes (appropriate for developmental age) Does the patient have difficulty walking or climbing stairs?: Yes Weakness of Legs: Both Weakness of Arms/Hands: None  Permission Sought/Granted                  Emotional Assessment   Attitude/Demeanor/Rapport: Unable to Assess Affect (typically observed): Unable to Assess   Alcohol / Substance Use: Not Applicable Psych Involvement: No (comment)  Admission diagnosis:  Atherosclerosis of artery of extremity with rest pain [facility]) [I70.229] Patient Active Problem List   Diagnosis Date Noted   Atherosclerosis of artery of extremity with rest pain ([place]) 05/22/2021   Tobacco use disorder 05/10/2021   Chronic alcohol abuse 11/13/2020   Atherosclerosis of native arteries of extremity with intermittent claudication ([place]) 10/16/2020   CKD (chronic kidney disease) stage 3, GFR 30-59 ml/min ([place]) 04/27/2019   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 ([place]) 04/27/2019   Centrilobular emphysema ([place]) 04/14/2018   Medicare annual wellness visit, initial 04/07/2017   Mild major depression ([place]) 03/13/2017   Bipolar 1 disorder ([place]) 10/06/2016   Tubular adenoma 10/06/2016   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

## 2021-05-23 NOTE — Progress Notes (Signed)
Patient was sleeping post '250mg'$  Seroquel and HR dropped to 25bpm, still Afib. Unknown if patient has OSA history, no apnea recorded. Patient asymptomatic. BP 91/79, MAP 85 cuff 109/65, MAP 82 arterial line. Dr. Lucky Cowboy made aware. Will continue to monitor.

## 2021-05-23 NOTE — Anesthesia Postprocedure Evaluation (Signed)
Anesthesia Post Note  Patient: Investment banker, operational  Procedure(s) Performed: RIGHT ENDARTERECTOMY FEMORAL; INSERTION OF SFA AND POPLITEAL STENTS, THROMBECTOMY ILIAC (Right: Groin) INSERTION OF ILIAC STENT, BILATERAL (Bilateral) APPLICATION OF CELL SAVER  Patient location during evaluation: SICU Anesthesia Type: General Level of consciousness: awake and alert Pain management: pain level controlled Vital Signs Assessment: post-procedure vital signs reviewed and stable Respiratory status: spontaneous breathing and respiratory function stable Cardiovascular status: stable Postop Assessment: no apparent nausea or vomiting Anesthetic complications: no   No notable events documented.   Last Vitals:  Vitals:   05/23/21 0349 05/23/21 0400  BP:  116/69  Pulse:  73  Resp:  17  Temp: (!) 36.4 C   SpO2:  99%    Last Pain:  Vitals:   05/23/21 0349  TempSrc: Axillary  PainSc:                  Brantley Fling

## 2021-05-23 NOTE — Progress Notes (Signed)
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   Goal of Therapy:  Electrolytes WNL  Plan:  --Potassium 10 mEq IV x 4 runs --Recheck electrolytes with morning labs  Tawnya Crook, PharmD, BCPS Clinical Pharmacist 05/23/2021 1:25 PM

## 2021-05-24 ENCOUNTER — Encounter: Payer: Self-pay | Admitting: Vascular Surgery

## 2021-05-24 ENCOUNTER — Telehealth (INDEPENDENT_AMBULATORY_CARE_PROVIDER_SITE_OTHER): Payer: Self-pay | Admitting: Vascular Surgery

## 2021-05-24 DIAGNOSIS — E440 Moderate protein-calorie malnutrition: Secondary | ICD-10-CM | POA: Insufficient documentation

## 2021-05-24 LAB — BASIC METABOLIC PANEL
Anion gap: 4 — ABNORMAL LOW (ref 5–15)
BUN: 27 mg/dL — ABNORMAL HIGH (ref 8–23)
CO2: 26 mmol/L (ref 22–32)
Calcium: 9.2 mg/dL (ref 8.9–10.3)
Chloride: 101 mmol/L (ref 98–111)
Creatinine, Ser: 1.68 mg/dL — ABNORMAL HIGH (ref 0.61–1.24)
GFR, Estimated: 41 mL/min — ABNORMAL LOW (ref >60)
Glucose, Bld: 122 mg/dL — ABNORMAL HIGH (ref 70–99)
Potassium: 3.7 mmol/L (ref 3.5–5.1)
Sodium: 131 mmol/L — ABNORMAL LOW (ref 135–145)

## 2021-05-24 LAB — PHOSPHORUS: Phosphorus: 2.1 mg/dL — ABNORMAL LOW (ref 2.5–4.6)

## 2021-05-24 MED ORDER — ADULT MULTIVITAMIN W/MINERALS CH: 1.0000 | ORAL_TABLET | Freq: Every day | ORAL | Status: DC

## 2021-05-24 MED ORDER — THIAMINE HCL 100 MG PO TABS
100.0000 mg | ORAL_TABLET | Freq: Every day | ORAL | Status: DC
  Administered 2021-05-24 – 2021-05-29 (×6): 100 mg via ORAL
  Filled 2021-05-24 (×6): qty 1

## 2021-05-24 MED ORDER — SPIRITUS FRUMENTI
1.0000 | Freq: Three times a day (TID) | ORAL | Status: DC
  Administered 2021-05-25 – 2021-05-28 (×7): 1 via ORAL
  Filled 2021-05-24 (×19): qty 1

## 2021-05-24 MED ORDER — APIXABAN 5 MG PO TABS
5.0000 mg | ORAL_TABLET | Freq: Two times a day (BID) | ORAL | Status: DC
  Administered 2021-05-24 – 2021-05-29 (×10): 5 mg via ORAL
  Filled 2021-05-24 (×10): qty 1

## 2021-05-24 MED ORDER — LORAZEPAM 1 MG PO TABS
1.0000 mg | ORAL_TABLET | ORAL | Status: AC | PRN
  Administered 2021-05-26: 2 mg via ORAL
  Administered 2021-05-26: 1 mg via ORAL
  Administered 2021-05-26: 2 mg via ORAL
  Filled 2021-05-24: qty 1
  Filled 2021-05-24 (×2): qty 2

## 2021-05-24 MED ORDER — FOLIC ACID 1 MG PO TABS
1.0000 mg | ORAL_TABLET | Freq: Every day | ORAL | Status: DC
  Administered 2021-05-24 – 2021-05-29 (×6): 1 mg via ORAL
  Filled 2021-05-24 (×6): qty 1

## 2021-05-24 MED ORDER — THIAMINE HCL 100 MG/ML IJ SOLN
100.0000 mg | Freq: Every day | INTRAMUSCULAR | Status: DC
  Filled 2021-05-24: qty 2

## 2021-05-24 MED ORDER — LORAZEPAM 2 MG/ML IJ SOLN
1.0000 mg | INTRAMUSCULAR | Status: AC | PRN
  Administered 2021-05-24: 2 mg via INTRAVENOUS
  Filled 2021-05-24: qty 1

## 2021-05-24 NOTE — Progress Notes (Signed)
Pt transferred to 216 via bed and monitor.  All belongings with patinte during transfer.  Report given to [place], Therapist, sports.  POC continued.

## 2021-05-24 NOTE — Progress Notes (Signed)
Patient is confused, has pulled condom cath off as well as mitts. Has pulled out one IV and refuses to keep cardiac monitor on at this time. Patient has also been trying to get up out of bed, very weak when standing. Patient is very defiant and states "you don't tell me what to do". Patient given activity mat for distraction. MD made aware. Bed alarm on, bed in low position, call bell within reach, fall mats on floor.  Will continue to monitor.

## 2021-05-24 NOTE — Progress Notes (Signed)
Pt has become increasingly confused, with periods of agitation overnight. Pt climbing out  bed, have redirected multiple times. Condom cath has been replaced multiple times and patient has had incontinent episodes. Vitals have been stable. Pulses in lower extremities able to be found by doppler.. Redirected and reoriented multiple times. Pt remains confused

## 2021-05-24 NOTE — Progress Notes (Signed)
Call from central telemetry all leads off. Patient in bed with telemetry and pulse ox removed, refuses to have leads replaced. CCMD notified.

## 2021-05-24 NOTE — Discharge Instructions (Addendum)
Vascular Surgery Discharge Instructions: 1) You may shower.  Gently clean your groins with soap and water.  Gently pat dry. 2) Please do not engage in strenuous activity or lifting greater than 10 pounds until you are cleared at 1 your first postoperative follow-ups. 3) No driving or drinking while taking pain medication.  Information on my medicine - ELIQUIS (apixaban)  WHY WAS ELIQUIS PRESCRIBED FOR YOU? Eliquis was prescribed for you to reduce the risk of a blood clot forming that can cause a stroke if you have a medical condition called atrial fibrillation (a type of irregular heartbeat).  WHAT DO YOU NEED TO KNOW ABOUT ELIQUIS ? Take your Eliquis TWICE DAILY - one tablet in the morning and one tablet in the evening with or without food. If you have difficulty swallowing the tablet whole please discuss with your pharmacist how to take the medication safely. Take Eliquis exactly as prescribed by your doctor and DO NOT stop taking Eliquis without talking to the doctor who prescribed the medication. Stopping may increase your risk of developing a stroke. Refill your prescription before you run out. After discharge, you should have regular check-up appointments with your healthcare provider that is prescribing your Eliquis. In the future your dose may need to be changed if your kidney function or weight changes by a significant amount or as you get older. WHAT DO YOU DO IF YOU MISS A DOSE? If you miss a dose, take it as soon as you remember on the same day and resume taking twice daily. Do not take more than one dose of ELIQUIS at the same time to make up a missed dose. IMPORTANT SAFETY INFORMATION A possible side effect of Eliquis is bleeding. You should call your healthcare provider right away if you experience any of the following: ? Bleeding from an injury or your nose that does not stop. ? Unusual colored urine (red or dark brown) or unusual colored stools (red or black). ? Unusual  bruising for unknown reasons. ? A serious fall or if you hit your head (even if there is no bleeding). Some medicines may interact with Eliquis and might increase your risk of bleeding or clotting while on Eliquis. To help avoid this, consult your healthcare provider or pharmacist prior to using any new prescription or non-prescription medications, including herbals, vitamins, non-steroidal anti-inflammatory drugs (NSAIDs) and supplements.  This website has more information on Eliquis (apixaban): [email or web address].

## 2021-05-24 NOTE — Progress Notes (Signed)
[facility]  Daily Progress Note   Subjective  -   More agitated today.  Requiring a sitter.  Hemodynamics are stable.  CT scan results reviewed and discussed with the daughter  Objective Vitals:   05/24/21 0800 05/24/21 0830 05/24/21 0900 05/24/21 1528  BP: 109/65  (!) 112/56 (!) 124/57  Pulse: 85 83 64 75  Resp: 14 (!) 23 (!) 24 18  Temp:    98.1 F (36.7 C)  TempSrc:    Oral  SpO2: 100% 95% 98% 97%  Weight:      Height:        Intake/Output Summary (Last 24 hours) at 05/24/2021 1626 Last data filed at 05/24/2021 0900 Gross per 24 hour  Intake 720 ml  Output 625 ml  Net 95 ml    PULM  CTAB CV  RRR VASC  Right foot warm, mildly swollen, 1+ pulses  Laboratory CBC    Component Value Date/Time   WBC 9.0 05/23/2021 0355   HGB 7.8 (L) 05/23/2021 0355   HCT 24.0 (L) 05/23/2021 0355   PLT 243 05/23/2021 0355    BMET    Component Value Date/Time   NA 131 (L) 05/24/2021 0453   K 3.7 05/24/2021 0453   CL 101 05/24/2021 0453   CO2 26 05/24/2021 0453   GLUCOSE 122 (H) 05/24/2021 0453   BUN 27 (H) 05/24/2021 0453   CREATININE 1.68 (H) 05/24/2021 0453   CALCIUM 9.2 05/24/2021 0453   GFRNONAA 41 (L) 05/24/2021 0453    Assessment/Planning: POD #2 s/p right femoral endarterectomy, right SFA and iliac intervention and left iliac intervention  Seems to be having agitation likely from alcohol withdrawal Discussed with daughter and we will offer him alcohol with meals as well as CIWA protocol PT and OT Likely here through the weekend and will need rehab at discharge I suspect  [name]  05/24/2021, 4:26 PM

## 2021-05-24 NOTE — Progress Notes (Signed)
PT Cancellation Note  Patient Details Name: Keith Levy MRN: [id number] DOB: Jan 01, 1941   Cancelled Treatment:    Reason Eval/Treat Not Completed: Other (comment).  Chart reviewed.  CT abdomen pelvis showing "High suspicion for DVT in the right proximal superficial femoral Vein".  Per MD Dew plan to start anti-coagulation today anyway.  D/t this, will hold therapy at this time and re-attempt PT evaluation at a later date/time.  Leitha Bleak, PT 05/24/21, 2:55 PM

## 2021-05-24 NOTE — Progress Notes (Signed)
[facility] Cardiology  CARDIOLOGY CONSULT NOTE  Patient ID: Keith Levy MRN: [id number] DOB/AGE: May 02, 1941 80 y.o.  Admit date: 05/22/2021 Referring Physician Amy Brodhead Primary Physician Rusty Aus, MD Primary Cardiologist None Reason for Consultation New onset AF  HPI:  Keith Levy is a 80 year old male with a history of PAD (undergoing femoral endarterectomy today), CKD stage III, hyperlipidemia, tobacco abuse, bipolar disorder, prediabetes for whom cardiology is consulted to evaluate atrial fibrillation noted preoperatively today.  Interval events: - Having issues with agitation and confusion. - Echo normal aside from bi-atrial enlargement.  - CBC decreased from 10.4-7.8 yesterday  Review of systems complete and found to be negative unless listed above     Past Medical History:  Diagnosis Date   Bipolar disorder ([place])    CKD (chronic kidney disease), stage III ([place])    Hyperlipidemia    PAD (peripheral artery disease) (HCC)    Pre-diabetes     Past Surgical History:  Procedure Laterality Date   ENDARTERECTOMY FEMORAL Right 05/22/2021   Procedure: RIGHT ENDARTERECTOMY FEMORAL; INSERTION OF SFA AND POPLITEAL STENTS, THROMBECTOMY ILIAC;  Surgeon: Algernon Huxley, MD;  Location: [facility] ORS;  Service: Vascular;  Laterality: Right;   INSERTION OF ILIAC STENT Bilateral 05/22/2021   Procedure: INSERTION OF ILIAC STENT, BILATERAL;  Surgeon: Algernon Huxley, MD;  Location: [facility] ORS;  Service: Vascular;  Laterality: Bilateral;   LOWER EXTREMITY ANGIOGRAPHY Right 10/22/2020   Procedure: LOWER EXTREMITY ANGIOGRAPHY;  Surgeon: Algernon Huxley, MD;  Location: [place] [facility];  Service: Cardiovascular;  Laterality: Right;   LOWER EXTREMITY ANGIOGRAPHY Left 11/01/2020   Procedure: LOWER EXTREMITY ANGIOGRAPHY;  Surgeon: Algernon Huxley, MD;  Location: [place] [facility];  Service: Cardiovascular;  Laterality: Left;   LOWER EXTREMITY ANGIOGRAPHY Right 12/06/2020   Procedure: LOWER EXTREMITY  ANGIOGRAPHY;  Surgeon: Algernon Huxley, MD;  Location: [place] [facility];  Service: Cardiovascular;  Laterality: Right;   LOWER EXTREMITY ANGIOGRAPHY Right 05/16/2021   Procedure: LOWER EXTREMITY ANGIOGRAPHY;  Surgeon: Algernon Huxley, MD;  Location: [place] [facility];  Service: Cardiovascular;  Laterality: Right;   VASCULAR SURGERY     stents on LLE and RLE    Medications Prior to Admission  Medication Sig Dispense Refill Last Dose   aspirin 81 MG EC tablet Take 81 mg by mouth daily.   05/21/2021   clopidogrel (PLAVIX) 75 MG tablet Take 1 tablet (75 mg total) by mouth daily. 30 tablet 11 05/21/2021   furosemide (LASIX) 20 MG tablet Take 20 mg by mouth daily.   05/21/2021   lithium carbonate 300 MG capsule Take 300 mg by mouth 2 (two) times daily.   05/22/2021   PARoxetine (PAXIL) 20 MG tablet Take 20 mg by mouth daily.   05/22/2021   QUEtiapine (SEROQUEL) 200 MG tablet Take 200 mg by mouth at bedtime.   05/21/2021   QUEtiapine (SEROQUEL) 50 MG tablet Take 50 mg by mouth at bedtime.   05/21/2021   simvastatin (ZOCOR) 20 MG tablet Take 20 mg by mouth daily.   05/22/2021   Social History   Socioeconomic History   Marital status: Married    Spouse name: Not on file   Number of children: Not on file   Years of education: Not on file   Highest education level: Not on file  Occupational History   Not on file  Tobacco Use   Smoking status: Former    Packs/day: 1.00    Types: Cigarettes    Quit  date: 05/12/2021    Years since quitting: 0.0   Smokeless tobacco: Never  Vaping Use   Vaping Use: Never used  Substance and Sexual Activity   Alcohol use: Yes    Alcohol/week: 8.0 standard drinks    Types: 6 Cans of beer, 2 Shots of liquor per week    Comment: 6 pack a week and couple mixed drinks/week   Drug use: Never   Sexual activity: Not on file  Other Topics Concern   Not on file  Social History Narrative   Not on file   Social Determinants of Health   Financial Resource Strain: Not on file   Food Insecurity: Not on file  Transportation Needs: Not on file  Physical Activity: Not on file  Stress: Not on file  Social Connections: Not on file  Intimate Partner Violence: Not on file    History reviewed. No pertinent family history.    Review of systems complete and found to be negative unless listed above      PHYSICAL EXAM  General: Well developed, well nourished, in no acute distress HEENT:  Normocephalic and atramatic Neck:  No JVD.  Lungs: Clear bilaterally to auscultation and percussion. Heart: Irregularly irregular. Normal S1 and S2 without gallops or murmurs.  Abdomen: Bowel sounds are positive, abdomen soft and non-tender  Msk:  Back normal, normal gait. Normal strength and tone for age. Extremities: Incision over right femoral artery without bleeding or hematoma.  No edema. Neuro: Alert and oriented X 3. Psych:  Good affect, responds appropriately  Labs:   Lab Results  Component Value Date   WBC 9.0 05/23/2021   HGB 7.8 (L) 05/23/2021   HCT 24.0 (L) 05/23/2021   MCV 88.6 05/23/2021   PLT 243 05/23/2021    Recent Labs  Lab 05/24/21 0453  NA 131*  K 3.7  CL 101  CO2 26  BUN 27*  CREATININE 1.68*  CALCIUM 9.2  GLUCOSE 122*    No results found for: CKTOTAL, CKMB, CKMBINDEX, TROPONINI No results found for: CHOL No results found for: HDL No results found for: LDLCALC No results found for: TRIG No results found for: CHOLHDL No results found for: LDLDIRECT    Radiology: CT ABDOMEN PELVIS W CONTRAST  Addendum Date: 05/23/2021   ADDENDUM REPORT: 05/23/2021 17:13 ADDENDUM: The original report was by Dr. Van Clines. The following addendum is by Dr. Van Clines: Critical Value/emergent results were called by telephone at the time of interpretation on 05/23/2021 at 4:58 pm to provider [name] , who verbally acknowledged these results. Electronically Signed   By: Van Clines M.D.   On: 05/23/2021 17:13   Result Date:  05/23/2021 CLINICAL DATA:  Atherosclerosis/peripheral vascular disease, lower extremity vascular surgery on 05/22/2021. We are asked to assess for imaging evidence of malignancy. EXAM: CT ABDOMEN AND PELVIS WITH CONTRAST TECHNIQUE: Multidetector CT imaging of the abdomen and pelvis was performed using the standard protocol following bolus administration of intravenous contrast. CONTRAST:  84m OMNIPAQUE IOHEXOL 350 MG/ML SOLN COMPARISON:  None. FINDINGS: Despite efforts by the technologist and patient, motion artifact is present on today's exam and could not be eliminated. This reduces exam sensitivity and specificity. Lower chest: Descending thoracic aortic, left anterior descending, and right coronary artery atherosclerotic calcification. Mild dependent atelectasis in the posterior basal segment left lower lobe. Hepatobiliary: Several gallstones are present in the gallbladder, showing nitrogen gas phenomenon. No biliary dilatation. The liver appears otherwise unremarkable. Pancreas: Unremarkable Spleen: Unremarkable Adrenals/Urinary Tract: The adrenal glands appear  normal. Exophytic 1.7 cm lesion of the right kidney upper pole anteriorly favors renal cyst. Other small hypodense bilateral renal lesions are technically too small to characterize. There is some equivocal hypodensity in the parenchyma of the left kidney lower pole for example on image 26 of series 7, correlate with urine analysis to exclude the possibility of pyelonephritis. No hydronephrosis or hydroureter. There is a Foley catheter in the urinary bladder along with a small amount of gas in the urinary bladder. Prominent prostate gland indents bladder base. Stomach/Bowel: Sigmoid colon diverticulosis without findings of active diverticulitis. Vascular/Lymphatic: Atherosclerosis is present, including aortoiliac atherosclerotic disease. Substantial atheromatous plaque in the celiac trunk, SMA, and proximal renal arteries better assessed with CT  angiography if clinically warranted. 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 Iliac artery stents bilaterally, these appear grossly patent. However, there is filling defect in the right superficial femoral vein for example on image 98 of series 2 compatible with DVT. Stent in the right SFA partially imaged. There is gas along the soft tissues of the right groin, not unexpected on postoperative day 1. There is some collateral venous structures in the subcutaneous tissues tracking anterior to the pubis. Reproductive: Moderate prostatomegaly. Other: No supplemental non-categorized findings. Musculoskeletal: Lumbar spondylosis and degenerative disc disease contributing to bilateral foraminal impingement at L3-4 and L4-5. IMPRESSION: 1. High suspicion for DVT in the right proximal superficial femoral vein. Consider sonographic correlation. 2. Postoperative findings in the right groin, with gas in the soft tissues not unexpected on postoperative day 1. 3. Extensive atherosclerosis.  Cholelithiasis. 4. Subtle hypoenhancement in the left kidney lower pole parenchyma, correlate with urine analysis to exclude pyelonephritis. 5. Prostatomegaly. 6. Sigmoid colon diverticulosis. 7. Lumbar spondylosis and degenerative disc disease contributing to foraminal impingement at L3-4 and L4-5. Radiology assistant personnel have been notified to put me in telephone contact with the referring physician or the referring physician's clinical representative in order to discuss these findings. Once this communication is established I will issue an addendum to this report for documentation purposes. Electronically Signed: By: Van Clines M.D. On: 05/23/2021 16:41   PERIPHERAL VASCULAR CATHETERIZATION  Result Date: 05/16/2021 See surgical note for result.  DG C-Arm 1-60 Min-No Report  Result Date: 05/22/2021 Fluoroscopy was utilized by the requesting physician.  No  radiographic interpretation.   VAS [place] ABI WITH/WO TBI  Result Date: 05/21/2021  LOWER EXTREMITY DOPPLER STUDY Patient Name:  OMARIO GUMAER  Date of Exam:   05/10/2021 Medical Rec #: [id number]         Accession #:    [id number] Date of Birth: Nov 23, 1940         Patient Gender: M Patient Age:   84 years Exam Location:  [facility] Procedure:      VAS [place] ABI WITH/WO TBI Referring Phys: Corene Cornea DEW --------------------------------------------------------------------------------  Indications: Peripheral artery disease.  Vascular Interventions: 10/22/20: Bilateral EIA & right SFA stents;                         11/01/20: Right CIA/EIA stent with right SFA/popliteal                         thrombectomy/PTA/stent;                         12/06/20: Right proximal SFA to AK popliteal stents x2;. Performing Technologist: Blondell Reveal RT, RDMS,  RVT  Examination Guidelines: A complete evaluation includes at minimum, Doppler waveform signals and systolic blood pressure reading at the level of bilateral brachial, anterior tibial, and posterior tibial arteries, when vessel segments are accessible. Bilateral testing is considered an integral part of a complete examination. Photoelectric Plethysmograph (PPG) waveforms and toe systolic pressure readings are included as required and additional duplex testing as needed. Limited examinations for reoccurring indications may be performed as noted.  ABI Findings: +---------+------------------+-----+------------+--------------------------+ Right    Rt Pressure (mmHg)IndexWaveform    Comment                    +---------+------------------+-----+------------+--------------------------+ Brachial 137                                                           +---------+------------------+-----+------------+--------------------------+ ATA      18                0.13 monophasic  continuous flow             +---------+------------------+-----+------------+--------------------------+ PTA                             occluded    no flow detected by duplex +---------+------------------+-----+------------+--------------------------+ PERO                            not detectedno flow detected by duplex +---------+------------------+-----+------------+--------------------------+ Great Toe                       not detected                           +---------+------------------+-----+------------+--------------------------+ +---------+------------------+-----+----------+-------+ Left     Lt Pressure (mmHg)IndexWaveform  Comment +---------+------------------+-----+----------+-------+ Brachial 142                                      +---------+------------------+-----+----------+-------+ ATA      78                0.55 monophasic        +---------+------------------+-----+----------+-------+ PTA      83                0.58 monophasic        +---------+------------------+-----+----------+-------+ Great Toe48                0.34 Abnormal          +---------+------------------+-----+----------+-------+ +-------+-----------+-----------+------------+------------+ ABI/TBIToday's ABIToday's TBIPrevious ABIPrevious TBI +-------+-----------+-----------+------------+------------+ Right  0.13       NA         0.94        0.67         +-------+-----------+-----------+------------+------------+ Left   0.58       0.34       0.97        1.01         +-------+-----------+-----------+------------+------------+ Bilateral ABIs appear decreased compared to prior study on 01/04/21.  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 Left: Resting left ankle-brachial index indicates moderate left lower extremity arterial disease. The left toe-brachial index is abnormal.  *  See table(s) above for measurements and observations.  Electronically signed by  [name] MD on 05/21/2021 at 9:15:14 AM.    Final    ECHOCARDIOGRAM COMPLETE  Result Date: 05/23/2021    ECHOCARDIOGRAM REPORT   Patient Name:   Edger Llewellyn Date of Exam: 05/22/2021 Medical Rec #:  [id number]        Height:       71.0 in Accession #:    [id number]       Weight:       164.7 lb Date of Birth:  07-Aug-1941        BSA:          1.941 m Patient Age:    53 years         BP:           110/71 mmHg Patient Gender: M                HR:           76 bpm. Exam Location:  [facility] Procedure: 2D Echo, Cardiac Doppler and Color Doppler Indications:     I48.91 Atrial Fibrillation  History:         Patient has no prior history of Echocardiogram examinations.                  Risk Factors:Dyslipidemia. Chronic kidney disease.  Sonographer:     Cresenciano Lick RDCS Referring Phys:  [id number] Andrez Grime Diagnosing Phys: Yolonda Kida MD IMPRESSIONS  1. Left ventricular ejection fraction, by estimation, is 55 to 60%. The left ventricle has normal function. The left ventricle has no regional wall motion abnormalities. Left ventricular diastolic function could not be evaluated.  2. Right ventricular systolic function is low normal. The right ventricular size is normal.  3. Left atrial size was mild to moderately dilated.  4. Right atrial size was mildly dilated.  5. The mitral valve is grossly normal. Mild mitral valve regurgitation.  6. The aortic valve is grossly normal. Aortic valve regurgitation is mild. FINDINGS  Left Ventricle: Left ventricular ejection fraction, by estimation, is 55 to 60%. The left ventricle has normal function. The left ventricle has no regional wall motion abnormalities. The left ventricular internal cavity size was normal in size. There is  borderline left ventricular hypertrophy. Left ventricular diastolic function could not be evaluated. Right Ventricle: The right ventricular size is normal. No increase in right ventricular wall thickness. Right ventricular systolic function  is low normal. Left Atrium: Left atrial size was mild to moderately dilated. Right Atrium: Right atrial size was mildly dilated. Pericardium: There is no evidence of pericardial effusion. Mitral Valve: The mitral valve is grossly normal. There is mild thickening of the mitral valve leaflet(s). There is mild calcification of the mitral valve leaflet(s). Mild mitral valve regurgitation. Tricuspid Valve: The tricuspid valve is normal in structure. Tricuspid valve regurgitation is mild. Aortic Valve: The aortic valve is grossly normal. Aortic valve regurgitation is mild. Aortic regurgitation PHT measures 338 msec. Pulmonic Valve: The pulmonic valve was normal in structure. Pulmonic valve regurgitation is not visualized. Aorta: The ascending aorta was not well visualized. IAS/Shunts: No atrial level shunt detected by color flow Doppler.  LEFT VENTRICLE PLAX 2D LVIDd:         4.80 cm LVIDs:         3.20 cm LV PW:         1.10 cm LV IVS:        1.10 cm  LVOT diam:     2.30 cm LV SV:         58 LV SV Index:   30 LVOT Area:     4.15 cm  RIGHT VENTRICLE             IVC RV Basal diam:  4.00 cm     IVC diam: 2.00 cm RV S prime:     13.27 cm/s TAPSE (M-mode): 1.9 cm LEFT ATRIUM             Index       RIGHT ATRIUM           Index LA diam:        4.90 cm 2.52 cm/m  RA Area:     14.90 cm LA Vol (A2C):   78.3 ml 40.33 ml/m RA Volume:   38.30 ml  19.73 ml/m LA Vol (A4C):   34.7 ml 17.87 ml/m LA Biplane Vol: 56.1 ml 28.90 ml/m  AORTIC VALVE LVOT Vmax:   80.05 cm/s LVOT Vmean:  58.150 cm/s LVOT VTI:    0.139 m AI PHT:      338 msec  AORTA Ao Root diam: 4.50 cm MV E velocity: 101.80 cm/s  TRICUSPID VALVE                             TR Peak grad:   20.8 mmHg                             TR Vmax:        228.00 cm/s                              SHUNTS                             Systemic VTI:  0.14 m                             Systemic Diam: 2.30 cm Dwayne Prince [place] MD Electronically signed by Yolonda Kida MD Signature  Date/Time: 05/23/2021/5:39:36 PM    Final     EKG: Atrial fibrillation with normal ventricular rate.  ASSESSMENT AND PLAN:  Neilson Melde is a 80 year old male with a history of PAD (undergoing femoral endarterectomy today), CKD stage III, hyperlipidemia, tobacco abuse, bipolar disorder, prediabetes for whom cardiology is consulted to evaluate atrial fibrillation noted preoperatively today.  #Atrial fibrillation The duration of this is unclear as the patient is completely asymptomatic.  His ventricular rate is normal between 60 and 80 bpm without any rate controlling medications.  Long-term anticoagulation is reasonable based on his risk factors, but there is no immediate rush in doing this. -CHA2DS2-VASc =  3 (age 17, vasc) -Long-term anticoagulation is reasonable, though there is of no immediate rush to start this.  Would start this when there is minimal risk of bleeding in the postoperative period -Would NOT recommend long-term triple therapy with aspirin, Plavix, and DOAC; would stop aspirin when DOAC is started.  -Echo was normal.  -No need for rate controlling medications at this time - Please schedule follow up with me 1-2 weeks after discharge and we can consider anticoagulation for AF at that time.   Signed: Andrez Grime MD 05/24/2021, 10:10  AM  

## 2021-05-24 NOTE — Progress Notes (Signed)
OT Cancellation Note  Patient Details Name: Keith Levy MRN: [id number] DOB: 09-15-1941   Cancelled Treatment:    Reason Eval/Treat Not Completed: Other (comment) CT abdomen pelvis showing "High suspicion for DVT in the right proximal superficial femoral Vein".  Per MD Dew plan to start anti-coagulation today anyway.  OT to hold today and re-attempt treatment when pt is able to safely participate in OT intervention.   Darleen Crocker, MS, OTR/L , CBIS ascom [phone]  05/24/21, 3:09 PM

## 2021-05-24 NOTE — Telephone Encounter (Signed)
Patient daughter has made the nurse aware at the hospital about receiving ct results.

## 2021-05-24 NOTE — TOC Progression Note (Addendum)
Transition of Care [facility]) - Progression Note    Patient Details  Name: Keith Levy MRN: [id number] Date of Birth: Dec 13, 1940  Transition of Care [facility]) CM/SW [place], LCSW Phone Number: 05/24/2021, 3:48 PM  Clinical Narrative:        Call to patient's daughter per her request. She asked for an update from the MD. She has many medical questions. Informed Dr. Lucky Cowboy of request.  She is asking about DC plan. Informed her PT eval is pending. She is interested in SNF rehab if recommended by PT. TOC will follow to assist with DC planning.     Expected Discharge Plan: [place] Barriers to Discharge: Continued Medical Work up  Expected Discharge Plan and Services Expected Discharge Plan: [place] arrangements for the past 2 months: Single Family Home                                       Social Determinants of Health (SDOH) Interventions    Readmission Risk Interventions No flowsheet data found.

## 2021-05-24 NOTE — Progress Notes (Signed)
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   Goal of Therapy:  Electrolytes WNL  Plan:  --Phosphorus slightly low today, but patient is eating so expect to improve --If continues to trend down, consider replacement --Continue to follow along  Tawnya Crook, PharmD, BCPS Clinical Pharmacist 05/24/2021 9:44 AM

## 2021-05-25 LAB — BASIC METABOLIC PANEL
Anion gap: 4 — ABNORMAL LOW (ref 5–15)
BUN: 21 mg/dL (ref 8–23)
CO2: 23 mmol/L (ref 22–32)
Calcium: 9 mg/dL (ref 8.9–10.3)
Chloride: 106 mmol/L (ref 98–111)
Creatinine, Ser: 1.22 mg/dL (ref 0.61–1.24)
GFR, Estimated: >60 mL/min (ref >60)
Glucose, Bld: 119 mg/dL — ABNORMAL HIGH (ref 70–99)
Potassium: 4 mmol/L (ref 3.5–5.1)
Sodium: 133 mmol/L — ABNORMAL LOW (ref 135–145)

## 2021-05-25 LAB — CBC
HCT: 25.1 % — ABNORMAL LOW (ref 39.0–52.0)
Hemoglobin: 8.2 g/dL — ABNORMAL LOW (ref 13.0–17.0)
MCH: 27.5 pg (ref 26.0–34.0)
MCHC: 32.7 g/dL (ref 30.0–36.0)
MCV: 84.2 fL (ref 80.0–100.0)
Platelets: 304 10*3/uL (ref 150–400)
RBC: 2.98 MIL/uL — ABNORMAL LOW (ref 4.22–5.81)
RDW: 15.6 % — ABNORMAL HIGH (ref 11.5–15.5)
WBC: 9.7 10*3/uL (ref 4.0–10.5)
nRBC: 0 % (ref 0.0–0.2)

## 2021-05-25 LAB — PHOSPHORUS: Phosphorus: 2.2 mg/dL — ABNORMAL LOW (ref 2.5–4.6)

## 2021-05-25 MED ORDER — K PHOS MONO-SOD PHOS DI & MONO 155-852-130 MG PO TABS
500.0000 mg | ORAL_TABLET | ORAL | Status: AC
  Administered 2021-05-25 – 2021-05-26 (×4): 500 mg via ORAL
  Filled 2021-05-25 (×4): qty 2

## 2021-05-25 NOTE — TOC Progression Note (Addendum)
Transition of Care [facility]) - Progression Note    Patient Details  Name: Keith Levy MRN: [id number] Date of Birth: 03-06-1941  Transition of Care [facility]) CM/SW [place], LCSW Phone Number: 05/25/2021, 10:31 AM  Clinical Narrative:   CSW received call from daughter [name]. She is requesting PT to see patient so they can determine if he needs SNF rehab. She thinks patient does need rehab due to living home alone. Explained process and asked PT if patient will be able to be seen today. [name] reported patient has had 2 COVID vaccines.  If SNF is recommended, [name] does not have a SNF preference. Explained medicare.gov ratings for reference. Explained options of Home Health and for applying for PCS services at home when patient does go home.  Provided weekday CSW # (also covering tomorrow) to [facility] per her request.     Expected Discharge Plan: [place] Barriers to Discharge: Continued Medical Work up  Expected Discharge Plan and Services Expected Discharge Plan: [place] arrangements for the past 2 months: Single Family Home                                       Social Determinants of Health (SDOH) Interventions    Readmission Risk Interventions No flowsheet data found.

## 2021-05-25 NOTE — Progress Notes (Signed)
3 Days Post-Op   Subjective/Chief Complaint: Patient comfortable, denies any issues.    Objective: Vital signs in last 24 hours: Temp:  [97.6 F (36.4 C)-98.9 F (37.2 C)] 98.2 F (36.8 C) (09/10 1625) Pulse Rate:  [69-93] 93 (09/10 1625) Resp:  [16-19] 18 (09/10 1625) BP: (106-170)/(60-98) 118/62 (09/10 1625) SpO2:  [95 %-100 %] 100 % (09/10 1625) Last BM Date:  (PTA)  Intake/Output from previous day: 09/09 0701 - 09/10 0700 In: 600 [P.O.:600] Out: 200 [Urine:200] Intake/Output this shift: No intake/output data recorded.  General appearance: alert and cooperative Resp: clear to auscultation bilaterally Cardio: regular rate and rhythm, S1, S2 normal, no murmur, click, rub or gallop Groin incisions are clean and intact.   Lab Results:  Recent Labs    05/23/21 0355 05/25/21 0459  WBC 9.0 9.7  HGB 7.8* 8.2*  HCT 24.0* 25.1*  PLT 243 304   BMET Recent Labs    05/24/21 0453 05/25/21 0459  NA 131* 133*  K 3.7 4.0  CL 101 106  CO2 26 23  GLUCOSE 122* 119*  BUN 27* 21  CREATININE 1.68* 1.22  CALCIUM 9.2 9.0   PT/INR No results for input(s): LABPROT, INR in the last 72 hours. ABG No results for input(s): PHART, HCO3 in the last 72 hours.  Invalid input(s): PCO2, PO2  Studies/Results: No results found.  Anti-infectives: Anti-infectives (From admission, onward)    Start     Dose/Rate Route Frequency Ordered Stop   05/22/21 1600  ceFAZolin (ANCEF) IVPB 2g/100 mL premix        2 g 200 mL/hr over 30 Minutes Intravenous Every 8 hours 05/22/21 1344 05/23/21 0031   05/22/21 0815  ceFAZolin (ANCEF) 2-4 GM/100ML-% IVPB       Note to Pharmacy: Trudie Reed   : cabinet override      05/22/21 0815 05/22/21 1628   05/22/21 0600  ceFAZolin (ANCEF) IVPB 2g/100 mL premix        2 g 200 mL/hr over 30 Minutes Intravenous On call to O.R. 05/22/21 0032 05/22/21 0904       Assessment/Plan: s/p Procedure(s): RIGHT ENDARTERECTOMY FEMORAL; INSERTION OF SFA AND POPLITEAL  STENTS, THROMBECTOMY ILIAC (Right) INSERTION OF ILIAC STENT, BILATERAL (Bilateral) APPLICATION OF CELL SAVER (N/A) Continue with current plan.  Discharge  planning in am.  LOS: 3 days    Elmore Guise 05/25/2021

## 2021-05-25 NOTE — Plan of Care (Signed)
  Problem: Activity: Goal: Risk for activity intolerance will decrease Outcome: Progressing   Problem: Nutrition: Goal: Adequate nutrition will be maintained Outcome: Progressing   

## 2021-05-25 NOTE — Evaluation (Signed)
Physical Therapy Evaluation Patient Details Name: Keith Levy MRN: [id number] DOB: 1941-06-04 Today's Date: 05/25/2021   History of Present Illness  80 year old male was admitted 05/22/21 for LE pain from PAD, worsening rest pain on RLE, and was given R femoral endarterectomy wiht insertion of SFA and popliteal stents, R groin thrombectomy with B iliac stents on 9/7.  PMHx: PAD, RLE DVT, tobacco abuse, EtOH abuse, a-fib, HLD, bipolar disorder, prediabetes, CKD 3  Clinical Impression  PT received permission from nursing to move pt today, since he was on hold for DVT concern.  Pt is fairly weak, and demonstrates difficulty balancing himself and managing his walker, stumbles and moves with jerking quality at times, and sits without warning on side of bed.  Maintained contact with side of bed to sidestep, with 6' total accomplished.  Pt was assisted to bed and note after walk is 100% O2 sat with pulse 67.  Pt is on supplemental O2 and maintained with therapy session.  Will ask for MD permission to wean if appropriate, and follow up with goals of acute PT.  Pt is requested to go to SNF given his history of independent gait on SPC vs current function.    Follow Up Recommendations SNF    Equipment Recommendations  None recommended by PT    Recommendations for Other Services       Precautions / Restrictions Precautions Precautions: Fall (recent R femoral endarterectomy) Precaution Comments: confusion Restrictions Weight Bearing Restrictions: No      Mobility  Bed Mobility Overal bed mobility: Needs Assistance Bed Mobility: Supine to Sit;Sit to Supine     Supine to sit: Min assist;HOB elevated Sit to supine: Min assist;Mod assist;HOB elevated   General bed mobility comments: instruction for sequence and safety with IV and tele unit    Transfers Overall transfer level: Needs assistance Equipment used: Rolling walker (2 wheeled);1 person hand held assist Transfers: Sit to/from  Stand Sit to Stand: Mod assist;Min assist         General transfer comment: mod to initiate and then min to steady  Ambulation/Gait Ambulation/Gait assistance: Min assist;Mod assist Gait Distance (Feet): 6 Feet (3  x 2) Assistive device: Rolling walker (2 wheeled);1 person hand held assist Gait Pattern/deviations: Step-to pattern;Decreased stride length;Wide base of support;Trunk flexed Gait velocity: reduced   General Gait Details: pt is lethargic and weak, sidesteps a couple feet then sits without controlling descent  Stairs            Wheelchair Mobility    Modified Rankin (Stroke Patients Only)       Balance Overall balance assessment: Needs assistance Sitting-balance support: Bilateral upper extremity supported;Feet supported Sitting balance-Leahy Scale: Good     Standing balance support: Bilateral upper extremity supported;During functional activity Standing balance-Leahy Scale: Poor                               Pertinent Vitals/Pain Pain Assessment: Faces Faces Pain Scale: Hurts a little bit Pain Location: R  hip and leg Pain Descriptors / Indicators: Operative site guarding Pain Intervention(s): Limited activity within patient's tolerance;Monitored during session;Repositioned    Home Living Family/patient expects to be discharged to:: Private residence Living Arrangements: Alone Available Help at Discharge: Family;Available PRN/intermittently Type of Home: House Home Access: Stairs to enter Entrance Stairs-Rails: None Entrance Stairs-Number of Steps: 2- from garage Home Layout: Multi-level;Able to live on main level with bedroom/bathroom Home Equipment: Kasandra Knudsen - single point;Shower seat  Prior Function Level of Independence: Independent with assistive device(s)         Comments: SPC when up minimally per daughter     Hand Dominance   Dominant Hand: Right    Extremity/Trunk Assessment   Upper Extremity Assessment Upper  Extremity Assessment: Defer to OT evaluation    Lower Extremity Assessment Lower Extremity Assessment: Generalized weakness;RLE deficits/detail RLE Deficits / Details: generalized weakness but able to assist to side of bed RLE Coordination: decreased gross motor    Cervical / Trunk Assessment Cervical / Trunk Assessment: Kyphotic (mild)  Communication   Communication: No difficulties  Cognition Arousal/Alertness: Awake/alert Behavior During Therapy: WFL for tasks assessed/performed Overall Cognitive Status: No family/caregiver present to determine baseline cognitive functioning                                 General Comments: pt did not have much history to offer      General Comments General comments (skin integrity, edema, etc.): PT asked nursing for permission to move pt who was on a hold for DVT suspicision, received permission to attempt to move pt    Exercises     Assessment/Plan    PT Assessment Patient needs continued PT services  PT Problem List Decreased strength;Decreased activity tolerance;Decreased balance;Decreased coordination;Decreased knowledge of use of DME;Decreased safety awareness;Cardiopulmonary status limiting activity;Decreased skin integrity       PT Treatment Interventions DME instruction;Gait training;Functional mobility training;Therapeutic activities;Therapeutic exercise;Balance training;Neuromuscular re-education;Patient/family education;Stair training    PT Goals (Current goals can be found in the Care Plan section)  Acute Rehab PT Goals Patient Stated Goal: to go home PT Goal Formulation: With patient Time For Goal Achievement: 06/08/21 Potential to Achieve Goals: Fair    Frequency Min 2X/week   Barriers to discharge Inaccessible home environment;Decreased caregiver support home independently per pt    Co-evaluation               AM-PAC PT "6 Clicks" Mobility  Outcome Measure Help needed turning from your back  to your side while in a flat bed without using bedrails?: A Lot Help needed moving from lying on your back to sitting on the side of a flat bed without using bedrails?: A Lot Help needed moving to and from a bed to a chair (including a wheelchair)?: A Lot Help needed standing up from a chair using your arms (e.g., wheelchair or bedside chair)?: A Lot Help needed to walk in hospital room?: A Lot Help needed climbing 3-5 steps with a railing? : Total 6 Click Score: 11    End of Session Equipment Utilized During Treatment: Gait belt Activity Tolerance: Patient tolerated treatment well;Patient limited by fatigue Patient left: in bed;with call bell/phone within reach;with bed alarm set;with nursing/sitter in room (per nsg request) Nurse Communication: Mobility status PT Visit Diagnosis: Unsteadiness on feet (R26.81);Muscle weakness (generalized) (M62.81);Difficulty in walking, not elsewhere classified (R26.2)    Time: [id number] PT Time Calculation (min) (ACUTE ONLY): 27 min   Charges:   PT Evaluation $PT Eval Moderate Complexity: 1 Mod PT Treatments $Therapeutic Activity: 8-22 mins       Ramond Dial 05/25/2021, 4:45 PM  Mee Hives, PT MS Acute Rehab Dept. Number: [place] [facility] [place]

## 2021-05-25 NOTE — Progress Notes (Signed)
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   Goal of Therapy:  Electrolytes WNL  Plan:  K 4.0  Phos 2.2  Scr 1.22   --Will order KPhos neutral 2 tabs PO q4hr x 4 doses - pt having agitation possibly from ETOH withdrawal? --f/u labs in am  Noralee Space, PharmD Clinical Pharmacist 05/25/2021 10:00 AM

## 2021-05-26 LAB — BASIC METABOLIC PANEL
Anion gap: 5 (ref 5–15)
BUN: 21 mg/dL (ref 8–23)
CO2: 25 mmol/L (ref 22–32)
Calcium: 8.8 mg/dL — ABNORMAL LOW (ref 8.9–10.3)
Chloride: 105 mmol/L (ref 98–111)
Creatinine, Ser: 1.32 mg/dL — ABNORMAL HIGH (ref 0.61–1.24)
GFR, Estimated: 55 mL/min — ABNORMAL LOW (ref >60)
Glucose, Bld: 120 mg/dL — ABNORMAL HIGH (ref 70–99)
Potassium: 4 mmol/L (ref 3.5–5.1)
Sodium: 135 mmol/L (ref 135–145)

## 2021-05-26 LAB — MAGNESIUM: Magnesium: 2.3 mg/dL (ref 1.7–2.4)

## 2021-05-26 LAB — PHOSPHORUS: Phosphorus: 3.8 mg/dL (ref 2.5–4.6)

## 2021-05-26 NOTE — NC FL2 (Addendum)
[place] LEVEL OF CARE SCREENING TOOL     IDENTIFICATION  Patient Name: Keith Levy Birthdate: 01/05/1941 Sex: male Admission Date (Current Location): 05/22/2021  [facility] and [place] Number:  Engineering geologist and Address:  [facility], [address] [place], [place] [address]      Provider Number: [id number]  Attending Physician Name and Address:  Algernon Huxley, MD  Relative Name and Phone Number:       Current Level of Care: Hospital Recommended Level of Care: [place] Prior Approval Number:    Date Approved/Denied:   PASRR Number: Manual review  Discharge Plan: SNF    Current Diagnoses: Patient Active Problem List   Diagnosis Date Noted   Malnutrition of moderate degree 05/24/2021   Atherosclerosis of artery of extremity with rest pain ([place]) 05/22/2021   Tobacco use disorder 05/10/2021   Chronic alcohol abuse 11/13/2020   Atherosclerosis of native arteries of extremity with intermittent claudication ([place]) 10/16/2020   CKD (chronic kidney disease) stage 3, GFR 30-59 ml/min ([place]) 04/27/2019   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 ([place]) 04/27/2019   Centrilobular emphysema ([place]) 04/14/2018   Medicare annual wellness visit, initial 04/07/2017   Mild major depression ([place]) 03/13/2017   Bipolar 1 disorder ([place]) 10/06/2016   Tubular adenoma 10/06/2016   Combined hyperlipidemia 06/26/2014   Atherosclerosis of native arteries of extremity with rest pain ([place]) 06/26/2014    Orientation RESPIRATION BLADDER Height & Weight     Self, Situation, Place  Normal Incontinent, External catheter Weight: 164 lb 10.9 oz (74.7 kg) Height:  '5\' 11"'$  (579FGE cm)  BEHAVIORAL SYMPTOMS/MOOD NEUROLOGICAL BOWEL NUTRITION STATUS   (None)  (None) Continent Diet (Regular)  AMBULATORY STATUS COMMUNICATION OF NEEDS Skin   Limited Assist Verbally Bruising, Surgical wounds (Incision on right groin (honeycomb).)                        Personal Care Assistance Level of Assistance  Bathing, Feeding, Dressing Bathing Assistance: Limited assistance Feeding assistance: Limited assistance Dressing Assistance: Limited assistance     Functional Limitations Info  Sight, Hearing, Speech Sight Info: Adequate Hearing Info: Adequate Speech Info: Adequate    SPECIAL CARE FACTORS FREQUENCY  PT (By licensed PT), OT (By licensed OT)     PT Frequency: 5 x week OT Frequency: 5 x week            Contractures Contractures Info: Not present    Additional Factors Info  Code Status, Allergies, Psychotropic Code Status Info: DNR Allergies Info: NKDA Psychotropic Info: Bipolar I         Current Medications (05/26/2021):  This is the current hospital active medication list Current Facility-Administered Medications  Medication Dose Route Frequency Provider Last Rate Last Admin   0.9 %  sodium chloride infusion   Intravenous Continuous Algernon Huxley, MD 100 mL/hr at 05/26/21 0719 New Bag at 05/26/21 0719   0.9 %  sodium chloride infusion  500 mL Intravenous Once PRN Algernon Huxley, MD       acetaminophen (TYLENOL) tablet 325-650 mg  325-650 mg Oral Q4H PRN Algernon Huxley, MD       Or   acetaminophen (TYLENOL) suppository 325-650 mg  325-650 mg Rectal Q4H PRN Algernon Huxley, MD       alum & mag hydroxide-simeth (MAALOX/MYLANTA) 200-200-20 MG/5ML suspension 15-30 mL  15-30 mL Oral Q2H PRN Lucky Cowboy, Erskine Squibb, MD  apixaban (ELIQUIS) tablet 5 mg  5 mg Oral BID Algernon Huxley, MD   5 mg at 05/26/21 0940   aspirin EC tablet 81 mg  81 mg Oral Daily Algernon Huxley, MD   81 mg at 05/26/21 0940   docusate sodium (COLACE) capsule 100 mg  100 mg Oral Daily Algernon Huxley, MD   100 mg at 05/26/21 0940   famotidine (PEPCID) tablet 20 mg  20 mg Oral Daily Renda Rolls, RPH   20 mg at 05/26/21 0940   feeding supplement (ENSURE ENLIVE / ENSURE PLUS) liquid 237 mL  237 mL Oral TID BM Algernon Huxley, MD   237 mL at [id number]   folic  acid (FOLVITE) tablet 1 mg  1 mg Oral Daily Algernon Huxley, MD   1 mg at 05/26/21 0940   guaiFENesin-dextromethorphan (ROBITUSSIN DM) 100-10 MG/5ML syrup 15 mL  15 mL Oral Q4H PRN Algernon Huxley, MD       hydrALAZINE (APRESOLINE) injection 5 mg  5 mg Intravenous Q20 Min PRN Dew, Erskine Squibb, MD       iohexol (OMNIPAQUE) 350 MG/ML injection 80 mL  80 mL Intravenous Once PRN Algernon Huxley, MD       labetalol (NORMODYNE) injection 10 mg  10 mg Intravenous Q10 min PRN Algernon Huxley, MD       lithium carbonate capsule 300 mg  300 mg Oral BID Algernon Huxley, MD   300 mg at 05/26/21 0943   LORazepam (ATIVAN) tablet 1-4 mg  1-4 mg Oral Q1H PRN Algernon Huxley, MD   1 mg at 05/26/21 0038   Or   LORazepam (ATIVAN) injection 1-4 mg  1-4 mg Intravenous Q1H PRN Algernon Huxley, MD   2 mg at 05/24/21 2102   magnesium sulfate IVPB 2 g 50 mL  2 g Intravenous Daily PRN Algernon Huxley, MD       metoprolol tartrate (LOPRESSOR) injection 2-5 mg  2-5 mg Intravenous Q2H PRN Algernon Huxley, MD       morphine 2 MG/ML injection 2-5 mg  2-5 mg Intravenous Q1H PRN Algernon Huxley, MD       multivitamin with minerals tablet 1 tablet  1 tablet Oral Daily Algernon Huxley, MD   1 tablet at 05/26/21 0940   ondansetron (ZOFRAN) injection 4 mg  4 mg Intravenous Q6H PRN Algernon Huxley, MD   4 mg at 05/25/21 1553   oxyCODONE-acetaminophen (PERCOCET/ROXICET) 5-325 MG per tablet 1-2 tablet  1-2 tablet Oral Q4H PRN Algernon Huxley, MD       PARoxetine (PAXIL) tablet 20 mg  20 mg Oral Daily Algernon Huxley, MD   20 mg at 05/26/21 0943   phenol (CHLORASEPTIC) mouth spray 1 spray  1 spray Mouth/Throat PRN Algernon Huxley, MD       potassium chloride SA (KLOR-CON) CR tablet 20-40 mEq  20-40 mEq Oral Daily PRN Algernon Huxley, MD       QUEtiapine (SEROQUEL) tablet 200 mg  200 mg Oral QHS Algernon Huxley, MD   200 mg at 05/25/21 2145   QUEtiapine (SEROQUEL) tablet 50 mg  50 mg Oral QHS Algernon Huxley, MD   50 mg at 05/25/21 2145   senna-docusate (Senokot-S) tablet 1 tablet  1 tablet  Oral QHS PRN Algernon Huxley, MD       simvastatin (ZOCOR) tablet 20 mg  20 mg Oral Daily Dew, Jason S,  MD   20 mg at 05/26/21 0942   sorbitol 70 % solution 30 mL  30 mL Oral Daily PRN Algernon Huxley, MD       spiritus frumenti (ethyl alcohol) solution 1 each  1 each Oral TID with meals Lucky Cowboy, Erskine Squibb, MD   1 each at 05/26/21 0940   thiamine tablet 100 mg  100 mg Oral Daily Algernon Huxley, MD   100 mg at 05/26/21 0940   Or   thiamine (B-1) injection 100 mg  100 mg Intravenous Daily Algernon Huxley, MD         Discharge Medications: Please see discharge summary for a list of discharge medications.  Relevant Imaging Results:  Relevant Lab Results:   Additional Information SS#: [id number]. Per daughter, has received two COVID vaccines.  Candie Chroman, LCSW

## 2021-05-26 NOTE — Progress Notes (Signed)
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   Goal of Therapy:  Electrolytes WNL  Plan:  K 4.0  Phos 3.8  Mag 2.3  Scr 1.32  --No electrolyte replacement at this time - possible ETOH withdrawal --f/u labs in am  Noralee Space, PharmD Clinical Pharmacist 05/26/2021 10:49 AM

## 2021-05-26 NOTE — Progress Notes (Addendum)
4 Days Post-Op   Subjective/Chief Complaint: Patient is comfortable eating lunch.  He denies any issues.  He is waiting for SNF placement.    Objective: Vital signs in last 24 hours: Temp:  [98.2 F (36.8 C)-99.2 F (37.3 C)] 98.9 F (37.2 C) (09/11 0744) Pulse Rate:  [71-93] 79 (09/11 0744) Resp:  [16-24] 18 (09/11 0744) BP: (118-137)/(54-71) 127/66 (09/11 0744) SpO2:  [95 %-100 %] 95 % (09/11 0744) Last BM Date:  (PTA)  Intake/Output from previous day: 09/10 0701 - 09/11 0700 In: 340 [P.O.:340] Out: 402 [Urine:402] Intake/Output this shift: Total I/O In: 120 [P.O.:120] Out: -   General appearance: alert, cooperative, appears stated age, and no distress Resp: clear to auscultation bilaterally Cardio: regular rate and rhythm, S1, S2 normal, no murmur, click, rub or gallop Extremities: extremities normal, atraumatic, no cyanosis or edema Incision/Wound: Right groin wound clean and dry.  Lab Results:  Recent Labs    05/25/21 0459  WBC 9.7  HGB 8.2*  HCT 25.1*  PLT 304   BMET Recent Labs    05/25/21 0459 05/26/21 0444  NA 133* 135  K 4.0 4.0  CL 106 105  CO2 23 25  GLUCOSE 119* 120*  BUN 21 21  CREATININE 1.22 1.32*  CALCIUM 9.0 8.8*   PT/INR No results for input(s): LABPROT, INR in the last 72 hours. ABG No results for input(s): PHART, HCO3 in the last 72 hours.  Invalid input(s): PCO2, PO2  Studies/Results: No results found.  Anti-infectives: Anti-infectives (From admission, onward)    Start     Dose/Rate Route Frequency Ordered Stop   05/22/21 1600  ceFAZolin (ANCEF) IVPB 2g/100 mL premix        2 g 200 mL/hr over 30 Minutes Intravenous Every 8 hours 05/22/21 1344 05/23/21 0031   05/22/21 0815  ceFAZolin (ANCEF) 2-4 GM/100ML-% IVPB       Note to Pharmacy: Trudie Reed   : cabinet override      05/22/21 0815 05/22/21 1628   05/22/21 0600  ceFAZolin (ANCEF) IVPB 2g/100 mL premix        2 g 200 mL/hr over 30 Minutes Intravenous On call to O.R.  05/22/21 0032 05/22/21 0904       Assessment/Plan: s/p Procedure(s): RIGHT ENDARTERECTOMY FEMORAL; INSERTION OF SFA AND POPLITEAL STENTS, THROMBECTOMY ILIAC (Right) INSERTION OF ILIAC STENT, BILATERAL (Bilateral) APPLICATION OF CELL SAVER (N/A) Awaiting disposition to SNF  LOS: 4 days    Elmore Guise 05/26/2021

## 2021-05-26 NOTE — TOC CM/SW Note (Signed)
RE: Keith Levy Date of Birth: November 16, 1940 Date: 05/26/2021   To Whom It May Concern:  Please be advised that the above-named patient will require a short-term nursing home stay - anticipated 30 days or less for rehabilitation and strengthening.  The plan is for return home.

## 2021-05-27 LAB — BASIC METABOLIC PANEL
Anion gap: 4 — ABNORMAL LOW (ref 5–15)
BUN: 15 mg/dL (ref 8–23)
CO2: 24 mmol/L (ref 22–32)
Calcium: 8.7 mg/dL — ABNORMAL LOW (ref 8.9–10.3)
Chloride: 108 mmol/L (ref 98–111)
Creatinine, Ser: 1.28 mg/dL — ABNORMAL HIGH (ref 0.61–1.24)
GFR, Estimated: 57 mL/min — ABNORMAL LOW (ref >60)
Glucose, Bld: 116 mg/dL — ABNORMAL HIGH (ref 70–99)
Potassium: 4.2 mmol/L (ref 3.5–5.1)
Sodium: 136 mmol/L (ref 135–145)

## 2021-05-27 LAB — PHOSPHORUS: Phosphorus: 2.5 mg/dL (ref 2.5–4.6)

## 2021-05-27 NOTE — Progress Notes (Signed)
Physical Therapy Treatment Patient Details Name: Keith Levy MRN: [id number] DOB: 03-May-1941 Today's Date: 05/27/2021   History of Present Illness 80 year old male was admitted 05/22/21 for LE pain from PAD, worsening rest pain on RLE, and was given R femoral endarterectomy wiht insertion of SFA and popliteal stents, R groin thrombectomy with B iliac stents on 9/7.  PMHx: PAD, RLE DVT, tobacco abuse, EtOH abuse, a-fib, HLD, bipolar disorder, prediabetes, CKD 3    PT Comments    Ready for session.  To EOB with supervision.  Does well and steady in sitting.  Stands with verbal and tactile cues with min a x 1.  Generally unsteady in standing with weak LE's.  He is able to step some in place and SLR.  He is able to take several steps to recliner after seated rest.  Then an additional static standing for 2 minutes after seated rest.  Pt has poor hand placements and carryover on each attempt.  He tries to pull up on walker and needs verbal and tactile cues to push and reach back to chair.  He remains in recliner after session with alarm on, floor mats down and unit secretary aware as tech was off floor for the moment. Educated on call bell use and needs in reach.    Recommendations for follow up therapy are one component of a multi-disciplinary discharge planning process, led by the attending physician.  Recommendations may be updated based on patient status, additional functional criteria and insurance authorization.  Follow Up Recommendations  SNF     Equipment Recommendations  None recommended by PT    Recommendations for Other Services       Precautions / Restrictions Precautions Precautions: Fall Precaution Comments: confusion- high fall risk Restrictions Weight Bearing Restrictions: No     Mobility  Bed Mobility Overal bed mobility: Needs Assistance Bed Mobility: Supine to Sit     Supine to sit: Supervision          Transfers Overall transfer level: Needs  assistance Equipment used: Rolling walker (2 wheeled) Transfers: Sit to/from Stand Sit to Stand: Min assist            Ambulation/Gait Ambulation/Gait assistance: Min guard Gait Distance (Feet): 3 Feet Assistive device: Rolling walker (2 wheeled) Gait Pattern/deviations: Step-to pattern;Decreased stride length;Wide base of support;Trunk flexed Gait velocity: reduced   General Gait Details: knees weak with stepping but no buckling today.  high fall risk so gait limited to chair and not away from bed with only +1 assist.  would benefit from chair follow if attempted.   Stairs             Wheelchair Mobility    Modified Rankin (Stroke Patients Only)       Balance Overall balance assessment: Needs assistance Sitting-balance support: Bilateral upper extremity supported;Feet supported       Standing balance support: Bilateral upper extremity supported;During functional activity Standing balance-Leahy Scale: Poor Standing balance comment: generaly unsteady and shakey                            Cognition Arousal/Alertness: Awake/alert Behavior During Therapy: WFL for tasks assessed/performed Overall Cognitive Status: No family/caregiver present to determine baseline cognitive functioning                                        Exercises  General Comments        Pertinent Vitals/Pain Pain Assessment: No/denies pain    Home Living                      Prior Function            PT Goals (current goals can now be found in the care plan section) Progress towards PT goals: Progressing toward goals    Frequency    Min 2X/week      PT Plan Current plan remains appropriate    Co-evaluation              AM-PAC PT "6 Clicks" Mobility   Outcome Measure  Help needed turning from your back to your side while in a flat bed without using bedrails?: None Help needed moving from lying on your back to sitting on  the side of a flat bed without using bedrails?: None Help needed moving to and from a bed to a chair (including a wheelchair)?: A Lot Help needed standing up from a chair using your arms (e.g., wheelchair or bedside chair)?: A Little Help needed to walk in hospital room?: A Lot Help needed climbing 3-5 steps with a railing? : Total 6 Click Score: 16    End of Session Equipment Utilized During Treatment: Gait belt Activity Tolerance: Patient tolerated treatment well;Patient limited by fatigue Patient left: with call bell/phone within reach;with nursing/sitter in room;in chair;with chair alarm set Nurse Communication: Mobility status PT Visit Diagnosis: Unsteadiness on feet (R26.81);Muscle weakness (generalized) (M62.81);Difficulty in walking, not elsewhere classified (R26.2)     Time: [id number] PT Time Calculation (min) (ACUTE ONLY): 14 min  Charges:  $Therapeutic Activity: 23-37 mins                    Chesley Noon, PTA 05/27/21, 3:50 PM , 3:47 PM

## 2021-05-27 NOTE — Care Management Important Message (Signed)
Important Message  Patient Details  Name: Keith Levy MRN: [id number] Date of Birth: [birth year]   Medicare Important Message Given:  Yes     Dannette Barbara 05/27/2021, 1:00 PM

## 2021-05-27 NOTE — Progress Notes (Signed)
Occupational Therapy Treatment Patient Details Name: Cristoval Shines MRN: [id number] DOB: [birth year] Today's Date: 05/27/2021   History of present illness 80 year old male was admitted 05/22/21 for LE pain from PAD, worsening rest pain on RLE, and was given R femoral endarterectomy wiht insertion of SFA and popliteal stents, R groin thrombectomy with B iliac stents on 9/7.  PMHx: PAD, RLE DVT, tobacco abuse, EtOH abuse, a-fib, HLD, bipolar disorder, prediabetes, CKD 3   OT comments  Upon entering the room, pt supine in bed with no c/o pain and agreeable to OT intervention. Pt is alert and oriented to himself and location. He is motivated to work with therapy. Pt performs supine >sit with  min A for trunk support. He is able to move B LEs off bed with increased time and min cuing for task. Pt standing x 2 reps with min A from standard height bed. Pt having difficulty following very commands and performed much better with demonstrational cuing for side steps, marching in place, and standing with cuing for upright posture. Pt does not report any dizziness with task. Pt does require assistance to change hospital gown while seated EOB with food all over gown and skin. Pt returning to supine at end of session secondary to fatigue. All needs within reach. Pt continues to benefit from OT intervention.    Recommendations for follow up therapy are one component of a multi-disciplinary discharge planning process, led by the attending physician.  Recommendations may be updated based on patient status, additional functional criteria and insurance authorization.    Follow Up Recommendations  SNF;Supervision - Intermittent    Equipment Recommendations  Other (comment) (defer to next venue of care)       Precautions / Restrictions Precautions Precautions: Fall Precaution Comments: confusion- high fall risk       Mobility Bed Mobility Overal bed mobility: Needs Assistance Bed Mobility: Supine to Sit;Sit to  Supine     Supine to sit: Min assist Sit to supine: Min assist   General bed mobility comments: min A for trunk support with increased time to complete tasks.    Transfers Overall transfer level: Needs assistance Equipment used: Rolling walker (2 wheeled) Transfers: Sit to/from Stand Sit to Stand: Min assist Stand pivot transfers: Min assist       General transfer comment: min A to initiate stand and for functional mobility    Balance Overall balance assessment: Needs assistance Sitting-balance support: Bilateral upper extremity supported;Feet supported Sitting balance-Leahy Scale: Good     Standing balance support: Bilateral upper extremity supported;During functional activity Standing balance-Leahy Scale: Poor                             ADL either performed or assessed with clinical judgement                                                  Vision Patient Visual Report: No change from baseline            Cognition Arousal/Alertness: Awake/alert Behavior During Therapy: WFL for tasks assessed/performed Overall Cognitive Status: No family/caregiver present to determine baseline cognitive functioning                                 General  Comments: Pt is more agreeable and reports feeling better this morning. Pt oriented to self and location. Needing assistance to orient to time. Follows all commands with increased time and min multimodal cuing.                   Pertinent Vitals/ Pain       Pain Assessment: Faces Faces Pain Scale: No hurt         Frequency  Min 2X/week        Progress Toward Goals  OT Goals(current goals can now be found in the care plan section)  Progress towards OT goals: Progressing toward goals  Acute Rehab OT Goals Patient Stated Goal: to go home OT Goal Formulation: With patient Time For Goal Achievement: 06/06/21 Potential to Achieve Goals: [place] Discharge plan  remains appropriate;Frequency remains appropriate    Co-evaluation                 AM-PAC OT "6 Clicks" Daily Activity     Outcome Measure   Help from another person eating meals?: None Help from another person taking care of personal grooming?: A Little Help from another person toileting, which includes using toliet, bedpan, or urinal?: A Lot Help from another person bathing (including washing, rinsing, drying)?: A Lot Help from another person to put on and taking off regular upper body clothing?: A Little Help from another person to put on and taking off regular lower body clothing?: A Lot 6 Click Score: 16    End of Session Equipment Utilized During Treatment: Rolling walker  OT Visit Diagnosis: Unsteadiness on feet (R26.81);Muscle weakness (generalized) (M62.81)   Activity Tolerance Patient limited by fatigue   Patient Left in bed;with call bell/phone within reach;with bed alarm set   Nurse Communication Mobility status        Time: 1000-1023 OT Time Calculation (min): 23 min  Charges: OT General Charges $OT Visit: 1 Visit OT Treatments $Therapeutic Activity: 23-37 mins  Darleen Crocker, MS, OTR/L , CBIS ascom [phone]  05/27/21, 12:00 PM

## 2021-05-27 NOTE — Progress Notes (Addendum)
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 Pharmacy has been consulted for electrolyte management.   Goal of Therapy:  Electrolytes WNL  Plan:  - Electrolytes WNL. No replacement indicated at this time - Recheck electrolytes with AM labs  Sherilyn Banker, PharmD Clinical Pharmacist 05/27/2021 7:13 AM

## 2021-05-27 NOTE — Progress Notes (Signed)
[facility]  Daily Progress Note   Subjective  -   Doing well.  No signs of withdrawal after initiation of alcohol at the end of the week.  Leg is doing okay  Objective Vitals:   05/26/21 2024 05/27/21 0437 05/27/21 0842 05/27/21 1143  BP: (!) 158/77 (!) 119/56 123/69 124/65  Pulse: 77 80 75 72  Resp: '16 16 18 20  '$ Temp: 98.1 F (36.7 C) 97.6 F (36.4 C) 98.1 F (36.7 C) 97.8 F (36.6 C)  TempSrc: Oral Oral Oral Oral  SpO2: 99% 97% 99%   Weight:      Height:        Intake/Output Summary (Last 24 hours) at 05/27/2021 1509 Last data filed at 05/27/2021 1417 Gross per 24 hour  Intake 1260 ml  Output --  Net 1260 ml    PULM  CTAB CV  RRR VASC  Feet warm, 1+ pedal pulse, 1-2+ RLE edema  Laboratory CBC    Component Value Date/Time   WBC 9.7 05/25/2021 0459   HGB 8.2 (L) 05/25/2021 0459   HCT 25.1 (L) 05/25/2021 0459   PLT 304 05/25/2021 0459    BMET    Component Value Date/Time   NA 136 05/27/2021 0505   K 4.2 05/27/2021 0505   CL 108 05/27/2021 0505   CO2 24 05/27/2021 0505   GLUCOSE 116 (H) 05/27/2021 0505   BUN 15 05/27/2021 0505   CREATININE 1.28 (H) 05/27/2021 0505   CALCIUM 8.7 (L) 05/27/2021 0505   GFRNONAA 57 (L) 05/27/2021 0505    Assessment/Planning: POD #5 s/p right femoral endarterectomy, bilateral iliac stents, right SFA stent  Overall doing well Awaiting placement for rehab Continue alcohol to avoid withdrawal   [name]  05/27/2021, 3:09 PM

## 2021-05-27 NOTE — TOC Progression Note (Addendum)
Transition of Care [facility]) - Progression Note    Patient Details  Name: Keith Levy MRN: [id number] Date of Birth: Dec 14, 1940  Transition of [facility]) CM/SW [place], LCSW Phone Number: 05/27/2021, 9:31 AM  Clinical Narrative:   Received call from daughter. Provided current bed offers: [facility]. She will review scores on CMS website and see if she has any other preferences. Uploaded requested documents into [place] [facility] for PASARR review.  10:02 am: Looking for placement in [facility] area. Daughter lives in [place]. Left voicemails at [facility], [facility], [facility], and [place] [facility].  1:42 pm: No call back from [facility] SNF's yet. Daughter will have her sister review list of SNF's that have not responded to referral on hub to see if they have any preferences out of those. [facility] offered a bed but daughter concerned about 1 out of 5 stars. PASARR still under review.  3:38 pm: No call back from [facility] SNF's yet. Left voicemail for [facility]. Faxed referral to [facility] in [place].  5:33 pm: [facility] only has beds for their residents. Daughter aware.  Expected Discharge Plan: [place] Barriers to Discharge: Continued Medical Work up  Expected Discharge Plan and Services Expected Discharge Plan: [place] arrangements for the past 2 months: Single Family Home                                       Social Determinants of Health (SDOH) Interventions    Readmission Risk Interventions No flowsheet data found.

## 2021-05-28 ENCOUNTER — Other Ambulatory Visit (INDEPENDENT_AMBULATORY_CARE_PROVIDER_SITE_OTHER): Payer: Self-pay | Admitting: Vascular Surgery

## 2021-05-28 DIAGNOSIS — I70223 Atherosclerosis of native arteries of extremities with rest pain, bilateral legs: Secondary | ICD-10-CM

## 2021-05-28 LAB — BASIC METABOLIC PANEL
Anion gap: 4 — ABNORMAL LOW (ref 5–15)
BUN: 18 mg/dL (ref 8–23)
CO2: 24 mmol/L (ref 22–32)
Calcium: 9.2 mg/dL (ref 8.9–10.3)
Chloride: 108 mmol/L (ref 98–111)
Creatinine, Ser: 1.28 mg/dL — ABNORMAL HIGH (ref 0.61–1.24)
GFR, Estimated: 57 mL/min — ABNORMAL LOW (ref >60)
Glucose, Bld: 121 mg/dL — ABNORMAL HIGH (ref 70–99)
Potassium: 4.1 mmol/L (ref 3.5–5.1)
Sodium: 136 mmol/L (ref 135–145)

## 2021-05-28 LAB — PHOSPHORUS: Phosphorus: 2.5 mg/dL (ref 2.5–4.6)

## 2021-05-28 LAB — MAGNESIUM: Magnesium: 2.4 mg/dL (ref 1.7–2.4)

## 2021-05-28 MED ORDER — OXYCODONE-ACETAMINOPHEN 5-325 MG PO TABS: 1.0000 | ORAL_TABLET | ORAL | 0 refills | Status: DC | PRN

## 2021-05-28 MED ORDER — ADULT MULTIVITAMIN W/MINERALS CH: 1.0000 | ORAL_TABLET | Freq: Every day | ORAL | Status: DC

## 2021-05-28 MED ORDER — ENSURE ENLIVE PO LIQD: 237.0000 mL | Freq: Three times a day (TID) | ORAL | 12 refills | Status: DC

## 2021-05-28 MED ORDER — LORAZEPAM 1 MG PO TABS
1.0000 mg | ORAL_TABLET | ORAL | Status: DC | PRN
  Administered 2021-05-28: 2 mg via ORAL
  Filled 2021-05-28: qty 2

## 2021-05-28 MED ORDER — APIXABAN 5 MG PO TABS: 5.0000 mg | ORAL_TABLET | Freq: Two times a day (BID) | ORAL | 5 refills | Status: DC

## 2021-05-28 MED ORDER — LORAZEPAM 2 MG/ML IJ SOLN: 1.0000 mg | INTRAMUSCULAR | Status: DC | PRN

## 2021-05-28 NOTE — Discharge Summary (Addendum)
[facility]    Discharge Summary  Patient ID:  Keith Levy MRN: [id number] DOB/AGE: [birth year] 80 y.o.  Admit date: 05/22/2021 Discharge date: 05/29/2021 Date of Surgery: 05/22/2021 Surgeon: Surgeon(s): Dew, Erskine Squibb, MD Schnier, Dolores Lory, MD  Admission Diagnosis: Atherosclerosis of artery of extremity with rest pain [facility]) [I70.229]  Discharge Diagnoses:  Atherosclerosis of artery of extremity with rest pain [facility]) [I70.229]  Secondary Diagnoses: Past Medical History:  Diagnosis Date   Bipolar disorder ([place])    CKD (chronic kidney disease), stage III ([place])    Hyperlipidemia    PAD (peripheral artery disease) ([place])    Pre-diabetes    Procedure(s): 05/22/21: 1.   Right common femoral, profunda femoris, and superficial femoral artery endarterectomies and patch angioplasty 2.   Catheter placement to the aorta from bilateral femoral approaches 3.   Aortogram and iliofemoral angiogram bilateral 4.  Kissing balloon stent placement to bilateral common iliac arteries with 8 mm diameter by 58 mm length lifestream stents. 5.  Additional stent placement to left common iliac artery with 9 mm diameter by 37 mm length lifestream stent 6.  Additional stent placement to the right external iliac artery with 8 mm diameter by 10 cm length Viabahn stent and then additional stent in the distal common iliac artery and proximal external iliac artery with a 9 mm diameter by 58 mm length lifestream stent 7.  Right lower extremity angiogram 8.  Stent placement x2 to the right SFA and popliteal with a 6 mm diameter by 25 cm length Viabahn stent and an 8 mm diameter by 25 cm length Viabahn stent 9.  Mechanical thrombectomy of the right common and external iliac arteries with the Rota Rex 8 device  Discharged Condition: Good  HPI / Hospital Course:  Patient presents with ischemic symptoms of both lower extremities worse on the left.  He had a recent angiogram and was found to  have occlusion of his right iliac stents, right SFA stents, and common femoral disease on the right as well as recurrent stenosis of the left iliac artery.  Right femoral endarterectomy as well as extensive endoluminal intervention is planned to try to improve perfusion.  The risks and benefits as well as alternative therapies including intervention were reviewed in detail all questions were answered the patient agrees to proceed with surgery. On 05/22/21, the patient underwent:  1.   Right common femoral, profunda femoris, and superficial femoral artery endarterectomies and patch angioplasty 2.   Catheter placement to the aorta from bilateral femoral approaches 3.   Aortogram and iliofemoral angiogram bilateral 4.  Kissing balloon stent placement to bilateral common iliac arteries with 8 mm diameter by 58 mm length lifestream stents. 5.  Additional stent placement to left common iliac artery with 9 mm diameter by 37 mm length lifestream stent 6.  Additional stent placement to the right external iliac artery with 8 mm diameter by 10 cm length Viabahn stent and then additional stent in the distal common iliac artery and proximal external iliac artery with a 9 mm diameter by 58 mm length lifestream stent 7.  Right lower extremity angiogram 8.  Stent placement x2 to the right SFA and popliteal with a 6 mm diameter by 25 cm length Viabahn stent and an 8 mm diameter by 25 cm length Viabahn stent 9.  Mechanical thrombectomy of the right common and external iliac arteries with the Rota Rex 8 device  The patient tolerated the procedure was transferred from recovery room to  the ICU.  Patient's night of surgery was unremarkable with the exception of atrial fibrillation for which cardiology was consulted and followed the patient while inpatient.  The patient has a past medical history significant for alcohol abuse and to keep him from experiencing DTs the patient was given a beer with each meal.  The patient still  required the use of benzodiazepines as well as a sitter for his safety.  During his inpatient stay, his diet was advanced, he was urinating independently, his pain was controlled to the use of p.o. pain medication and he was receiving PT/OT to assist with ambulation issues.  Day of discharge, the patient was afebrile with stable vital signs.  Physical Exam:  NAD Cardiovascular: Irregularly irregular Pulmonary: Clear to station bilaterally Abdomen: Soft, non-tender, non-distended Right groin:  Incision is clean dry and intact Right lower extremity: Thigh soft.  Calf soft.  Extremities warm distally toes.  Good capillary refill.  Labs: As below  Complications: None  Consults:  Cardiology  Significant Diagnostic Studies: CBC Lab Results  Component Value Date   WBC 8.0 05/29/2021   HGB 8.9 (L) 05/29/2021   HCT 26.2 (L) 05/29/2021   MCV 84.8 05/29/2021   PLT 382 05/29/2021   BMET    Component Value Date/Time   NA 136 05/29/2021 0410   K 3.9 05/29/2021 0410   CL 108 05/29/2021 0410   CO2 23 05/29/2021 0410   GLUCOSE 126 (H) 05/29/2021 0410   BUN 21 05/29/2021 0410   CREATININE 1.32 (H) 05/29/2021 0410   CALCIUM 9.3 05/29/2021 0410   GFRNONAA 55 (L) 05/29/2021 0410   COAG No results found for: INR, PROTIME  Disposition:  Discharge to :Skilled nursing facility  Allergies as of 05/29/2021   No Known Allergies      Medication List     STOP taking these medications    clopidogrel 75 MG tablet Commonly known as: Plavix       TAKE these medications    apixaban 5 MG Tabs tablet Commonly known as: ELIQUIS Take 1 tablet (5 mg total) by mouth 2 (two) times daily.   aspirin 81 MG EC tablet Take 81 mg by mouth daily.   feeding supplement Liqd Take 237 mLs by mouth 3 (three) times daily between meals.   furosemide 20 MG tablet Commonly known as: LASIX Take 20 mg by mouth daily.   lithium carbonate 300 MG capsule Take 300 mg by mouth 2 (two) times daily.    multivitamin with minerals Tabs tablet Take 1 tablet by mouth daily.   oxyCODONE-acetaminophen 5-325 MG tablet Commonly known as: PERCOCET/ROXICET Take 1-2 tablets by mouth every 4 (four) hours as needed for moderate pain.   PARoxetine 20 MG tablet Commonly known as: PAXIL Take 20 mg by mouth daily.   QUEtiapine 50 MG tablet Commonly known as: SEROQUEL Take 50 mg by mouth at bedtime.   QUEtiapine 200 MG tablet Commonly known as: SEROQUEL Take 200 mg by mouth at bedtime.   simvastatin 20 MG tablet Commonly known as: ZOCOR Take 20 mg by mouth daily.       Verbal and written Discharge instructions given to the patient. Wound care per Discharge AVS  Contact information for follow-up providers     Kris Hartmann, NP Follow up in 2 week(s).   Specialty: Vascular Surgery Why: First post-op incision check. No studies needed. Contact information: [place] [address] [phone]  Contact information for after-discharge care     Destination     [facility] Preferred SNF .   Service: Skilled Nursing Contact information: [address] [place] [phone]                    Signed: Sela Hua, PA-C 05/29/2021, 11:40 AM

## 2021-05-28 NOTE — TOC Progression Note (Addendum)
Transition of Care [facility]) - Progression Note    Patient Details  Name: Keith Levy MRN: [id number] Date of Birth: 06-Mar-1941  Transition of Care [facility]) CM/SW [place], LCSW Phone Number: 05/28/2021, 9:23 AM  Clinical Narrative:  PASARR obtained: [id number] E. Expires 10/12. No updated bed offers yet. Updated daughter. Will call [facility] SNF's later this morning to follow up.  12:54 pm: [facility] and [facility] do not have beds. Left voicemail for admissions coordinator at [facility] Faxed referral to [facility].  3:03 pm: [facility] declined.  Expected Discharge Plan: [place] Barriers to Discharge: Continued Medical Work up  Expected Discharge Plan and Services Expected Discharge Plan: [place] arrangements for the past 2 months: Single Family Home                                       Social Determinants of Health (SDOH) Interventions    Readmission Risk Interventions No flowsheet data found.

## 2021-05-28 NOTE — Progress Notes (Signed)
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 Pharmacy has been consulted for electrolyte management.   Goal of Therapy:  Electrolytes WNL  Plan:  - Electrolytes WNL for three days with no indication for replacement. Pharmacy will sign off at this time. Please re-consult as needed.  Sherilyn Banker, PharmD Clinical Pharmacist 05/28/2021 7:20 AM

## 2021-05-28 NOTE — Progress Notes (Signed)
Pt's daughter, [name], called this morning for an update, daughter expressed concerns about the beer order. Per daughter, "there is no need for beer" since her father's last drink was 1.5 weeks ago. Daughter was educated over the phone. MD and pharmacy made aware.

## 2021-05-28 NOTE — Progress Notes (Signed)
[facility]  Daily Progress Note   Subjective  -   Doing well. Eating. Pain control adequate.  Needs to walk more  Objective Vitals:   05/27/21 1531 05/27/21 2152 05/28/21 0530 05/28/21 0742  BP: 132/66 138/73 125/72 115/67  Pulse: 72 80 82 65  Resp: 18 (!) 23 16   Temp: 98.6 F (37 C) 98.9 F (37.2 C) 98.1 F (36.7 C) 97.9 F (36.6 C)  TempSrc:  Oral  Oral  SpO2: 100% 99% 95% 99%  Weight:      Height:        Intake/Output Summary (Last 24 hours) at 05/28/2021 0836 Last data filed at 05/28/2021 0527 Gross per 24 hour  Intake 360 ml  Output 2400 ml  Net -2040 ml    PULM  CTAB CV  RRR VASC  Foot warm, 2+ edema, incision is C/D/I  Laboratory CBC    Component Value Date/Time   WBC 9.7 05/25/2021 0459   HGB 8.2 (L) 05/25/2021 0459   HCT 25.1 (L) 05/25/2021 0459   PLT 304 05/25/2021 0459    BMET    Component Value Date/Time   NA 136 05/28/2021 0430   K 4.1 05/28/2021 0430   CL 108 05/28/2021 0430   CO2 24 05/28/2021 0430   GLUCOSE 121 (H) 05/28/2021 0430   BUN 18 05/28/2021 0430   CREATININE 1.28 (H) 05/28/2021 0430   CALCIUM 9.2 05/28/2021 0430   GFRNONAA 57 (L) 05/28/2021 0430    Assessment/Planning: POD #6 s/p Right femoral endarterectomy, Bilateral iliac stents, right SFA intervention  Doing well Eating good Pain better Swelling stable Work with PT/OT  Discharge planning for rehab   [name]  05/28/2021, 8:36 AM

## 2021-05-29 LAB — CBC
HCT: 26.2 % — ABNORMAL LOW (ref 39.0–52.0)
Hemoglobin: 8.9 g/dL — ABNORMAL LOW (ref 13.0–17.0)
MCH: 28.8 pg (ref 26.0–34.0)
MCHC: 34 g/dL (ref 30.0–36.0)
MCV: 84.8 fL (ref 80.0–100.0)
Platelets: 382 10*3/uL (ref 150–400)
RBC: 3.09 MIL/uL — ABNORMAL LOW (ref 4.22–5.81)
RDW: 15.9 % — ABNORMAL HIGH (ref 11.5–15.5)
WBC: 8 10*3/uL (ref 4.0–10.5)
nRBC: 0 % (ref 0.0–0.2)

## 2021-05-29 LAB — BASIC METABOLIC PANEL
Anion gap: 5 (ref 5–15)
BUN: 21 mg/dL (ref 8–23)
CO2: 23 mmol/L (ref 22–32)
Calcium: 9.3 mg/dL (ref 8.9–10.3)
Chloride: 108 mmol/L (ref 98–111)
Creatinine, Ser: 1.32 mg/dL — ABNORMAL HIGH (ref 0.61–1.24)
GFR, Estimated: 55 mL/min — ABNORMAL LOW (ref >60)
Glucose, Bld: 126 mg/dL — ABNORMAL HIGH (ref 70–99)
Potassium: 3.9 mmol/L (ref 3.5–5.1)
Sodium: 136 mmol/L (ref 135–145)

## 2021-05-29 LAB — MAGNESIUM: Magnesium: 2.5 mg/dL — ABNORMAL HIGH (ref 1.7–2.4)

## 2021-05-29 LAB — RESP PANEL BY RT-PCR (FLU A&B, COVID) ARPGX2
Influenza A by PCR: NEGATIVE
Influenza B by PCR: NEGATIVE
SARS Coronavirus 2 by RT PCR: NEGATIVE

## 2021-05-29 MED ORDER — OXYCODONE-ACETAMINOPHEN 5-325 MG PO TABS: 1.0000 | ORAL_TABLET | ORAL | 0 refills | Status: DC | PRN

## 2021-05-29 NOTE — Progress Notes (Signed)
Pt. Has orders for TELE and continuous O2 but keeps pulling lines off.

## 2021-05-29 NOTE — TOC Progression Note (Addendum)
Transition of Care [facility]) - Progression Note    Patient Details  Name: Keith Levy MRN: [id number] Date of Birth: 11-09-1940  [facility]) CM/SW Badin, LCSW Phone Number: 05/29/2021, 10:20 AM  Clinical Narrative: Still no response from [facility] or [place]. Will move forward with [facility]. Admissions coordinator will update later as to whether they have a bed today or not. Uploaded clinicals into [facility] portal to start insurance authorization.    10:39 am: [employer] authorization approved. Auth number has not generated yet.   11:55 am: Authorization number obtained: [id number]. [facility] can accept him today. Patient and daughters are aware and agreeable. Vascular PA ordered rapid COVID test.  Expected Discharge Plan: [place] Barriers to Discharge: Continued Medical Work up  Expected Discharge Plan and Services Expected Discharge Plan: [place] arrangements for the past 2 months: Single Family Home                                       Social Determinants of Health (SDOH) Interventions    Readmission Risk Interventions No flowsheet data found.

## 2021-05-29 NOTE — TOC Transition Note (Signed)
Transition of Care [facility]) - CM/SW Discharge Note   Patient Details  Name: Keith Levy MRN: [id number] Date of Birth: August 02, 1941  Transition [facility] Care [facility]) CM/SW Contact:  Candie Chroman, LCSW Phone Number: 05/29/2021, 1:22 PM   Clinical Narrative:   Patient has orders to discharge to [facility] today. RN will call report to [phone] (Room 303 on C Wing). EMS transport arranged for 3:00. No further concerns. CSW signing off.  Final next level of care: Skilled Nursing Facility Barriers to Discharge: Barriers Resolved   Patient Goals and CMS Choice Patient states their goals for this hospitalization and ongoing recovery are:: Spoke with Daughter Erling Conte [phone]   Choice offered to / list presented to : Patient, Adult Children  Discharge Placement PASRR number recieved: 05/28/21            Patient chooses bed at: [facility] Patient to be transferred to facility by: EMS Name of family member notified: Erling Conte and Candy Patient and family notified of of transfer: 05/29/21  Discharge Plan and Services                                     Social Determinants of Health (SDOH) Interventions     Readmission Risk Interventions No flowsheet data found.

## 2021-06-14 ENCOUNTER — Telehealth (INDEPENDENT_AMBULATORY_CARE_PROVIDER_SITE_OTHER): Payer: Self-pay | Admitting: Nurse Practitioner

## 2021-06-14 NOTE — Telephone Encounter (Signed)
Patient will need to come in but we can reschedule his appointment to later date

## 2021-06-14 NOTE — Telephone Encounter (Signed)
Pt is supposed to have an app on 06/17/2021 with Arna Medici. Pt states that he is in the nursing home and will be unable to make the app. Please advise patient if app is needed.

## 2021-06-17 ENCOUNTER — Encounter (INDEPENDENT_AMBULATORY_CARE_PROVIDER_SITE_OTHER): Payer: Medicare Other | Admitting: Nurse Practitioner

## 2021-06-17 ENCOUNTER — Other Ambulatory Visit: Payer: Self-pay

## 2021-06-17 ENCOUNTER — Ambulatory Visit (INDEPENDENT_AMBULATORY_CARE_PROVIDER_SITE_OTHER): Payer: Medicare Other | Admitting: Nurse Practitioner

## 2021-06-17 ENCOUNTER — Encounter (INDEPENDENT_AMBULATORY_CARE_PROVIDER_SITE_OTHER): Payer: Self-pay | Admitting: Nurse Practitioner

## 2021-06-17 VITALS — BP 118/66 | HR 82 | Resp 16 | Wt 175.0 lb

## 2021-06-17 DIAGNOSIS — I70213 Atherosclerosis of native arteries of extremities with intermittent claudication, bilateral legs: Secondary | ICD-10-CM

## 2021-06-23 ENCOUNTER — Encounter (INDEPENDENT_AMBULATORY_CARE_PROVIDER_SITE_OTHER): Payer: Self-pay | Admitting: Nurse Practitioner

## 2021-06-23 NOTE — Progress Notes (Signed)
Subjective:    Patient ID: Keith Levy, male    DOB: [birth year], 80 y.o.   MRN: [id number] Chief Complaint  Patient presents with   Routine Post Op    [facility] 2 wk post femoral endarterectomy,insertion of popliteal stent    Keith Levy is a 80 year old male that underwent intervention on 05/23/2021 including:  PROCEDURE: 1. Right common femoral, superficial femoral and profunda femoris endarterectomy with Cormatrix patch angioplasty. 2. Percutaneous transluminal angioplasty and stent placement left common iliac artery. 3. Open transluminal angioplasty and stent placement right common iliac artery with additional right external iliac artery stenting. 4. Rota Rex thrombectomy right external and common iliac arteries 5. Introduction catheter into the aorta right and left femoral approaches with percutaneous ultrasound-guided access to the left femoral artery. 6. Introduction catheter into the distal right lower extremity vasculature right femoral approach 7. Open angioplasty and stent placement right popliteal and superficial femoral arteries.   The patient to rehabilitation facility shortly after his surgery and take excellent care of his wound.  It is essentially healed at this time.  The patient is having some reperfusion swelling but overall he is doing well.  He was discharged home on the date of his office visit.   Review of Systems  Cardiovascular:  Positive for leg swelling.  Skin:  Positive for wound.  All other systems reviewed and are negative.     Objective:   Physical Exam Vitals reviewed.  HENT:     Head: Normocephalic.  Cardiovascular:     Rate and Rhythm: Normal rate.     Pulses: Normal pulses.  Pulmonary:     Effort: Pulmonary effort is normal.  Musculoskeletal:     Right lower leg: Edema present.  Skin:    General: Skin is warm and dry.  Neurological:     Mental Status: He is alert and oriented to person, place, and time.  Psychiatric:        Mood  and Affect: Mood normal.        Behavior: Behavior normal.        Thought Content: Thought content normal.        Judgment: Judgment normal.    BP 118/66 (BP Location: Right Arm)   Pulse 82   Resp 16   Wt 175 lb (79.4 kg)   BMI 24.41 kg/m   Past Medical History:  Diagnosis Date   Bipolar disorder (HCC)    CKD (chronic kidney disease), stage III (HCC)    Hyperlipidemia    PAD (peripheral artery disease) (HCC)    Pre-diabetes     Social History   Socioeconomic History   Marital status: Married    Spouse name: Not on file   Number of children: Not on file   Years of education: Not on file   Highest education level: Not on file  Occupational History   Not on file  Tobacco Use   Smoking status: Former    Packs/day: 1.00    Types: Cigarettes    Quit date: 05/12/2021    Years since quitting: 0.1   Smokeless tobacco: Never  Vaping Use   Vaping Use: Never used  Substance and Sexual Activity   Alcohol use: Yes    Alcohol/week: 8.0 standard drinks    Types: 6 Cans of beer, 2 Shots of liquor per week    Comment: 6 pack a week and couple mixed drinks/week   Drug use: Never   Sexual activity: Not on file  Other Topics Concern  Not on file  Social History Narrative   Not on file   Social Determinants of Health   Financial Resource Strain: Not on file  Food Insecurity: Not on file  Transportation Needs: Not on file  Physical Activity: Not on file  Stress: Not on file  Social Connections: Not on file  Intimate Partner Violence: Not on file    Past Surgical History:  Procedure Laterality Date   ENDARTERECTOMY FEMORAL Right 05/22/2021   Procedure: RIGHT ENDARTERECTOMY FEMORAL; INSERTION OF SFA AND POPLITEAL STENTS, THROMBECTOMY ILIAC;  Surgeon: Algernon Huxley, MD;  Location: [facility] ORS;  Service: Vascular;  Laterality: Right;   INSERTION OF ILIAC STENT Bilateral 05/22/2021   Procedure: INSERTION OF ILIAC STENT, BILATERAL;  Surgeon: Algernon Huxley, MD;  Location: [facility] ORS;   Service: Vascular;  Laterality: Bilateral;   LOWER EXTREMITY ANGIOGRAPHY Right 10/22/2020   Procedure: LOWER EXTREMITY ANGIOGRAPHY;  Surgeon: Algernon Huxley, MD;  Location: [place] [facility];  Service: Cardiovascular;  Laterality: Right;   LOWER EXTREMITY ANGIOGRAPHY Left 11/01/2020   Procedure: LOWER EXTREMITY ANGIOGRAPHY;  Surgeon: Algernon Huxley, MD;  Location: [place] [facility];  Service: Cardiovascular;  Laterality: Left;   LOWER EXTREMITY ANGIOGRAPHY Right 12/06/2020   Procedure: LOWER EXTREMITY ANGIOGRAPHY;  Surgeon: Algernon Huxley, MD;  Location: [place] [facility];  Service: Cardiovascular;  Laterality: Right;   LOWER EXTREMITY ANGIOGRAPHY Right 05/16/2021   Procedure: LOWER EXTREMITY ANGIOGRAPHY;  Surgeon: Algernon Huxley, MD;  Location: [place] [facility];  Service: Cardiovascular;  Laterality: Right;   VASCULAR SURGERY     stents on LLE and RLE    History reviewed. No pertinent family history.  No Known Allergies  CBC Latest Ref Rng & Units 05/29/2021 05/25/2021 05/23/2021  WBC 4.0 - 10.5 K/uL 8.0 9.7 9.0  Hemoglobin 13.0 - 17.0 g/dL 8.9(L) 8.2(L) 7.8(L)  Hematocrit 39.0 - 52.0 % 26.2(L) 25.1(L) 24.0(L)  Platelets 150 - 400 K/uL 382 304 243      CMP     Component Value Date/Time   NA 136 05/29/2021 0410   K 3.9 05/29/2021 0410   CL 108 05/29/2021 0410   CO2 23 05/29/2021 0410   GLUCOSE 126 (H) 05/29/2021 0410   BUN 21 05/29/2021 0410   CREATININE 1.32 (H) 05/29/2021 0410   CALCIUM 9.3 05/29/2021 0410   GFRNONAA 55 (L) 05/29/2021 0410     VAS [place] ABI WITH/WO TBI  Result Date: 05/21/2021  LOWER EXTREMITY DOPPLER STUDY Patient Name:  Keith Levy  Date of Exam:   05/10/2021 Medical Rec #: [id number]         Accession #:    [id number] Date of Birth: [birth year]         Patient Gender: M Patient Age:   54 years Exam Location:  [facility] Procedure:      VAS [place] ABI WITH/WO TBI Referring Phys: Corene Cornea DEW  --------------------------------------------------------------------------------  Indications: Peripheral artery disease.  Vascular Interventions: 10/22/20: Bilateral EIA & right SFA stents;                         11/01/20: Right CIA/EIA stent with right SFA/popliteal                         thrombectomy/PTA/stent;                         12/06/20: Right proximal SFA to  AK popliteal stents x2;. Performing Technologist: Blondell Reveal RT, RDMS, RVT  Examination Guidelines: A complete evaluation includes at minimum, Doppler waveform signals and systolic blood pressure reading at the level of bilateral brachial, anterior tibial, and posterior tibial arteries, when vessel segments are accessible. Bilateral testing is considered an integral part of a complete examination. Photoelectric Plethysmograph (PPG) waveforms and toe systolic pressure readings are included as required and additional duplex testing as needed. Limited examinations for reoccurring indications may be performed as noted.  ABI Findings: +---------+------------------+-----+------------+--------------------------+ Right    Rt Pressure (mmHg)IndexWaveform    Comment                    +---------+------------------+-----+------------+--------------------------+ Brachial 137                                                           +---------+------------------+-----+------------+--------------------------+ ATA      18                0.13 monophasic  continuous flow            +---------+------------------+-----+------------+--------------------------+ PTA                             occluded    no flow detected by duplex +---------+------------------+-----+------------+--------------------------+ PERO                            not detectedno flow detected by duplex +---------+------------------+-----+------------+--------------------------+ Great Toe                       not detected                            +---------+------------------+-----+------------+--------------------------+ +---------+------------------+-----+----------+-------+ Left     Lt Pressure (mmHg)IndexWaveform  Comment +---------+------------------+-----+----------+-------+ Brachial 142                                      +---------+------------------+-----+----------+-------+ ATA      78                0.55 monophasic        +---------+------------------+-----+----------+-------+ PTA      83                0.58 monophasic        +---------+------------------+-----+----------+-------+ Great Toe48                0.34 Abnormal          +---------+------------------+-----+----------+-------+ +-------+-----------+-----------+------------+------------+ ABI/TBIToday's ABIToday's TBIPrevious ABIPrevious TBI +-------+-----------+-----------+------------+------------+ Right  0.13       NA         0.94        0.67         +-------+-----------+-----------+------------+------------+ Left   0.58       0.34       0.97        1.01         +-------+-----------+-----------+------------+------------+ Bilateral ABIs appear decreased compared to prior study on 01/04/21.  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 Left: Resting left ankle-brachial index indicates moderate left  lower extremity arterial disease. The left toe-brachial index is abnormal.  *See table(s) above for measurements and observations.  Electronically signed by [name] MD on 05/21/2021 at 9:15:14 AM.    Final        Assessment & Plan:   1. Atherosclerosis of native artery of both lower extremities with intermittent claudication ([place]) The patient's wounds are healing very well post endarterectomy.  He has some reperfusion swelling.  The patient is advised to walk daily and elevate his lower extremities when he is not active.  We will have him return to the office in 4 to 6 weeks for noninvasive studies. -  VAS [place] ABI WITH/WO TBI; Future - VAS US AORTA/IVC/ILIACS; Future - VAS [place] LOWER EXTREMITY ARTERIAL DUPLEX; Future   Current Outpatient Medications on File Prior to Visit  Medication Sig Dispense Refill   apixaban (ELIQUIS) 5 MG TABS tablet Take 1 tablet (5 mg total) by mouth 2 (two) times daily. 60 tablet 5   aspirin 81 MG EC tablet Take 81 mg by mouth daily.     feeding supplement (ENSURE ENLIVE / ENSURE PLUS) LIQD Take 237 mLs by mouth 3 (three) times daily between meals. 237 mL 12   furosemide (LASIX) 20 MG tablet Take 20 mg by mouth daily.     lithium carbonate 300 MG capsule Take 300 mg by mouth 2 (two) times daily.     Multiple Vitamin (MULTIVITAMIN WITH MINERALS) TABS tablet Take 1 tablet by mouth daily.     oxyCODONE-acetaminophen (PERCOCET/ROXICET) 5-325 MG tablet Take 1-2 tablets by mouth every 4 (four) hours as needed for moderate pain. 50 tablet 0   PARoxetine (PAXIL) 20 MG tablet Take 20 mg by mouth daily.     QUEtiapine (SEROQUEL) 200 MG tablet Take 200 mg by mouth at bedtime.     QUEtiapine (SEROQUEL) 50 MG tablet Take 50 mg by mouth at bedtime.     simvastatin (ZOCOR) 20 MG tablet Take 20 mg by mouth daily.     No current facility-administered medications on file prior to visit.    There are no Patient Instructions on file for this visit. Return in about 4 weeks (around 07/15/2021) for PAD; PT daughter prefers Thursday/[place] appt/ See JD, FB.   Kris Hartmann, NP

## 2021-07-11 ENCOUNTER — Telehealth (INDEPENDENT_AMBULATORY_CARE_PROVIDER_SITE_OTHER): Payer: Self-pay

## 2021-07-11 NOTE — Telephone Encounter (Signed)
Patients daughter called in to get patient scheduled from 06/17/21 visit. Patient is scheduled for 08/02/21. Patients daughter is upset and demanding that because we were supposed to call her after they left with an appointment it is unacceptable and on our office that we get the patient worked in next week when he due to come back. I gave her an appointment option with NP Eulogio Ditch and she declined     Just FYI and please advise

## 2021-07-19 ENCOUNTER — Ambulatory Visit (INDEPENDENT_AMBULATORY_CARE_PROVIDER_SITE_OTHER): Payer: Medicare Other | Admitting: Vascular Surgery

## 2021-07-19 ENCOUNTER — Ambulatory Visit (INDEPENDENT_AMBULATORY_CARE_PROVIDER_SITE_OTHER): Payer: Medicare Other

## 2021-07-19 ENCOUNTER — Encounter (INDEPENDENT_AMBULATORY_CARE_PROVIDER_SITE_OTHER): Payer: Self-pay | Admitting: Vascular Surgery

## 2021-07-19 ENCOUNTER — Other Ambulatory Visit: Payer: Self-pay

## 2021-07-19 VITALS — BP 149/74 | HR 64 | Ht 71.0 in | Wt 185.0 lb

## 2021-07-19 DIAGNOSIS — I70213 Atherosclerosis of native arteries of extremities with intermittent claudication, bilateral legs: Secondary | ICD-10-CM | POA: Diagnosis not present

## 2021-07-19 DIAGNOSIS — I70229 Atherosclerosis of native arteries of extremities with rest pain, unspecified extremity: Secondary | ICD-10-CM | POA: Diagnosis not present

## 2021-07-19 DIAGNOSIS — F17200 Nicotine dependence, unspecified, uncomplicated: Secondary | ICD-10-CM

## 2021-07-19 DIAGNOSIS — E782 Mixed hyperlipidemia: Secondary | ICD-10-CM

## 2021-07-19 DIAGNOSIS — E1151 Type 2 diabetes mellitus with diabetic peripheral angiopathy without gangrene: Secondary | ICD-10-CM

## 2021-07-19 NOTE — Assessment & Plan Note (Signed)

## 2021-07-19 NOTE — Assessment & Plan Note (Signed)
blood glucose control important in reducing the progression of atherosclerotic disease. Also, involved in wound healing. On appropriate medications.  

## 2021-07-19 NOTE — Assessment & Plan Note (Signed)
Symptoms markedly improved after revascularization on the right leg. ABIs today are 1.08 on the right and 0.64 on the left.  Iliac stents appear patent.  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  Unfortunately, the patient has resumed smoking.  We discussed with the patient if he continues to smoke his interventions will not be durable and at some point we will not be able to fix his arteries and he will lose 1 or both legs.  I am not sure how to put it much more bluntly and plainly than that.  His disease is extremely severe.  He voices his understanding.  He will return in 3 months with ABIs.

## 2021-07-19 NOTE — Progress Notes (Signed)
Patient ID: Keith Levy, male   DOB: 08-05-1941, 80 y.o.   MRN: [id number]  Chief Complaint  Patient presents with   Follow-up    Return in four weeks for PAD    HPI Keith Levy is a 80 y.o. male.  Patient returns in follow-up of his extensive peripheral vascular disease.  About 2 months ago, he underwent extensive revascularization in both iliac arteries and the majority of the right lower extremity for limb salvage.  He had severe ischemic rest pain.  He had a somewhat prolonged hospital course complicated by alcohol withdrawal and poor preoperative conditioning but ultimately was discharged and has continued to do reasonably well.  He has some mild right leg swelling but the pain is tremendously improved.  He really is not having much in the way of claudication anymore at this point.  ABIs today are 1.08 on the right and 0.64 on the left.  Iliac stents appear patent.  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   Past Medical History:  Diagnosis Date   Bipolar disorder ([place])    CKD (chronic kidney disease), stage III (HCC)    Hyperlipidemia    PAD (peripheral artery disease) (HCC)    Pre-diabetes     Past Surgical History:  Procedure Laterality Date   ENDARTERECTOMY FEMORAL Right 05/22/2021   Procedure: RIGHT ENDARTERECTOMY FEMORAL; INSERTION OF SFA AND POPLITEAL STENTS, THROMBECTOMY ILIAC;  Surgeon: Algernon Huxley, MD;  Location: [facility] ORS;  Service: Vascular;  Laterality: Right;   INSERTION OF ILIAC STENT Bilateral 05/22/2021   Procedure: INSERTION OF ILIAC STENT, BILATERAL;  Surgeon: Algernon Huxley, MD;  Location: [facility] ORS;  Service: Vascular;  Laterality: Bilateral;   LOWER EXTREMITY ANGIOGRAPHY Right 10/22/2020   Procedure: LOWER EXTREMITY ANGIOGRAPHY;  Surgeon: Algernon Huxley, MD;  Location: [place] [facility];  Service: Cardiovascular;  Laterality: Right;   LOWER  EXTREMITY ANGIOGRAPHY Left 11/01/2020   Procedure: LOWER EXTREMITY ANGIOGRAPHY;  Surgeon: Algernon Huxley, MD;  Location: [place] [facility];  Service: Cardiovascular;  Laterality: Left;   LOWER EXTREMITY ANGIOGRAPHY Right 12/06/2020   Procedure: LOWER EXTREMITY ANGIOGRAPHY;  Surgeon: Algernon Huxley, MD;  Location: [place] [facility];  Service: Cardiovascular;  Laterality: Right;   LOWER EXTREMITY ANGIOGRAPHY Right 05/16/2021   Procedure: LOWER EXTREMITY ANGIOGRAPHY;  Surgeon: Algernon Huxley, MD;  Location: [place] [facility];  Service: Cardiovascular;  Laterality: Right;   VASCULAR SURGERY     stents on LLE and RLE      No Known Allergies  Current Outpatient Medications  Medication Sig Dispense Refill   amoxicillin-clavulanate (AUGMENTIN) 500-125 MG tablet Take 1 tablet by mouth 3 (three) times daily.     apixaban (ELIQUIS) 5 MG TABS tablet Take 1 tablet (5 mg total) by mouth 2 (two) times daily. 60 tablet 5   aspirin 81 MG EC tablet Take 81 mg by mouth daily.     feeding supplement (ENSURE ENLIVE / ENSURE PLUS) LIQD Take 237 mLs by mouth 3 (three) times daily between meals. 237 mL 12   furosemide (LASIX) 20 MG tablet Take 20 mg by mouth daily.     lithium carbonate 300 MG capsule Take 300 mg by mouth 2 (two) times daily.     Multiple Vitamin (MULTIVITAMIN WITH MINERALS) TABS tablet Take 1 tablet by mouth daily.     oxyCODONE-acetaminophen (PERCOCET/ROXICET) 5-325 MG tablet Take  1-2 tablets by mouth every 4 (four) hours as needed for moderate pain. 50 tablet 0   PARoxetine (PAXIL) 20 MG tablet Take 20 mg by mouth daily.     QUEtiapine (SEROQUEL) 200 MG tablet Take 200 mg by mouth at bedtime.     QUEtiapine (SEROQUEL) 50 MG tablet Take 50 mg by mouth at bedtime.     simvastatin (ZOCOR) 20 MG tablet Take 20 mg by mouth daily.     No current facility-administered medications for this visit.        Physical Exam BP (!) 149/74   Pulse 64   Ht 5\' 11"  (1.803 m)   Wt 185 lb (83.9 kg)    BMI 25.80 kg/m  Gen:  WD/WN, NAD Skin: incision C/D/I     Assessment/Plan:  Atherosclerosis of artery of extremity with rest pain (HCC) Symptoms markedly improved after revascularization on the right leg. ABIs today are 1.08 on the right and 0.64 on the left.  Iliac stents appear patent.  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  Unfortunately, the patient has resumed smoking.  We discussed with the patient if he continues to smoke his interventions will not be durable and at some point we will not be able to fix his arteries and he will lose 1 or both legs.  I am not sure how to put it much more bluntly and plainly than that.  His disease is extremely severe.  He voices his understanding.  He will return in 3 months with ABIs.  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 Also, involved in wound healing. On appropriate medications.   Combined hyperlipidemia lipid control important in reducing the progression of atherosclerotic disease. Continue statin therapy   Tobacco use disorder We had a discussion for approximately 3 minutes regarding the absolute need for smoking cessation due to the deleterious nature of tobacco on the vascular system. We discussed the tobacco use would diminish patency of any intervention, and likely significantly worsen progressio of disease. We discussed multiple agents for quitting including replacement therapy or medications to reduce cravings such as Chantix. The patient voices their understanding of the importance of smoking cessation.      [name] 07/19/2021, 10:25 AM   This note was created with Dragon medical transcription system.  Any errors from dictation are unintentional.

## 2021-07-19 NOTE — Assessment & Plan Note (Signed)
lipid control important in reducing the progression of atherosclerotic disease. Continue statin therapy  

## 2021-08-02 ENCOUNTER — Encounter (INDEPENDENT_AMBULATORY_CARE_PROVIDER_SITE_OTHER): Payer: Medicare Other

## 2021-08-02 ENCOUNTER — Ambulatory Visit (INDEPENDENT_AMBULATORY_CARE_PROVIDER_SITE_OTHER): Payer: Medicare Other | Admitting: Vascular Surgery

## 2021-10-29 ENCOUNTER — Encounter (INDEPENDENT_AMBULATORY_CARE_PROVIDER_SITE_OTHER): Payer: Self-pay | Admitting: Vascular Surgery

## 2021-10-29 ENCOUNTER — Other Ambulatory Visit: Payer: Self-pay

## 2021-10-29 ENCOUNTER — Ambulatory Visit (INDEPENDENT_AMBULATORY_CARE_PROVIDER_SITE_OTHER): Payer: Medicare Other

## 2021-10-29 ENCOUNTER — Ambulatory Visit (INDEPENDENT_AMBULATORY_CARE_PROVIDER_SITE_OTHER): Payer: Medicare Other | Admitting: Vascular Surgery

## 2021-10-29 VITALS — BP 168/81 | HR 66 | Ht 71.0 in | Wt 193.0 lb

## 2021-10-29 DIAGNOSIS — I70229 Atherosclerosis of native arteries of extremities with rest pain, unspecified extremity: Secondary | ICD-10-CM

## 2021-10-29 DIAGNOSIS — F17200 Nicotine dependence, unspecified, uncomplicated: Secondary | ICD-10-CM

## 2021-10-29 DIAGNOSIS — I70221 Atherosclerosis of native arteries of extremities with rest pain, right leg: Secondary | ICD-10-CM

## 2021-10-29 DIAGNOSIS — E782 Mixed hyperlipidemia: Secondary | ICD-10-CM

## 2021-10-29 DIAGNOSIS — E1151 Type 2 diabetes mellitus with diabetic peripheral angiopathy without gangrene: Secondary | ICD-10-CM | POA: Diagnosis not present

## 2021-10-29 NOTE — Progress Notes (Signed)
MRN : [id number]  Rita Prom is a 81 y.o. (Sep 28, 1940) male who presents with chief complaint of  Chief Complaint  Patient presents with   Follow-up    3 Mo ABI  .  History of Present Illness: Patient returns today in follow up of his PAD.  He has undergone multiple revascularization procedures with the most extensive being about 6 months ago which included femoral endarterectomies, iliac stents, and right infrainguinal intervention.  Since that time, he is done much better.  His true rest pain has resolved although he does still have neuropathic pain in both feet a little worse on the right than the left.  He has some claudication symptoms but he really does not walk all that much.  No open wounds or infection.  His swelling is stable. ABIs today are 1.08 on the right and 0.64 on the left which are stable from previous studies.   Current Outpatient Medications  Medication Sig Dispense Refill   amoxicillin-clavulanate (AUGMENTIN) 500-125 MG tablet Take 1 tablet by mouth 3 (three) times daily.     apixaban (ELIQUIS) 5 MG TABS tablet Take 1 tablet (5 mg total) by mouth 2 (two) times daily. 60 tablet 5   aspirin 81 MG EC tablet Take 81 mg by mouth daily.     clopidogrel (PLAVIX) 75 MG tablet Take 75 mg by mouth daily.     feeding supplement (ENSURE ENLIVE / ENSURE PLUS) LIQD Take 237 mLs by mouth 3 (three) times daily between meals. 237 mL 12   furosemide (LASIX) 20 MG tablet Take 20 mg by mouth daily.     lithium carbonate 300 MG capsule Take 300 mg by mouth 2 (two) times daily.     Multiple Vitamin (MULTIVITAMIN WITH MINERALS) TABS tablet Take 1 tablet by mouth daily.     oxyCODONE-acetaminophen (PERCOCET/ROXICET) 5-325 MG tablet Take 1-2 tablets by mouth every 4 (four) hours as needed for moderate pain. 50 tablet 0   PARoxetine (PAXIL) 20 MG tablet Take 20 mg by mouth daily.     QUEtiapine (SEROQUEL) 200 MG tablet Take 200 mg by mouth at bedtime.     QUEtiapine (SEROQUEL) 50 MG  tablet Take 50 mg by mouth at bedtime.     simvastatin (ZOCOR) 20 MG tablet Take 20 mg by mouth daily.     No current facility-administered medications for this visit.    Past Medical History:  Diagnosis Date   Bipolar disorder ([place])    CKD (chronic kidney disease), stage III (HCC)    Hyperlipidemia    PAD (peripheral artery disease) (HCC)    Pre-diabetes     Past Surgical History:  Procedure Laterality Date   ENDARTERECTOMY FEMORAL Right 05/22/2021   Procedure: RIGHT ENDARTERECTOMY FEMORAL; INSERTION OF SFA AND POPLITEAL STENTS, THROMBECTOMY ILIAC;  Surgeon: Algernon Huxley, MD;  Location: [facility];  Service: Vascular;  Laterality: Right;   INSERTION OF ILIAC STENT Bilateral 05/22/2021   Procedure: INSERTION OF ILIAC STENT, BILATERAL;  Surgeon: Algernon Huxley, MD;  Location: [facility];  Service: Vascular;  Laterality: Bilateral;   LOWER EXTREMITY ANGIOGRAPHY Right 10/22/2020   Procedure: LOWER EXTREMITY ANGIOGRAPHY;  Surgeon: Algernon Huxley, MD;  Location: [place] [facility];  Service: Cardiovascular;  Laterality: Right;   LOWER EXTREMITY ANGIOGRAPHY Left 11/01/2020   Procedure: LOWER EXTREMITY ANGIOGRAPHY;  Surgeon: Algernon Huxley, MD;  Location: [place] [facility];  Service: Cardiovascular;  Laterality: Left;   LOWER EXTREMITY ANGIOGRAPHY Right 12/06/2020   Procedure: LOWER  EXTREMITY ANGIOGRAPHY;  Surgeon: Algernon Huxley, MD;  Location: [place] [facility];  Service: Cardiovascular;  Laterality: Right;   LOWER EXTREMITY ANGIOGRAPHY Right 05/16/2021   Procedure: LOWER EXTREMITY ANGIOGRAPHY;  Surgeon: Algernon Huxley, MD;  Location: [place] [facility];  Service: Cardiovascular;  Laterality: Right;   VASCULAR SURGERY     stents on LLE and RLE     Social History   Tobacco Use   Smoking status: Former    Packs/day: 1.00    Types: Cigarettes    Quit date: 05/12/2021    Years since quitting: 0.4   Smokeless tobacco: Never  Vaping Use   Vaping Use: Never used  Substance Use Topics   Alcohol  use: Yes    Alcohol/week: 8.0 standard drinks    Types: 6 Cans of beer, 2 Shots of liquor per week    Comment: 6 pack a week and couple mixed drinks/week   Drug use: Never      No family history on file.   No Known Allergies   REVIEW OF SYSTEMS (Negative unless checked)  Constitutional: [] Weight loss  [] Fever  [] Chills Cardiac: [] Chest pain   [] Chest pressure   [] Palpitations   [] Shortness of breath when laying flat   [] Shortness of breath at rest   [] Shortness of breath with exertion. Vascular:  [x] Pain in legs with walking   [] Pain in legs at rest   [] Pain in legs when laying flat   [] Claudication   [] Pain in feet when walking  [] Pain in feet at rest  [] Pain in feet when laying flat   [] History of DVT   [] Phlebitis   [] Swelling in legs   [] Varicose veins   [] Non-healing ulcers Pulmonary:   [] Uses home oxygen   [] Productive cough   [] Hemoptysis   [] Wheeze  [] COPD   [] Asthma Neurologic:  [] Dizziness  [] Blackouts   [] Seizures   [] History of stroke   [] History of TIA  [] Aphasia   [] Temporary blindness   [] Dysphagia   [] Weakness or numbness in arms   [x] Weakness or numbness in legs Musculoskeletal:  [x] Arthritis   [] Joint swelling   [x] Joint pain   [] Low back pain Hematologic:  [] Easy bruising  [] Easy bleeding   [] Hypercoagulable state   [] Anemic   Gastrointestinal:  [] Blood in stool   [] Vomiting blood  [] Gastroesophageal reflux/heartburn   [] Abdominal pain Genitourinary:  [] Chronic kidney disease   [] Difficult urination  [] Frequent urination  [] Burning with urination   [] Hematuria Skin:  [] Rashes   [] Ulcers   [] Wounds Psychological:  [] History of anxiety   []  History of major depression.  Physical Examination  BP (!) 168/81    Pulse 66    Ht 5\' 11"  (1.803 m)    Wt 193 lb (87.5 kg)    BMI 26.92 kg/m  Gen:  WD/WN, NAD Head: [place]/AT, No temporalis wasting. Ear/Nose/Throat: Hearing grossly intact, nares w/o erythema or drainage Eyes: Conjunctiva clear. Sclera non-icteric Neck: Supple.   Trachea midline Pulmonary:  Good air movement, no use of accessory muscles.  Cardiac: RRR, no JVD Vascular:  Vessel Right Left  Radial Palpable Palpable                          PT 1+ Palpable 1+ Palpable  DP 1+ Palpable Trace Palpable   Gastrointestinal: soft, non-tender/non-distended. No guarding/reflex.  Musculoskeletal: M/S 5/5 throughout.  No deformity or atrophy. 1+ BLE edema. Neurologic: Sensation grossly intact in extremities.  Symmetrical.  Speech is fluent.  Psychiatric: Judgment intact, Mood & affect appropriate for pt's clinical situation. Dermatologic: No rashes or ulcers noted.  No cellulitis or open wounds.      Labs No results found for this or any previous visit (from the past 2160 hour(s)).  Radiology No results found.  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 Also, involved in wound healing. On appropriate medications.     Combined hyperlipidemia lipid control important in reducing the progression of atherosclerotic disease. Continue statin therapy  Tobacco use disorder We had a discussion for approximately 3 minutes regarding the absolute need for smoking cessation due to the deleterious nature of tobacco on the vascular system. We discussed the tobacco use would diminish patency of any intervention, and likely significantly worsen progressio of disease. We discussed multiple agents for quitting including replacement therapy or medications to reduce cravings such as Chantix. The patient voices their understanding of the importance of smoking cessation.   Atherosclerosis of native arteries of extremity with rest pain (HCC) ABIs today are 1.08 on the right and 0.64 on the left which are stable from previous studies.  His rest pain is resolved.  He does still have some neuropathy type pain, but this is markedly improved after his  revascularization.  Smoking cessation was again stressed.  Continue current medical regimen.  Recheck in 3 months.    [name], MD  10/29/2021 10:56 AM    This note was created with Dragon medical transcription system.  Any errors from dictation are purely unintentional

## 2021-10-29 NOTE — Assessment & Plan Note (Signed)
ABIs today are 1.08 on the right and 0.64 on the left which are stable from previous studies.  His rest pain is resolved.  He does still have some neuropathy type pain, but this is markedly improved after his revascularization.  Smoking cessation was again stressed.  Continue current medical regimen.  Recheck in 3 months.

## 2021-10-29 NOTE — Assessment & Plan Note (Signed)

## 2021-11-27 ENCOUNTER — Other Ambulatory Visit: Payer: Self-pay

## 2021-11-27 ENCOUNTER — Inpatient Hospital Stay: Payer: Medicare Other

## 2021-11-27 ENCOUNTER — Inpatient Hospital Stay
Admission: EM | Admit: 2021-11-27 | Discharge: 2021-12-03 | DRG: 064 | Disposition: A | Discharge status: Hospice | Payer: Medicare Other | Source: Ambulatory Visit | Attending: Internal Medicine | Admitting: Internal Medicine

## 2021-11-27 ENCOUNTER — Other Ambulatory Visit: Payer: Self-pay | Admitting: Internal Medicine

## 2021-11-27 ENCOUNTER — Ambulatory Visit
Admission: RE | Admit: 2021-11-27 | Discharge: 2021-11-27 | Disposition: A | Discharge status: Home / Self Care | Payer: Medicare Other | Source: Ambulatory Visit | Attending: Internal Medicine | Admitting: Internal Medicine

## 2021-11-27 DIAGNOSIS — J432 Centrilobular emphysema: Secondary | ICD-10-CM | POA: Diagnosis present

## 2021-11-27 DIAGNOSIS — Z7902 Long term (current) use of antithrombotics/antiplatelets: Secondary | ICD-10-CM

## 2021-11-27 DIAGNOSIS — S065XAA Traumatic subdural hemorrhage with loss of consciousness status unknown, initial encounter: Secondary | ICD-10-CM | POA: Insufficient documentation

## 2021-11-27 DIAGNOSIS — I482 Chronic atrial fibrillation, unspecified: Secondary | ICD-10-CM | POA: Diagnosis present

## 2021-11-27 DIAGNOSIS — R471 Dysarthria and anarthria: Secondary | ICD-10-CM | POA: Diagnosis present

## 2021-11-27 DIAGNOSIS — R296 Repeated falls: Secondary | ICD-10-CM | POA: Diagnosis present

## 2021-11-27 DIAGNOSIS — Z66 Do not resuscitate: Secondary | ICD-10-CM | POA: Diagnosis present

## 2021-11-27 DIAGNOSIS — F319 Bipolar disorder, unspecified: Secondary | ICD-10-CM | POA: Diagnosis present

## 2021-11-27 DIAGNOSIS — F17290 Nicotine dependence, other tobacco product, uncomplicated: Secondary | ICD-10-CM | POA: Diagnosis present

## 2021-11-27 DIAGNOSIS — F17200 Nicotine dependence, unspecified, uncomplicated: Secondary | ICD-10-CM | POA: Diagnosis present

## 2021-11-27 DIAGNOSIS — N1831 Chronic kidney disease, stage 3a: Secondary | ICD-10-CM | POA: Diagnosis present

## 2021-11-27 DIAGNOSIS — J9601 Acute respiratory failure with hypoxia: Secondary | ICD-10-CM | POA: Diagnosis present

## 2021-11-27 DIAGNOSIS — Z7901 Long term (current) use of anticoagulants: Secondary | ICD-10-CM

## 2021-11-27 DIAGNOSIS — R482 Apraxia: Secondary | ICD-10-CM | POA: Diagnosis present

## 2021-11-27 DIAGNOSIS — R29810 Facial weakness: Secondary | ICD-10-CM | POA: Diagnosis present

## 2021-11-27 DIAGNOSIS — I639 Cerebral infarction, unspecified: Secondary | ICD-10-CM | POA: Diagnosis present

## 2021-11-27 DIAGNOSIS — N183 Chronic kidney disease, stage 3 unspecified: Secondary | ICD-10-CM | POA: Diagnosis present

## 2021-11-27 DIAGNOSIS — E785 Hyperlipidemia, unspecified: Secondary | ICD-10-CM | POA: Diagnosis present

## 2021-11-27 DIAGNOSIS — F10231 Alcohol dependence with withdrawal delirium: Secondary | ICD-10-CM | POA: Diagnosis not present

## 2021-11-27 DIAGNOSIS — Z79899 Other long term (current) drug therapy: Secondary | ICD-10-CM

## 2021-11-27 DIAGNOSIS — R7303 Prediabetes: Secondary | ICD-10-CM | POA: Diagnosis present

## 2021-11-27 DIAGNOSIS — J441 Chronic obstructive pulmonary disease with (acute) exacerbation: Secondary | ICD-10-CM | POA: Diagnosis not present

## 2021-11-27 DIAGNOSIS — E440 Moderate protein-calorie malnutrition: Secondary | ICD-10-CM | POA: Diagnosis present

## 2021-11-27 DIAGNOSIS — I493 Ventricular premature depolarization: Secondary | ICD-10-CM | POA: Diagnosis present

## 2021-11-27 DIAGNOSIS — G9340 Encephalopathy, unspecified: Secondary | ICD-10-CM | POA: Diagnosis not present

## 2021-11-27 DIAGNOSIS — Z6824 Body mass index (BMI) 24.0-24.9, adult: Secondary | ICD-10-CM

## 2021-11-27 DIAGNOSIS — F1010 Alcohol abuse, uncomplicated: Secondary | ICD-10-CM | POA: Diagnosis not present

## 2021-11-27 DIAGNOSIS — R29700 NIHSS score 0: Secondary | ICD-10-CM | POA: Diagnosis present

## 2021-11-27 DIAGNOSIS — J690 Pneumonitis due to inhalation of food and vomit: Secondary | ICD-10-CM | POA: Diagnosis present

## 2021-11-27 DIAGNOSIS — I70229 Atherosclerosis of native arteries of extremities with rest pain, unspecified extremity: Secondary | ICD-10-CM | POA: Diagnosis present

## 2021-11-27 DIAGNOSIS — I129 Hypertensive chronic kidney disease with stage 1 through stage 4 chronic kidney disease, or unspecified chronic kidney disease: Secondary | ICD-10-CM | POA: Diagnosis present

## 2021-11-27 DIAGNOSIS — G928 Other toxic encephalopathy: Secondary | ICD-10-CM | POA: Diagnosis present

## 2021-11-27 DIAGNOSIS — I63411 Cerebral infarction due to embolism of right middle cerebral artery: Secondary | ICD-10-CM | POA: Diagnosis present

## 2021-11-27 DIAGNOSIS — D329 Benign neoplasm of meninges, unspecified: Secondary | ICD-10-CM | POA: Diagnosis present

## 2021-11-27 DIAGNOSIS — Z7189 Other specified counseling: Secondary | ICD-10-CM | POA: Diagnosis not present

## 2021-11-27 DIAGNOSIS — Z7982 Long term (current) use of aspirin: Secondary | ICD-10-CM

## 2021-11-27 DIAGNOSIS — I4891 Unspecified atrial fibrillation: Secondary | ICD-10-CM | POA: Diagnosis not present

## 2021-11-27 DIAGNOSIS — Z515 Encounter for palliative care: Secondary | ICD-10-CM | POA: Diagnosis not present

## 2021-11-27 DIAGNOSIS — R0902 Hypoxemia: Secondary | ICD-10-CM

## 2021-11-27 LAB — CBC
HCT: 42.8 % (ref 39.0–52.0)
Hemoglobin: 13.1 g/dL (ref 13.0–17.0)
MCH: 26 pg (ref 26.0–34.0)
MCHC: 30.6 g/dL (ref 30.0–36.0)
MCV: 85.1 fL (ref 80.0–100.0)
Platelets: 276 10*3/uL (ref 150–400)
RBC: 5.03 MIL/uL (ref 4.22–5.81)
RDW: 17.2 % — ABNORMAL HIGH (ref 11.5–15.5)
WBC: 8.3 10*3/uL (ref 4.0–10.5)
nRBC: 0 % (ref 0.0–0.2)

## 2021-11-27 LAB — DIFFERENTIAL
Abs Immature Granulocytes: 0.03 10*3/uL (ref 0.00–0.07)
Basophils Absolute: 0.1 10*3/uL (ref 0.0–0.1)
Basophils Relative: 1 %
Eosinophils Absolute: 0.2 10*3/uL (ref 0.0–0.5)
Eosinophils Relative: 2 %
Immature Granulocytes: 0 %
Lymphocytes Relative: 18 %
Lymphs Abs: 1.5 10*3/uL (ref 0.7–4.0)
Monocytes Absolute: 0.6 10*3/uL (ref 0.1–1.0)
Monocytes Relative: 8 %
Neutro Abs: 5.9 10*3/uL (ref 1.7–7.7)
Neutrophils Relative %: 71 %

## 2021-11-27 LAB — COMPREHENSIVE METABOLIC PANEL
ALT: 38 U/L (ref 0–44)
AST: 71 U/L — ABNORMAL HIGH (ref 15–41)
Albumin: 3.7 g/dL (ref 3.5–5.0)
Alkaline Phosphatase: 45 U/L (ref 38–126)
Anion gap: 9 (ref 5–15)
BUN: 23 mg/dL (ref 8–23)
CO2: 24 mmol/L (ref 22–32)
Calcium: 9.9 mg/dL (ref 8.9–10.3)
Chloride: 104 mmol/L (ref 98–111)
Creatinine, Ser: 1.39 mg/dL — ABNORMAL HIGH (ref 0.61–1.24)
GFR, Estimated: 52 mL/min — ABNORMAL LOW (ref >60)
Glucose, Bld: 105 mg/dL — ABNORMAL HIGH (ref 70–99)
Potassium: 3.5 mmol/L (ref 3.5–5.1)
Sodium: 137 mmol/L (ref 135–145)
Total Bilirubin: 1.7 mg/dL — ABNORMAL HIGH (ref 0.3–1.2)
Total Protein: 7.8 g/dL (ref 6.5–8.1)

## 2021-11-27 LAB — PROTIME-INR
INR: 1.1 (ref 0.8–1.2)
Prothrombin Time: 14.2 seconds (ref 11.4–15.2)

## 2021-11-27 LAB — APTT: aPTT: 31 seconds (ref 24–36)

## 2021-11-27 IMAGING — CT CT HEAD W/O CM
4 series · 16 of 47 positions shown, 18 images · non-contrast
Comparison: None.

CLINICAL DATA: Acute subdural hematoma.



[Series 2: head bone · axial · 0.45mm/px · z∈[-128,-94]mm · 3 of 85 slices shown]
[im 9/85  bone]
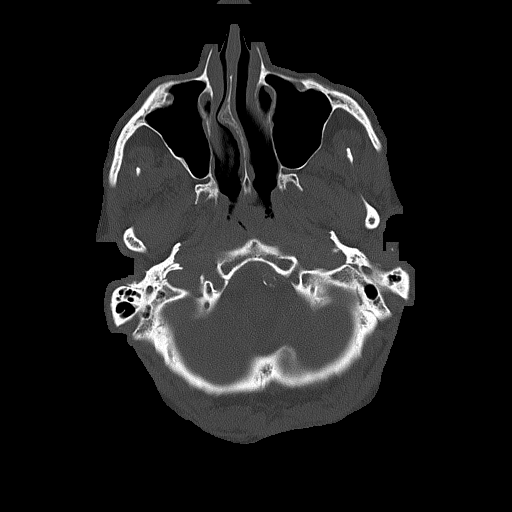
[im 17/85  bone]
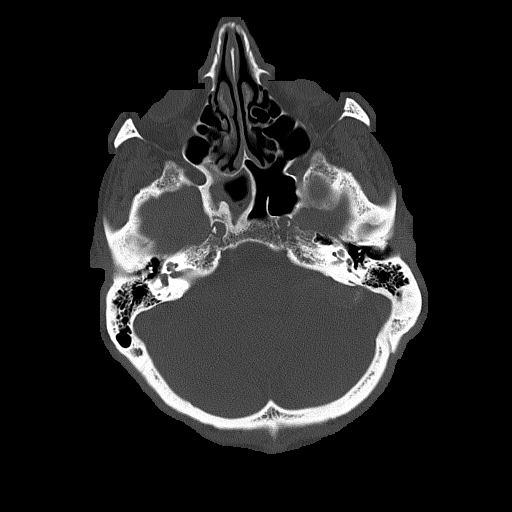
[im 26/85  bone]
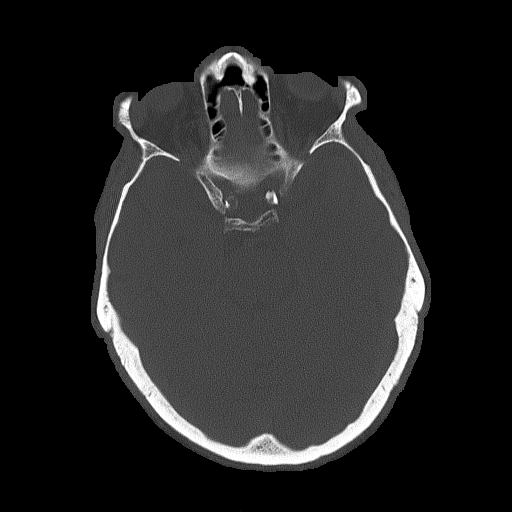

[Series 3: head wo · axial · 0.45mm/px · z∈[-124,-4]mm · 7 of 34 slices shown, 9 images]
[im 5/34  brain]
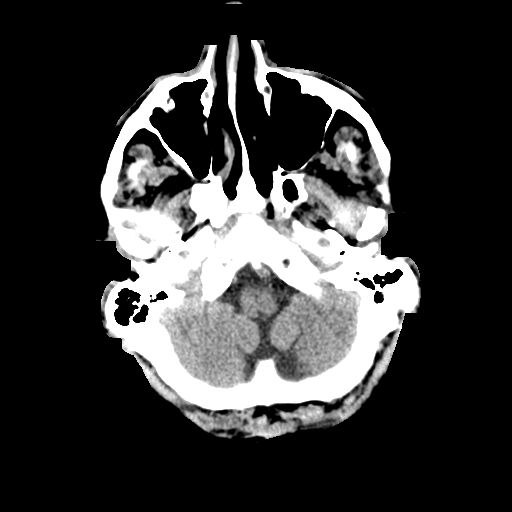
[im 5/34  bone]
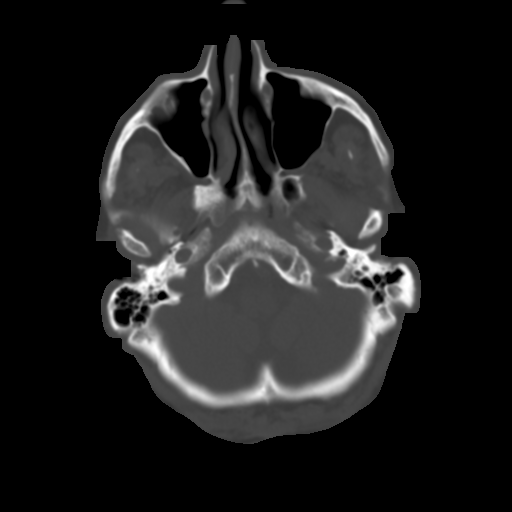
[im 9/34  brain]
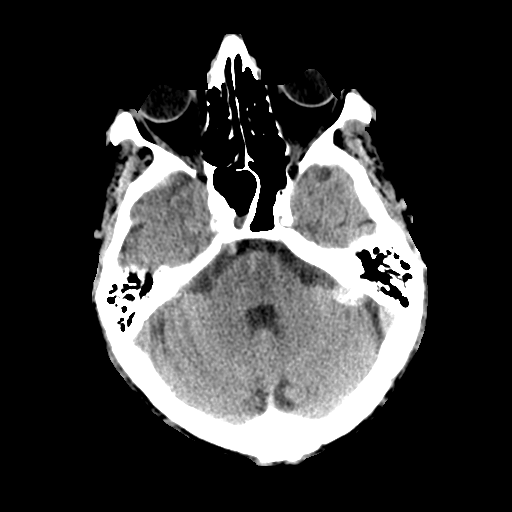
[im 13/34  brain]
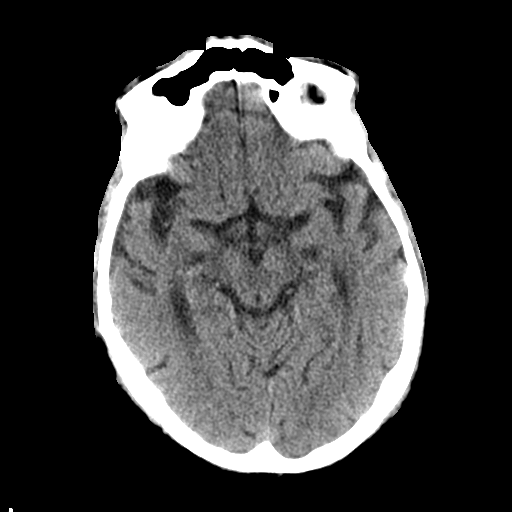
[im 17/34  brain]
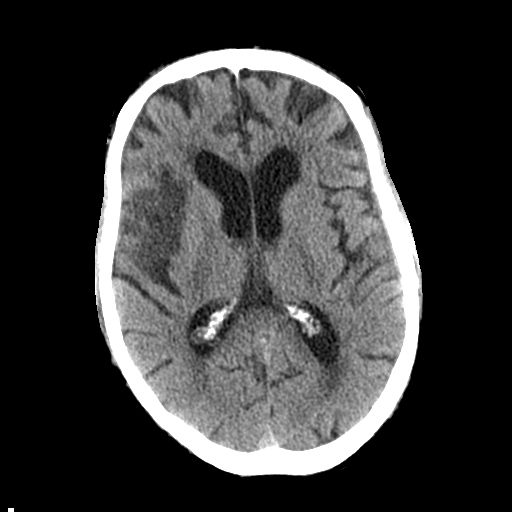
[im 21/34  brain]
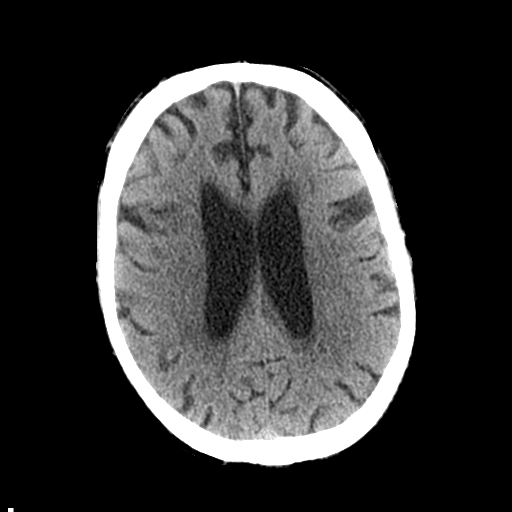
[im 21/34  bone]
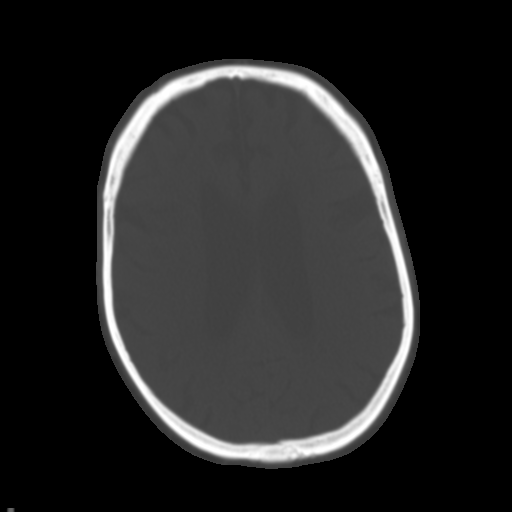
[im 25/34  brain]
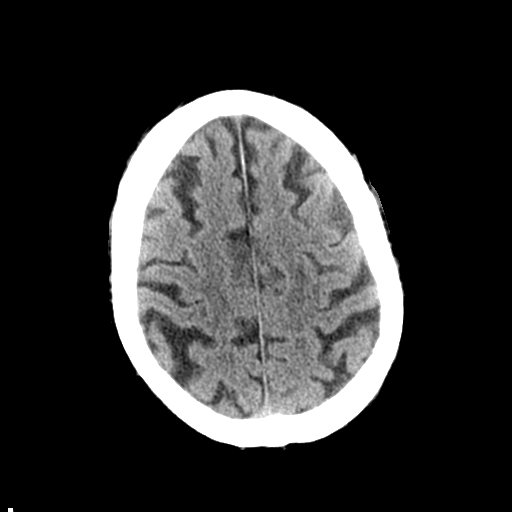
[im 29/34  brain]
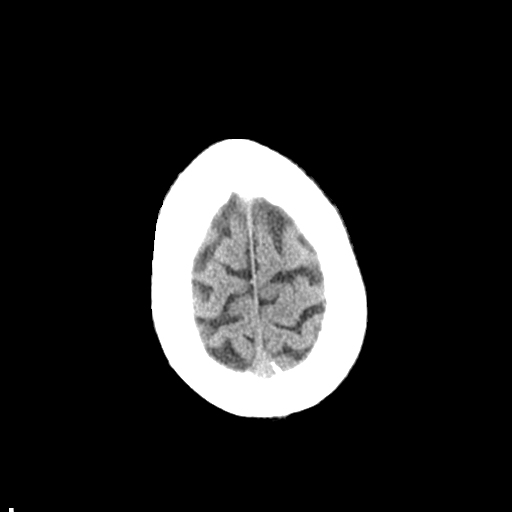

[Series 4: coronal soft tissue · coronal · 0.37mm/px · 3 of 77 slices shown]
[im 26/77  brain]
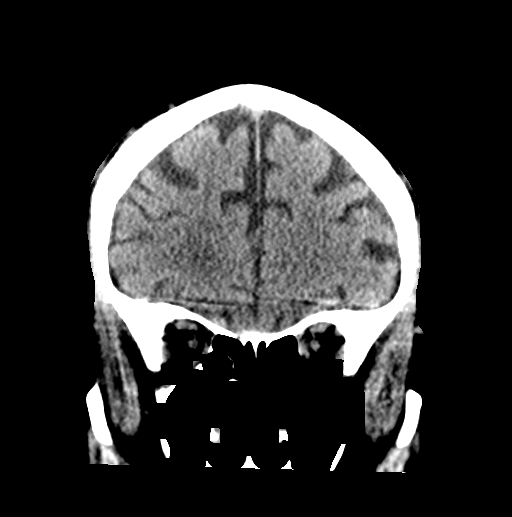
[im 34/77  brain]
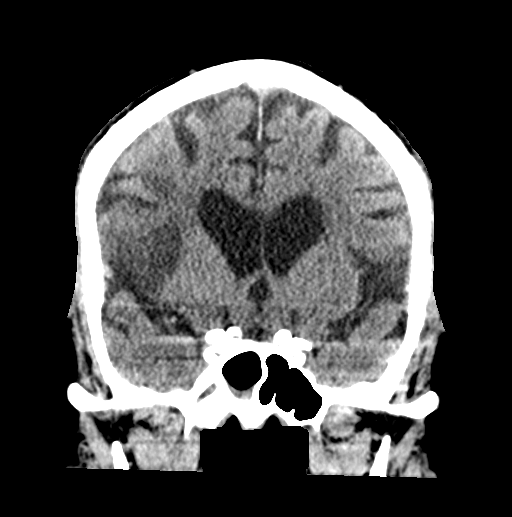
[im 43/77  brain]
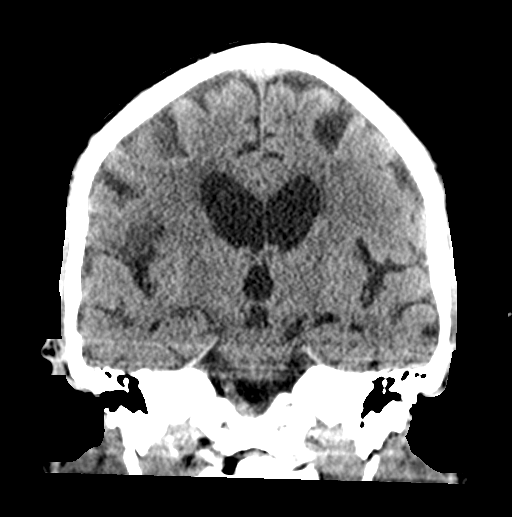

[Series 5: sagittal soft tissue · sagittal · 0.37mm/px · 3 of 55 slices shown]
[im 19/55  brain]
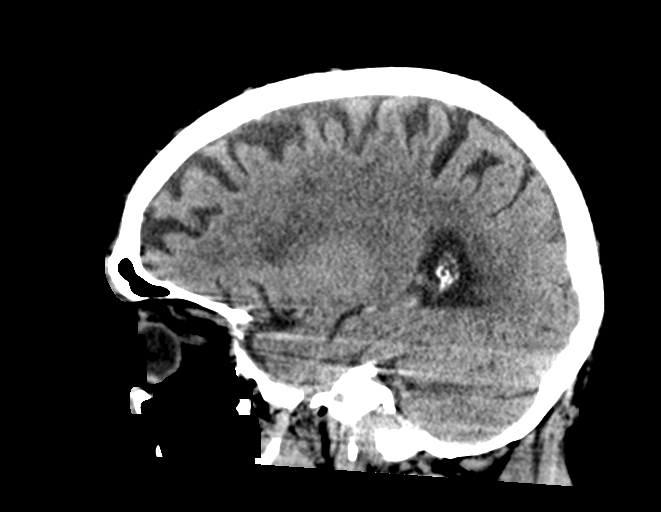
[im 28/55  brain]
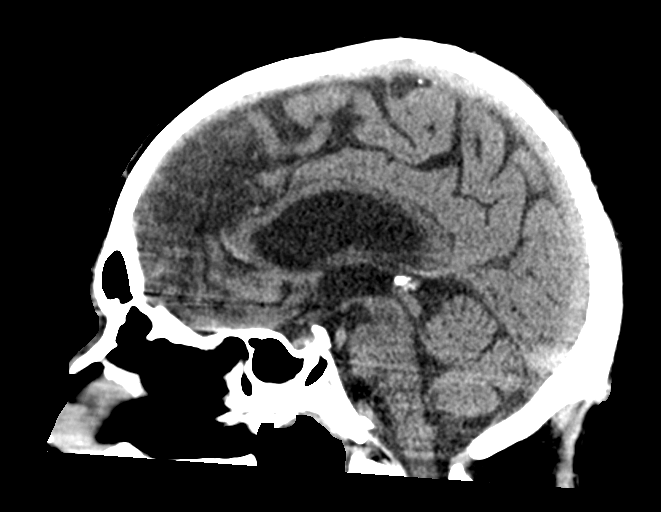
[im 37/55  brain]
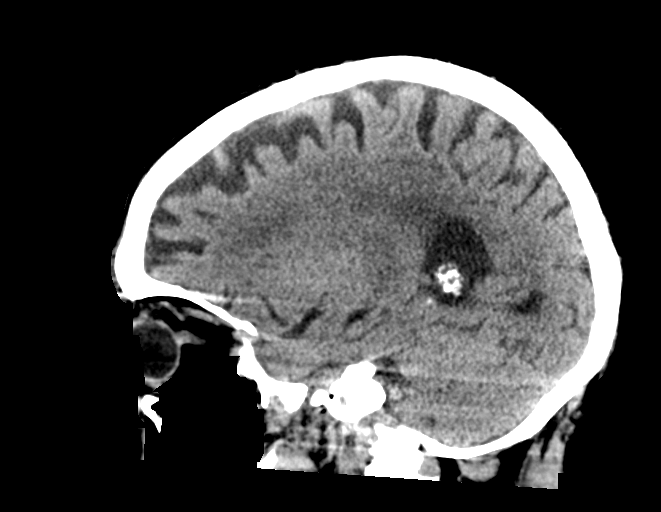

[16 of 47 positions shown; findings below may reference images not displayed]

FINDINGS: Brain: Hypodensity and loss of the normal gray-white matter
differentiation in the right insula and frontal operculum. No
hemorrhage, hydrocephalus, or extra-axial collection. 1.7 cm
partially calcified dural-based lesion near the left
cerebellopontine angle.

Vascular: Calcified atherosclerosis at the skull base. No hyperdense
vessel.

Skull: Normal. Negative for fracture or focal lesion.

Sinuses/Orbits: No acute finding. Mild mucosal thickening of the
right sphenoid sinus.

Other: None.
IMPRESSION: 1. Acute to subacute right MCA territory infarct involving the
insula and frontal operculum. No hemorrhage.
2. 1.7 cm partially calcified dural-based lesion near the left
cerebellopontine angle, likely a meningioma.

These results will be called to the ordering clinician or
representative by the Radiologist Assistant, and communication
documented in the PACS or [REDACTED].

## 2021-11-27 IMAGING — MR MR HEAD WO/W CM
17 of 18 series · 38 of 48 positions shown · IV contrast (gadavist)
Comparison: No prior MRI, correlation is made with CT head
[DATE]

CLINICAL DATA: Stroke, AFib on Eliquis, frequent falls and loss of
balance

EXAM:
MRI HEAD WITHOUT AND WITH CONTRAST
MRA HEAD WITHOUT CONTRAST
MRA NECK WITHOUT AND WITH CONTRAST
TECHNIQUE: Multiplanar, multi-echo pulse sequences of the brain and surrounding
structures were acquired without intravenous contrast. Angiographic
images of the Circle of Willis were acquired using MRA technique
without intravenous contrast. Angiographic images of the neck were
acquired using MRA technique without and with intravenous contrast.
Carotid stenosis measurements (when applicable) are obtained
utilizing NASCET criteria, using the distal internal carotid
diameter as the denominator.
CONTRAST:  8mL GADAVIST GADOBUTROL 1 MMOL/ML IV SOLN

[Series 5: ax dwi_tracew · axial · 3.0mm · 0.60mm/px · 1 of 48 slices shown]
[im 1/48]
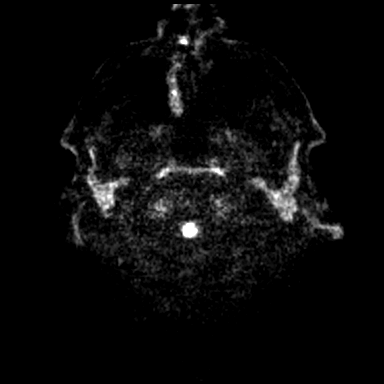

[Series 6: ax dwi_adc · axial · 3.0mm · 0.60mm/px · z∈[-19,+125]mm · 2 of 48 slices shown]
[im 1/48]
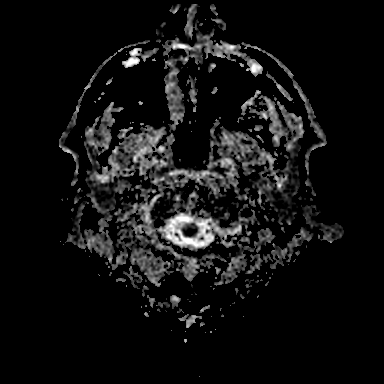
[im 48/48]
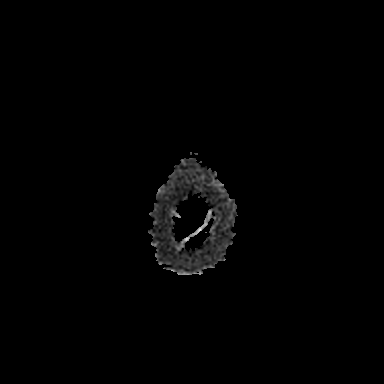

[Series 7: cor dwi_tracew · coronal · 5.0mm · 0.60mm/px · 1 of 40 slices shown]
[im 1/40]
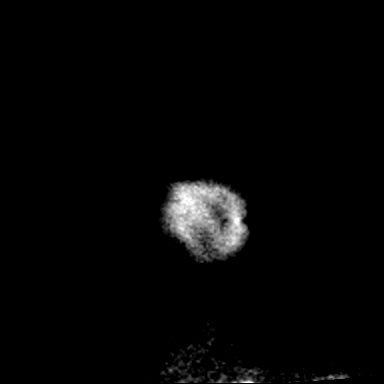

[Series 8: cor dwi_adc · coronal · 5.0mm · 0.60mm/px · 1 of 40 slices shown]
[im 1/40]
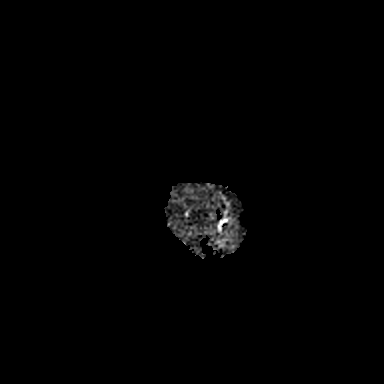

[Series 9: T1 · sagittal · 5.0mm · 0.62mm/px · 1 of 21 slices shown (1 of 2)]
[im 1/21]
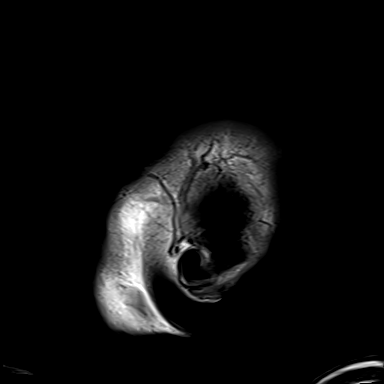

[Series 10: T2 · axial · 5.0mm · 0.53mm/px · 1 of 28 slices shown]
[im 1/28]
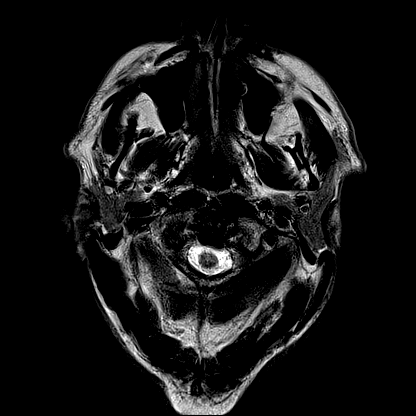

[Series 11: mag_images · axial · 3.0mm · 0.90mm/px · z∈[-30,+135]mm · 2 of 60 slices shown]
[im 1/60]
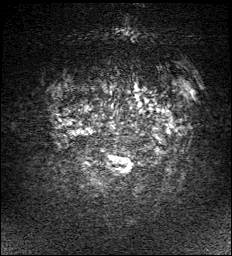
[im 60/60]
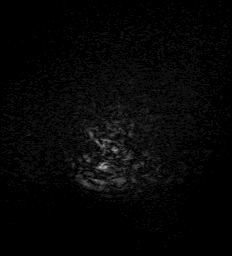

[Series 12: pha_images · axial · 3.0mm · 0.90mm/px · z∈[-25,+135]mm · 2 of 58 slices shown]
[im 1/58]
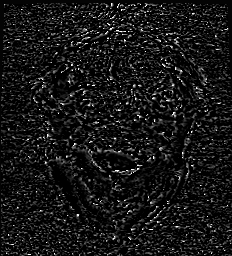
[im 58/58]
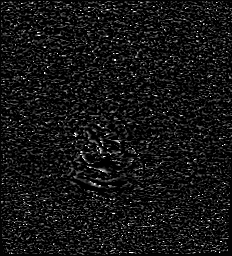

[Series 13: swi_images · axial · 3.0mm · 0.90mm/px · z∈[-30,+135]mm · 2 of 60 slices shown]
[im 1/60]
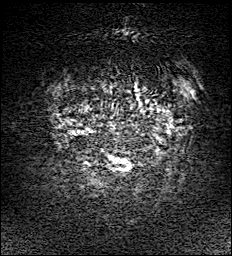
[im 60/60]
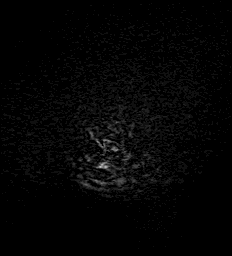

[Series 14: mip_images(sw) · axial · 24.0mm · 0.90mm/px · z∈[-21,+125]mm · 2 of 53 slices shown]
[im 1/53]
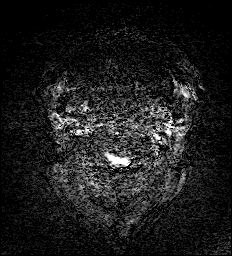
[im 53/53]
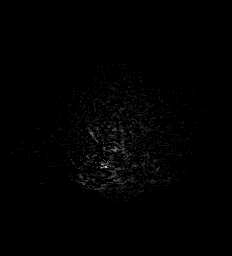

[Series 15: FLAIR · axial · 3.0mm · 0.53mm/px · z∈[-24,+126]mm · 2 of 55 slices shown]
[im 1/55]
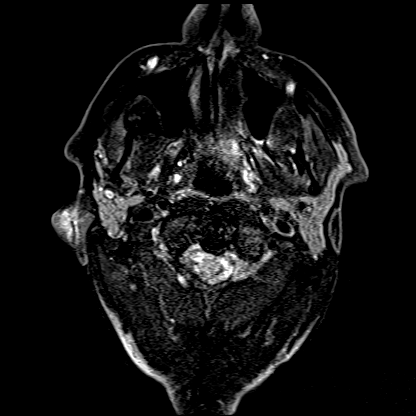
[im 55/55]
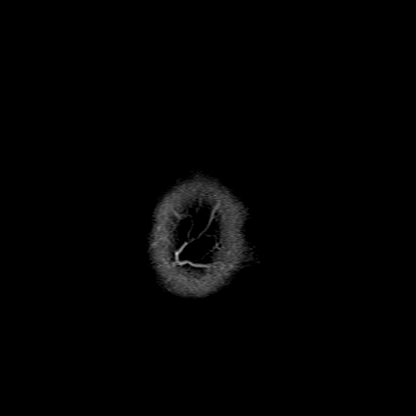

[Series 16: T1 · axial · 1.0mm · 0.98mm/px · z∈[-25,+138]mm · 6 of 176 slices shown (2 of 2)]
[im 1/176]
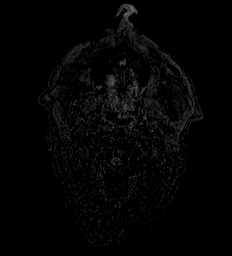
[im 36/176]
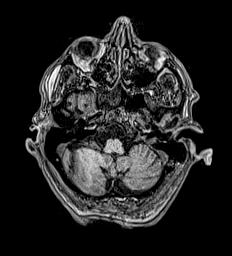
[im 71/176]
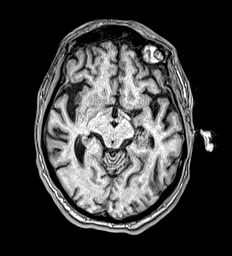
[im 106/176]
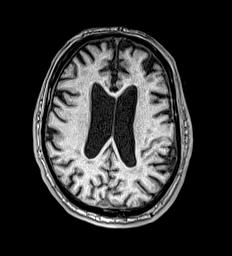
[im 141/176]
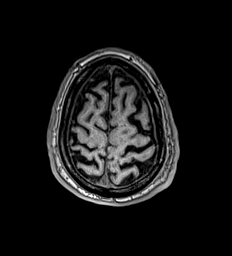
[im 176/176]
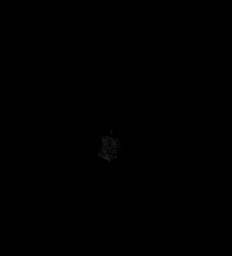

[Series 17: T2 post-contrast · coronal · 5.0mm · 0.57mm/px · 1 of 29 slices shown]
[im 1/29]
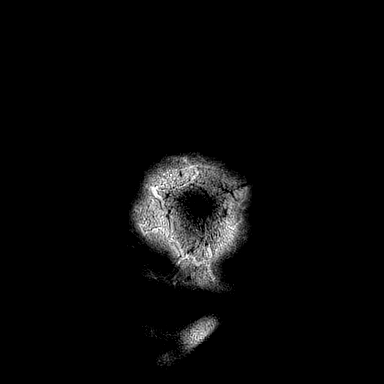

[Series 22: T1 post-contrast · coronal · 5.0mm · 0.57mm/px · 1 of 29 slices shown (1 of 3)]
[im 1/29]
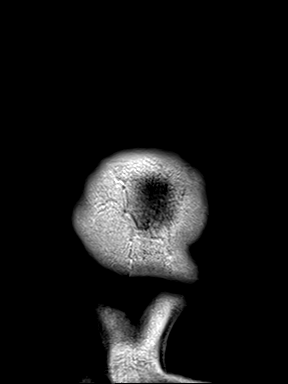

[Series 23: T1 post-contrast · sagittal · 5.0mm · 0.62mm/px · 1 of 21 slices shown (2 of 3)]
[im 1/21]
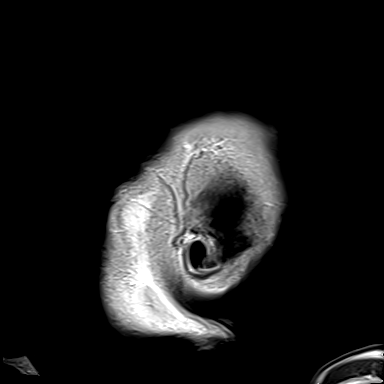

[Series 24: T1 post-contrast · axial · 1.0mm · 0.98mm/px · z∈[-25,+138]mm · 6 of 176 slices shown (3 of 3)]
[im 1/176]
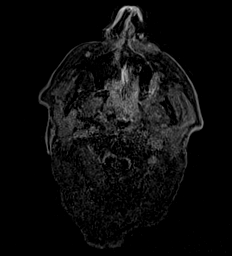
[im 36/176]
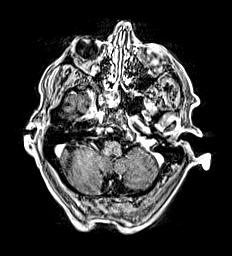
[im 71/176]
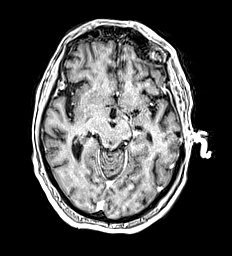
[im 106/176]
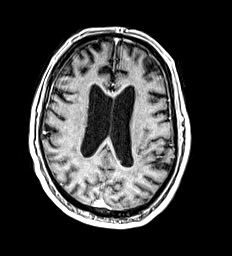
[im 141/176]
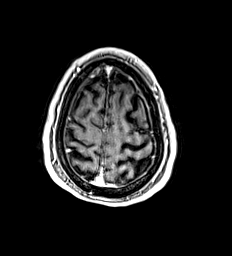
[im 176/176]
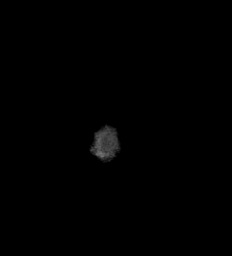

[Series 25: TOF · axial · 0.5mm · 0.54mm/px · z∈[-235,-92]mm · 6 of 430 slices shown]
[im 1/430]
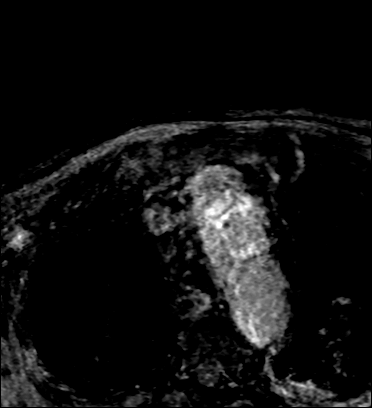
[im 62/430]
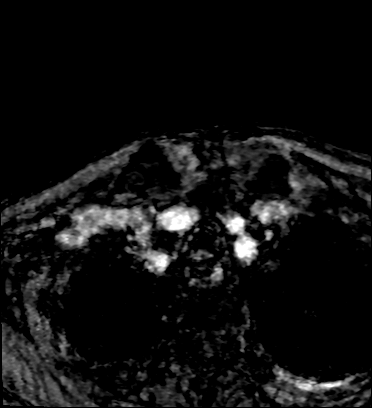
[im 123/430]
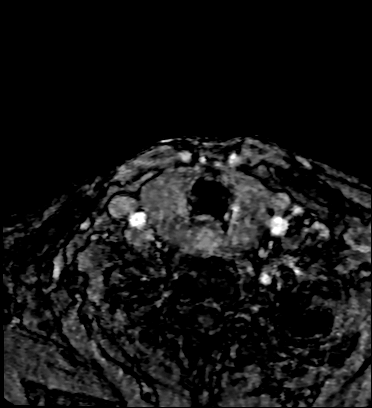
[im 184/430]
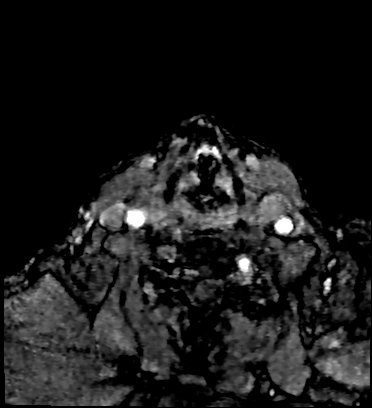
[im 246/430]
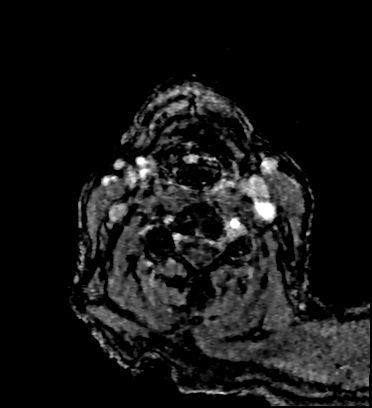
[im 307/430]
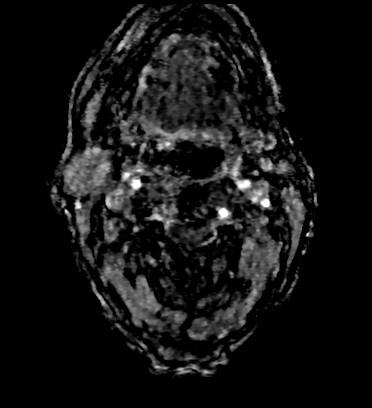

[38 of 48 positions shown; findings below may reference images not displayed]

FINDINGS: MRI HEAD FINDINGS

Brain: Restricted diffusion in the lateral right frontal lobe,
including the insula and operculum (series 5, images 19-34), as well
as a more discrete focus in the right parietal lobe (series 5, image
28). These areas demonstrate correlating low signal on the ADC map,
increased T2 signal, and minimal associated enhancement.

Dural-based enhancing mass along the left aspect of the cerebellum,
which measures 9 x 16 x 15 mm (AP x TR x CC) (series 24, image 48
and series 22, image 12), consistent with a meningioma.

No acute hemorrhage, mass effect, or midline shift. No hydrocephalus
or extra-axial collection. Degree of central volume loss is mildly
advanced for age. T2 hyperintense signal in the periventricular
white matter, likely the sequela of chronic small vessel ischemic
disease.

Vascular: Normal flow voids.

Skull and upper cervical spine: Normal marrow signal.

Sinuses/Orbits: Mucosal thickening in the right sphenoid sinus. The
sinuses are otherwise clear. The orbits are unremarkable.

Other: The mastoids are well aerated.

MRA HEAD FINDINGS

Anterior circulation: Both internal carotid arteries are patent to
the termini, with calcifications in the cavernous and supraclinoid
segments but without significant stenosis.

A1 segments patent, with a hypoplastic or stenotic left A1. Poor
visualization of the left A2 segment, with possible azygous ACA.
Anterior cerebral arteries are otherwise patent to their distal
aspects.

No M1 stenosis or occlusion. Normal MCA bifurcations. Multifocal
narrowing in the anterior greater than posterior right MCA branches
(series 5, images 113 and 129, for example). Multifocal narrowing is
also noted in the left MCA branches. Some of this may be
artifactual, secondary to motion. Distal MCA branches perfused and
symmetric.

Posterior circulation: Left dominant vertebral system. The left
vertebral artery is patent to the vertebrobasilar junction. The
right vertebral artery is not opacified proximally and is only
diminutive and opacified just proximal to the vertebrobasilar
junction, likely retrograde. Posterior inferior cerebral arteries
patent bilaterally.

Basilar patent to its distal aspect. Superior cerebellar arteries
patent bilaterally.

Patent P1 segments. PCAs perfused to their distal aspects without
stenosis. The bilateral posterior communicating arteries are not
visualized.

Anatomic variants: None significant

MRA NECK FINDINGS

Evaluation is limited by missed contrast bolus and motion artifact.
Within this limitation, the right vertebral artery is not opacified
in the neck. The left vertebral artery appears patent from its
origin to the skull base. The bilateral carotid arteries appear
patent, without hemodynamically significant stenosis, although
motion complicates evaluation of the bifurcations bilaterally.
IMPRESSION: 1. Infarct in the lateral right frontal lobe, favored to be at least
partially subacute which involves the insula and operculum,
consistent with the anterior right MCA territory infarct seen on the
same-day CT head. Additional focal acute infarct in the right
parietal lobe may suggest an embolic etiology, given slightly
different vascular supply.
2. 1.6 cm meningioma along the left tentorium.
3. Non opacification of the proximal intracranial right vertebral
artery. No other intracranial large vessel occlusion.
4. Multifocal narrowing in the anterior greater than posterior right
MCA branches, left MCA branches, and left A1/A2. Some of this may be
artifactual, secondary to motion.
5. The neck MRA is of limited utility, given motion and missed
contrast bolus. Within this limitation, the right vertebral artery
appears to occluded for the entirety of its course, of indeterminate
acuity. Remaining neck vasculature appears patent, although
evaluation of the bifurcations is particularly limited. If there is
clinical indication for evaluation of the carotid bifurcations or
right vertebral artery, a CTA of the head and neck is recommended.

These results will be called to the ordering clinician or
representative by the Radiologist Assistant, and communication
documented in the PACS or [REDACTED].

## 2021-11-27 IMAGING — MR MR MRA NECK WO/W CM
1 of 3 series · 27 of 48 positions shown · IV contrast (8ml Gadavist)
Comparison: No prior MRI, correlation is made with CT head
[DATE]

CLINICAL DATA: Stroke, AFib on Eliquis, frequent falls and loss of
balance

EXAM:
MRI HEAD WITHOUT AND WITH CONTRAST
MRA HEAD WITHOUT CONTRAST
MRA NECK WITHOUT AND WITH CONTRAST
TECHNIQUE: Multiplanar, multi-echo pulse sequences of the brain and surrounding
structures were acquired without intravenous contrast. Angiographic
images of the Circle of Willis were acquired using MRA technique
without intravenous contrast. Angiographic images of the neck were
acquired using MRA technique without and with intravenous contrast.
Carotid stenosis measurements (when applicable) are obtained
utilizing NASCET criteria, using the distal internal carotid
diameter as the denominator.
CONTRAST:  8mL GADAVIST GADOBUTROL 1 MMOL/ML IV SOLN

[Series 25: TOF · axial · 0.5mm · 0.54mm/px · z∈[-235,-47]mm · 27 of 430 slices shown]
[im 1/430]
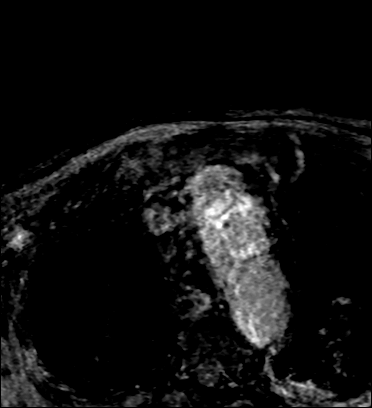
[im 13/430]
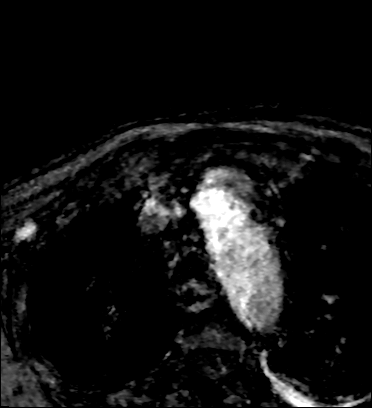
[im 25/430]
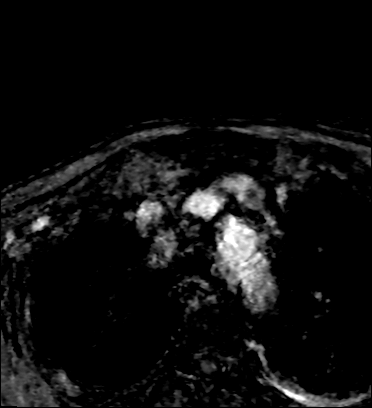
[im 37/430]
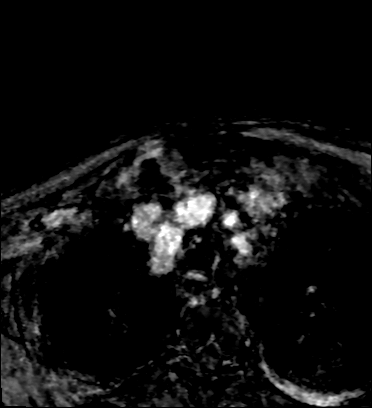
[im 50/430]
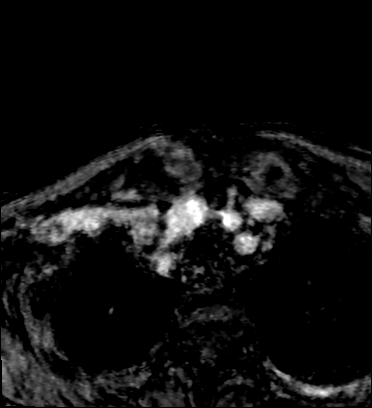
[im 62/430]
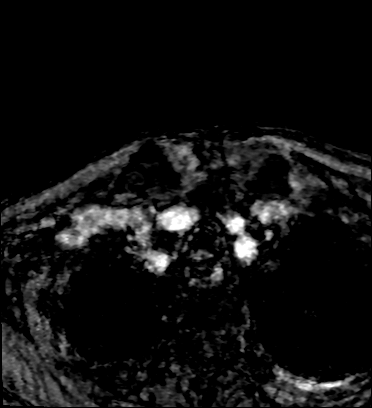
[im 74/430]
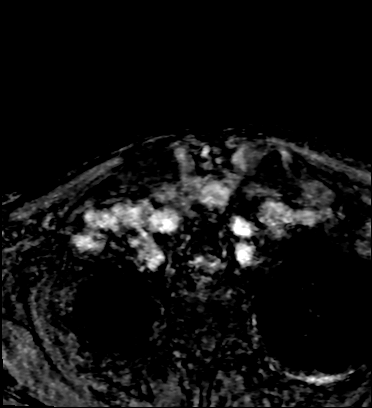
[im 86/430]
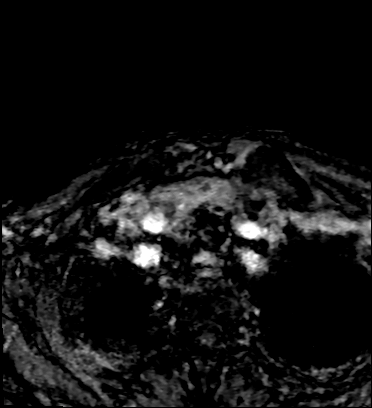
[im 99/430]
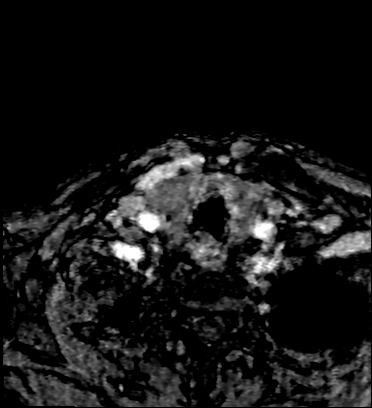
[im 111/430]
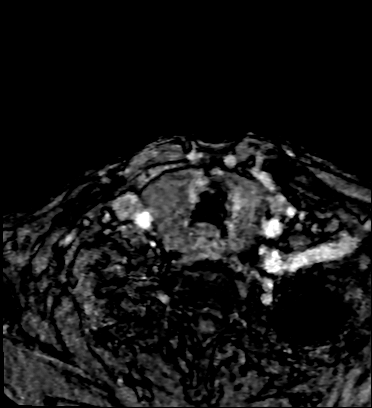
[im 123/430]
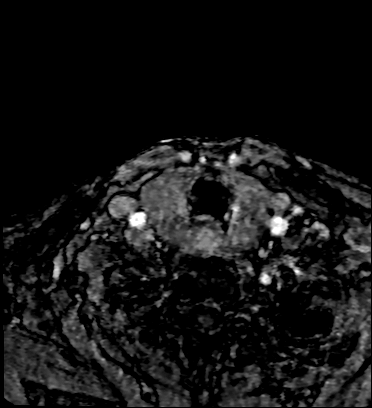
[im 135/430]
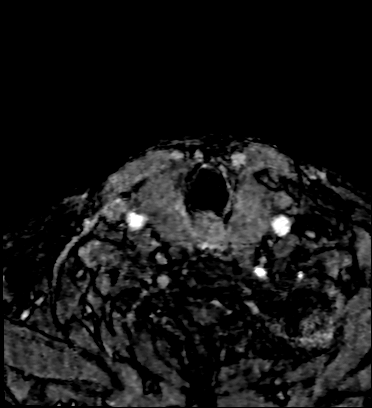
[im 148/430]
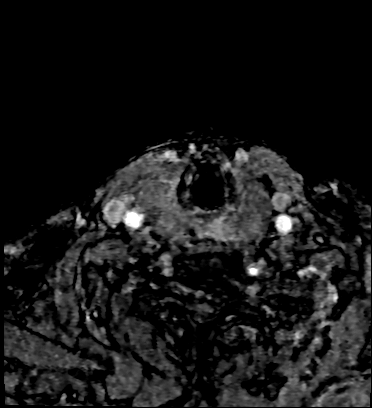
[im 160/430]
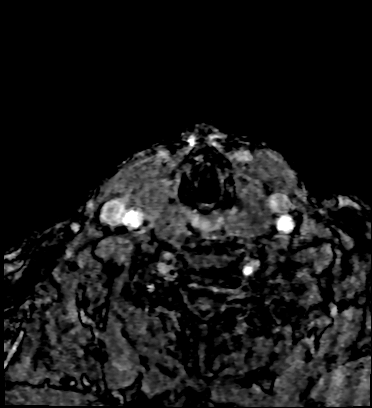
[im 172/430]
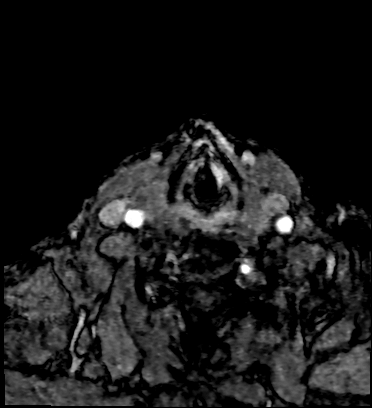
[im 184/430]
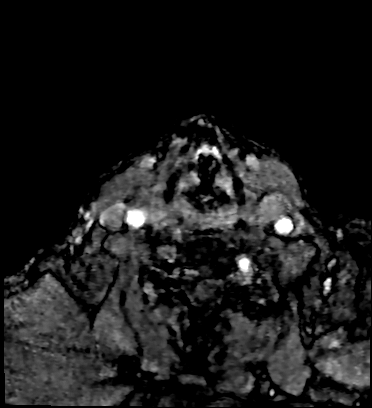
[im 197/430]
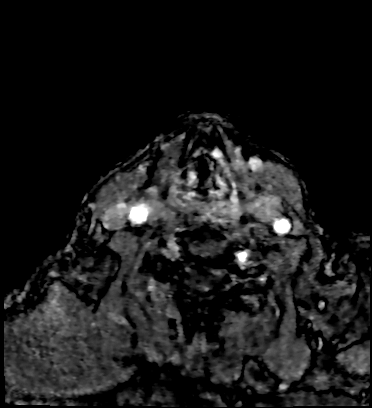
[im 209/430]
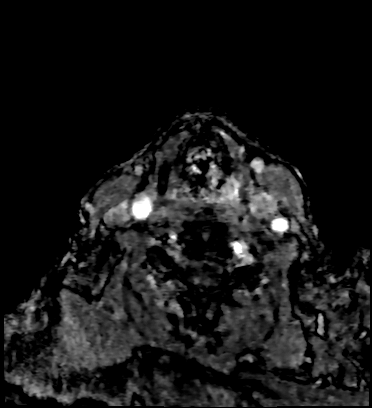
[im 221/430]
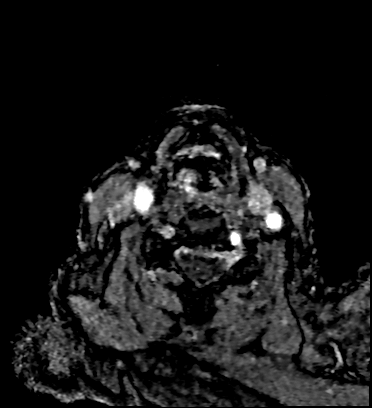
[im 233/430]
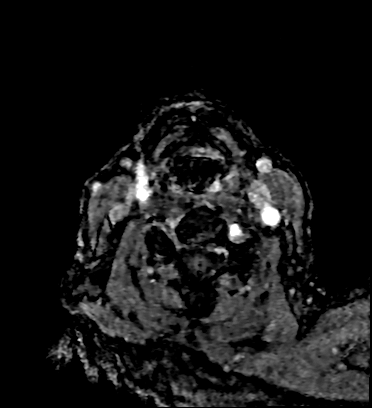
[im 246/430]
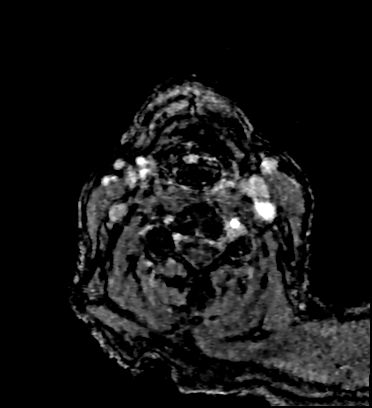
[im 258/430]
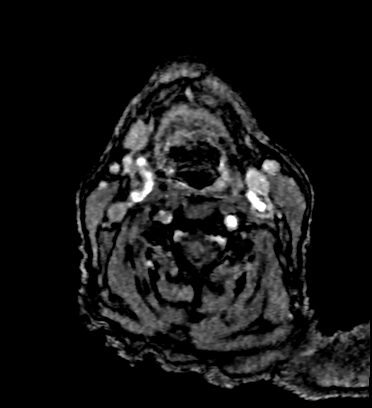
[im 270/430]
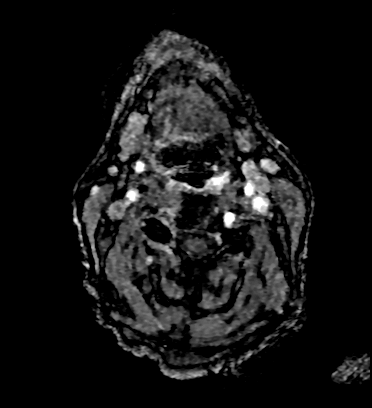
[im 295/430]
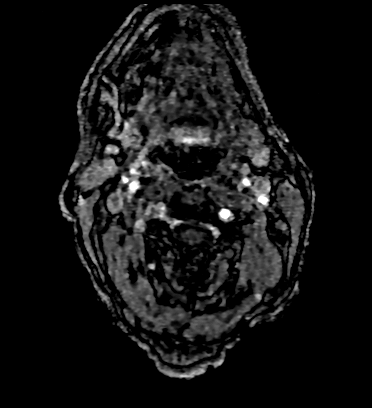
[im 356/430]
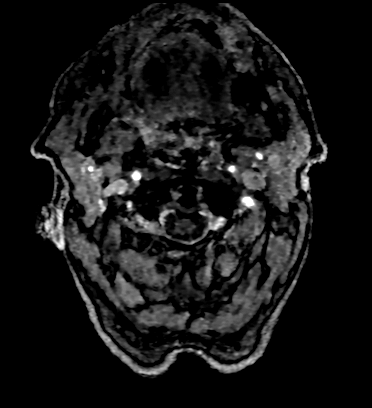
[im 368/430]
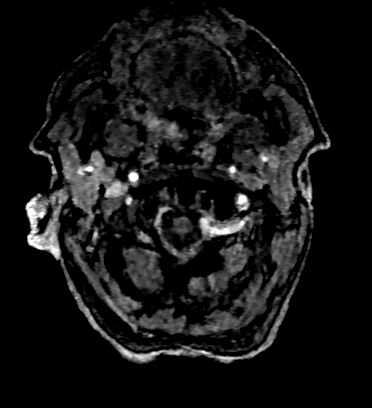
[im 405/430]
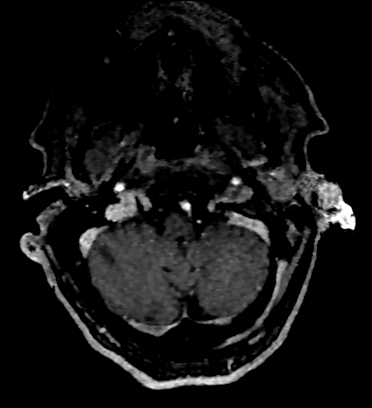

[27 of 48 positions shown; findings below may reference images not displayed]

FINDINGS: MRI HEAD FINDINGS

Brain: Restricted diffusion in the lateral right frontal lobe,
including the insula and operculum (series 5, images 19-34), as well
as a more discrete focus in the right parietal lobe (series 5, image
28). These areas demonstrate correlating low signal on the ADC map,
increased T2 signal, and minimal associated enhancement.

Dural-based enhancing mass along the left aspect of the cerebellum,
which measures 9 x 16 x 15 mm (AP x TR x CC) (series 24, image 48
and series 22, image 12), consistent with a meningioma.

No acute hemorrhage, mass effect, or midline shift. No hydrocephalus
or extra-axial collection. Degree of central volume loss is mildly
advanced for age. T2 hyperintense signal in the periventricular
white matter, likely the sequela of chronic small vessel ischemic
disease.

Vascular: Normal flow voids.

Skull and upper cervical spine: Normal marrow signal.

Sinuses/Orbits: Mucosal thickening in the right sphenoid sinus. The
sinuses are otherwise clear. The orbits are unremarkable.

Other: The mastoids are well aerated.

MRA HEAD FINDINGS

Anterior circulation: Both internal carotid arteries are patent to
the termini, with calcifications in the cavernous and supraclinoid
segments but without significant stenosis.

A1 segments patent, with a hypoplastic or stenotic left A1. Poor
visualization of the left A2 segment, with possible azygous ACA.
Anterior cerebral arteries are otherwise patent to their distal
aspects.

No M1 stenosis or occlusion. Normal MCA bifurcations. Multifocal
narrowing in the anterior greater than posterior right MCA branches
(series 5, images 113 and 129, for example). Multifocal narrowing is
also noted in the left MCA branches. Some of this may be
artifactual, secondary to motion. Distal MCA branches perfused and
symmetric.

Posterior circulation: Left dominant vertebral system. The left
vertebral artery is patent to the vertebrobasilar junction. The
right vertebral artery is not opacified proximally and is only
diminutive and opacified just proximal to the vertebrobasilar
junction, likely retrograde. Posterior inferior cerebral arteries
patent bilaterally.

Basilar patent to its distal aspect. Superior cerebellar arteries
patent bilaterally.

Patent P1 segments. PCAs perfused to their distal aspects without
stenosis. The bilateral posterior communicating arteries are not
visualized.

Anatomic variants: None significant

MRA NECK FINDINGS

Evaluation is limited by missed contrast bolus and motion artifact.
Within this limitation, the right vertebral artery is not opacified
in the neck. The left vertebral artery appears patent from its
origin to the skull base. The bilateral carotid arteries appear
patent, without hemodynamically significant stenosis, although
motion complicates evaluation of the bifurcations bilaterally.
IMPRESSION: 1. Infarct in the lateral right frontal lobe, favored to be at least
partially subacute which involves the insula and operculum,
consistent with the anterior right MCA territory infarct seen on the
same-day CT head. Additional focal acute infarct in the right
parietal lobe may suggest an embolic etiology, given slightly
different vascular supply.
2. 1.6 cm meningioma along the left tentorium.
3. Non opacification of the proximal intracranial right vertebral
artery. No other intracranial large vessel occlusion.
4. Multifocal narrowing in the anterior greater than posterior right
MCA branches, left MCA branches, and left A1/A2. Some of this may be
artifactual, secondary to motion.
5. The neck MRA is of limited utility, given motion and missed
contrast bolus. Within this limitation, the right vertebral artery
appears to occluded for the entirety of its course, of indeterminate
acuity. Remaining neck vasculature appears patent, although
evaluation of the bifurcations is particularly limited. If there is
clinical indication for evaluation of the carotid bifurcations or
right vertebral artery, a CTA of the head and neck is recommended.

These results will be called to the ordering clinician or
representative by the Radiologist Assistant, and communication
documented in the PACS or [REDACTED].

## 2021-11-27 MED ORDER — SODIUM CHLORIDE 0.9 % IV SOLN: INTRAVENOUS | Status: AC

## 2021-11-27 MED ORDER — LORAZEPAM 2 MG/ML IJ SOLN
1.0000 mg | INTRAMUSCULAR | Status: DC | PRN
  Administered 2021-11-30 (×4): 2 mg via INTRAVENOUS
  Filled 2021-11-27: qty 2
  Filled 2021-11-27 (×3): qty 1

## 2021-11-27 MED ORDER — MECLIZINE HCL 25 MG PO TABS
12.5000 mg | ORAL_TABLET | Freq: Three times a day (TID) | ORAL | Status: DC | PRN
  Filled 2021-11-27: qty 0.5

## 2021-11-27 MED ORDER — MOMETASONE FURO-FORMOTEROL FUM 200-5 MCG/ACT IN AERO
2.0000 | INHALATION_SPRAY | Freq: Two times a day (BID) | RESPIRATORY_TRACT | Status: DC
  Administered 2021-11-28 – 2021-11-30 (×4): 2 via RESPIRATORY_TRACT
  Filled 2021-11-27 (×2): qty 8.8

## 2021-11-27 MED ORDER — STROKE: EARLY STAGES OF RECOVERY BOOK: Freq: Once | Status: AC

## 2021-11-27 MED ORDER — QUETIAPINE FUMARATE 200 MG PO TABS
200.0000 mg | ORAL_TABLET | Freq: Every day | ORAL | Status: DC
  Administered 2021-11-27 – 2021-11-29 (×3): 200 mg via ORAL
  Filled 2021-11-27: qty 1
  Filled 2021-11-27: qty 8
  Filled 2021-11-27: qty 1
  Filled 2021-11-27: qty 8

## 2021-11-27 MED ORDER — AZITHROMYCIN 500 MG PO TABS
500.0000 mg | ORAL_TABLET | Freq: Every day | ORAL | Status: DC
  Administered 2021-11-28 – 2021-11-30 (×3): 500 mg via ORAL
  Filled 2021-11-27 (×3): qty 1

## 2021-11-27 MED ORDER — LORAZEPAM 1 MG PO TABS: 1.0000 mg | ORAL_TABLET | ORAL | Status: DC | PRN

## 2021-11-27 MED ORDER — ACETAMINOPHEN 160 MG/5ML PO SOLN
650.0000 mg | ORAL | Status: DC | PRN
  Filled 2021-11-27: qty 20.3

## 2021-11-27 MED ORDER — OXYCODONE-ACETAMINOPHEN 5-325 MG PO TABS: 1.0000 | ORAL_TABLET | ORAL | Status: DC | PRN

## 2021-11-27 MED ORDER — ADULT MULTIVITAMIN W/MINERALS CH
1.0000 | ORAL_TABLET | Freq: Every day | ORAL | Status: DC
  Administered 2021-11-27 – 2021-11-30 (×4): 1 via ORAL
  Filled 2021-11-27 (×4): qty 1

## 2021-11-27 MED ORDER — THIAMINE HCL 100 MG/ML IJ SOLN
100.0000 mg | Freq: Every day | INTRAMUSCULAR | Status: DC
  Filled 2021-11-27: qty 2

## 2021-11-27 MED ORDER — HYDRALAZINE HCL 50 MG PO TABS: 25.0000 mg | ORAL_TABLET | Freq: Four times a day (QID) | ORAL | Status: DC | PRN

## 2021-11-27 MED ORDER — GADOBUTROL 1 MMOL/ML IV SOLN
8.0000 mL | Freq: Once | INTRAVENOUS | Status: AC | PRN
  Administered 2021-11-27: 8 mL via INTRAVENOUS

## 2021-11-27 MED ORDER — SODIUM CHLORIDE 0.9% FLUSH
3.0000 mL | Freq: Once | INTRAVENOUS | Status: AC
  Administered 2021-11-27: 3 mL via INTRAVENOUS

## 2021-11-27 MED ORDER — PAROXETINE HCL 20 MG PO TABS
20.0000 mg | ORAL_TABLET | Freq: Every day | ORAL | Status: DC
  Administered 2021-11-28 – 2021-11-30 (×3): 20 mg via ORAL
  Filled 2021-11-27 (×3): qty 1

## 2021-11-27 MED ORDER — SODIUM CHLORIDE 0.9 % IV SOLN
500.0000 mg | INTRAVENOUS | Status: AC
  Administered 2021-11-27: 500 mg via INTRAVENOUS
  Filled 2021-11-27: qty 5

## 2021-11-27 MED ORDER — PREDNISONE 10 MG PO TABS
40.0000 mg | ORAL_TABLET | Freq: Every day | ORAL | Status: DC
  Administered 2021-11-27 – 2021-11-30 (×4): 40 mg via ORAL
  Filled 2021-11-27 (×4): qty 2

## 2021-11-27 MED ORDER — ENSURE ENLIVE PO LIQD
237.0000 mL | Freq: Three times a day (TID) | ORAL | Status: DC
  Administered 2021-11-27 – 2021-11-28 (×3): 237 mL via ORAL

## 2021-11-27 MED ORDER — IPRATROPIUM-ALBUTEROL 0.5-2.5 (3) MG/3ML IN SOLN
3.0000 mL | Freq: Four times a day (QID) | RESPIRATORY_TRACT | Status: DC
  Administered 2021-11-28 (×2): 3 mL via RESPIRATORY_TRACT
  Filled 2021-11-27 (×2): qty 3

## 2021-11-27 MED ORDER — ALBUTEROL SULFATE (2.5 MG/3ML) 0.083% IN NEBU
2.5000 mg | INHALATION_SOLUTION | RESPIRATORY_TRACT | Status: DC | PRN
  Administered 2021-11-29 (×2): 2.5 mg via RESPIRATORY_TRACT
  Filled 2021-11-27 (×3): qty 3

## 2021-11-27 MED ORDER — FOLIC ACID 1 MG PO TABS
1.0000 mg | ORAL_TABLET | Freq: Every day | ORAL | Status: DC
  Administered 2021-11-27 – 2021-11-30 (×4): 1 mg via ORAL
  Filled 2021-11-27 (×4): qty 1

## 2021-11-27 MED ORDER — CLOPIDOGREL BISULFATE 75 MG PO TABS
75.0000 mg | ORAL_TABLET | Freq: Every day | ORAL | Status: DC
Start: 2021-11-28 — End: 2021-11-28

## 2021-11-27 MED ORDER — SIMVASTATIN 20 MG PO TABS
20.0000 mg | ORAL_TABLET | Freq: Every day | ORAL | Status: DC
  Administered 2021-11-27 – 2021-11-29 (×3): 20 mg via ORAL
  Filled 2021-11-27 (×3): qty 1

## 2021-11-27 MED ORDER — BUDESONIDE 0.5 MG/2ML IN SUSP
2.0000 mg | Freq: Two times a day (BID) | RESPIRATORY_TRACT | Status: DC
  Administered 2021-11-28: 2 mg via RESPIRATORY_TRACT
  Filled 2021-11-27 (×2): qty 8

## 2021-11-27 MED ORDER — LITHIUM CARBONATE 300 MG PO CAPS
300.0000 mg | ORAL_CAPSULE | Freq: Two times a day (BID) | ORAL | Status: DC
Start: 2021-11-27 — End: 2021-11-30
  Administered 2021-11-27 – 2021-11-30 (×6): 300 mg via ORAL
  Filled 2021-11-27 (×7): qty 1

## 2021-11-27 MED ORDER — ACETAMINOPHEN 325 MG PO TABS: 650.0000 mg | ORAL_TABLET | ORAL | Status: DC | PRN

## 2021-11-27 MED ORDER — ACETAMINOPHEN 650 MG RE SUPP: 650.0000 mg | RECTAL | Status: DC | PRN

## 2021-11-27 MED ORDER — SENNOSIDES-DOCUSATE SODIUM 8.6-50 MG PO TABS: 1.0000 | ORAL_TABLET | Freq: Every evening | ORAL | Status: DC | PRN

## 2021-11-27 MED ORDER — THIAMINE HCL 100 MG PO TABS
100.0000 mg | ORAL_TABLET | Freq: Every day | ORAL | Status: DC
  Administered 2021-11-27 – 2021-11-30 (×4): 100 mg via ORAL
  Filled 2021-11-27 (×4): qty 1

## 2021-11-27 MED ORDER — APIXABAN 5 MG PO TABS
5.0000 mg | ORAL_TABLET | Freq: Two times a day (BID) | ORAL | Status: DC
  Administered 2021-11-27 – 2021-11-28 (×2): 5 mg via ORAL
  Filled 2021-11-27 (×2): qty 1

## 2021-11-27 NOTE — Progress Notes (Signed)
? ?  Patient: Keith Levy  ?EQA:[id number]  ?DOB: 10/10/1940  ? ?Subjective: Nurse reports patient sats drop 84% on room air, improved with oxygen 2LNC to 97% ?Bedside patient denies shortness of breath or wheezing.  He denies need for oxygen in hospitalizations in past (last inpatient [place] 3 months ago). ?Admits to left sided chest pain with coughing post fall 3 days ago. No chest pain at rest or movement, just coughing. Admits to increased coughing last couple of days ?Objective: ?Vitals:  ? 11/27/21 1900 11/27/21 1949  ?BP: (!) 179/93 (!) 179/93  ?Pulse: 70 70  ?Resp: (!) 32   ?Temp:    ?SpO2: 95%   ? ?Stat Chest xray  ?FINDINGS: ?Heart is borderline in size. Bibasilar atelectasis or scarring. No ?confluent airspace opacities, effusions or edema. No acute bony ?abnormality. ? IMPRESSION: ?Borderline heart size. ? Bibasilar scarring or atelectasis. ? No active disease. ? ?Assessment:  ?Resp. Respirations unlabored but 27/min at rest. Sats 97% on 2 L [place] supplemental oxygen.  BBS course throughout both lungs.  ?Pain with palpation mid axillary just below nipple line. ?CV: SR with PVC's on monitor. S1S2, No murmurs appreciated  ?Neuro: alert and oriented to self and place, not date/time,   MAE, generalized weakness, no focal weakness ? ?Plan: ?Acute on Chronic resp failure ?-Encourage IS and [place] ?-cough and deep breathe ?-Instruct patient on chest splinting to assist with left sided chest discomfort when coughing ? ?Kathlene Cote NP ?[place] ?Cross Cover [id number] ?  ?

## 2021-11-27 NOTE — ED Triage Notes (Addendum)
Pt comes with c/o fall over the weekend and was on floor for few hours before being able to get up. EMS was called and checked out pt but he refused to go. ? ?Pt went to PCP today and scan completed opt and revealed a stroke. Pt is on blood thinners. Pt is alert at this time. Family reports slurred speech and unable to comprehend some normal everyday things. ? ?Pt is breathing heavy and states pain in chest area. ?

## 2021-11-27 NOTE — ED Notes (Signed)
Pt put on O2 2L via [place] due to saturations dropping to 84%.  ?

## 2021-11-27 NOTE — H&P (Signed)
?History and Physical  ? ? ?Keith Levy GDJ:[id number] DOB: 1941-08-18 DOA: 11/27/2021 ? ?PCP: Rusty Aus, MD (Confirm with patient/family/NH records and if not entered, this has to be entered at [facility] point of entry) ?Patient coming from: Home ? ?I have personally briefly reviewed patient's old medical records in [place] ? ?Chief Complaint: Feeling dizzy, frequent falls ? ?HPI: Keith Levy is a 81 y.o. male with medical history significant of PAD, chronic A-fib on Eliquis, CKD stage IIIa, HLD, question of COPD, bipolar disorder, cigar smoker, alcohol abuse, came with frequent falls and loss of balance. ? ?Symptoms started 3 days ago, patient suddenly developed lightheadedness, denies any spinning sensation, denies any weakness numbness of any of the limbs.  He sustained total of 4 falls in 3 days.  1 time he hit his forehead, the rest of the case he was able to grab something and did not hit hard.  No loss of consciousness.  He said the lightheadedness has been persistent, no correlation with head or body movement, no hearing changes.  He is also reported that recently he started to have wheezing and productive cough of clear phlegm, he denied any history of COPD but daughter at bedside reported patient does have COPD.  He admitted he smokes every day and drinks 3-4 beers a day.  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 ?ED Course: Blood pressure elevated, controlled A-fib.  Afebrile, O2 saturation 100%. ? ?Review of Systems: As per HPI otherwise 14 point review of systems negative.  ? ? ?Past Medical History:  ?Diagnosis Date  ? Bipolar disorder ([place])   ? CKD (chronic kidney disease), stage III ([place])   ? Hyperlipidemia   ? PAD (peripheral artery disease) ([place])   ? Pre-diabetes   ? ? ?Past Surgical History:  ?Procedure  Laterality Date  ? ENDARTERECTOMY FEMORAL Right 05/22/2021  ? Procedure: RIGHT ENDARTERECTOMY FEMORAL; INSERTION OF SFA AND POPLITEAL STENTS, THROMBECTOMY ILIAC;  Surgeon: Algernon Huxley, MD;  Location: [facility] ORS;  Service: Vascular;  Laterality: Right;  ? INSERTION OF ILIAC STENT Bilateral 05/22/2021  ? Procedure: INSERTION OF ILIAC STENT, BILATERAL;  Surgeon: Algernon Huxley, MD;  Location: [facility] ORS;  Service: Vascular;  Laterality: Bilateral;  ? LOWER EXTREMITY ANGIOGRAPHY Right 10/22/2020  ? Procedure: LOWER EXTREMITY ANGIOGRAPHY;  Surgeon: Algernon Huxley, MD;  Location: [place] [facility];  Service: Cardiovascular;  Laterality: Right;  ? LOWER EXTREMITY ANGIOGRAPHY Left 11/01/2020  ? Procedure: LOWER EXTREMITY ANGIOGRAPHY;  Surgeon: Algernon Huxley, MD;  Location: [place] [facility];  Service: Cardiovascular;  Laterality: Left;  ? LOWER EXTREMITY ANGIOGRAPHY Right 12/06/2020  ? Procedure: LOWER EXTREMITY ANGIOGRAPHY;  Surgeon: Algernon Huxley, MD;  Location: [place] [facility];  Service: Cardiovascular;  Laterality: Right;  ? LOWER EXTREMITY ANGIOGRAPHY Right 05/16/2021  ? Procedure: LOWER EXTREMITY ANGIOGRAPHY;  Surgeon: Algernon Huxley, MD;  Location: [place] [facility];  Service: Cardiovascular;  Laterality: Right;  ? VASCULAR SURGERY    ? stents on LLE and RLE  ? ? ? reports that he quit smoking about 6 months ago. His smoking use included cigarettes. He smoked an average of 1 pack per day. He has never used smokeless tobacco. He reports current alcohol use of about 8.0 standard drinks per week. He reports that he does  not use drugs. ? ?No Known Allergies ? ?History reviewed. No pertinent family history. ? ? ?Prior to Admission medications   ?Medication Sig Start Date End Date Taking? Authorizing Provider  ?amoxicillin-clavulanate (AUGMENTIN) 500-125 MG tablet Take 1 tablet by mouth 3 (three) times daily. 06/21/21   [provider]  ?apixaban (ELIQUIS) 5 MG TABS tablet Take 1 tablet (5 mg total) by mouth 2 (two) times  daily. 05/28/21   Stegmayer, Janalyn Harder, PA-C  ?aspirin 81 MG EC tablet Take 81 mg by mouth daily.    [provider]  ?clopidogrel (PLAVIX) 75 MG tablet Take 75 mg by mouth daily. 10/20/21   [provider]  ?feeding supplement (ENSURE ENLIVE / ENSURE PLUS) LIQD Take 237 mLs by mouth 3 (three) times daily between meals. 05/28/21   Stegmayer, Joelene Millin A, PA-C  ?furosemide (LASIX) 20 MG tablet Take 20 mg by mouth daily. 11/06/20 11/06/21  [provider]  ?lithium carbonate 300 MG capsule Take 300 mg by mouth 2 (two) times daily. 09/24/20   [provider]  ?Multiple Vitamin (MULTIVITAMIN WITH MINERALS) TABS tablet Take 1 tablet by mouth daily. 05/29/21   Stegmayer, Janalyn Harder, PA-C  ?oxyCODONE-acetaminophen (PERCOCET/ROXICET) 5-325 MG tablet Take 1-2 tablets by mouth every 4 (four) hours as needed for moderate pain. 05/29/21   Stegmayer, Janalyn Harder, PA-C  ?PARoxetine (PAXIL) 20 MG tablet Take 20 mg by mouth daily. 06/11/20   [provider]  ?QUEtiapine (SEROQUEL) 200 MG tablet Take 200 mg by mouth at bedtime. 11/04/19   [provider]  ?QUEtiapine (SEROQUEL) 50 MG tablet Take 50 mg by mouth at bedtime.    [provider]  ?simvastatin (ZOCOR) 20 MG tablet Take 20 mg by mouth daily. 06/14/20   [provider]  ? ? ?Physical Exam: ?Vitals:  ? 11/27/21 1600 11/27/21 1700 11/27/21 1710  ?BP: (!) 152/91 (!) 167/88   ?Pulse: (!) 58 (!) 51   ?Resp: 19 (!) 29   ?Temp: 97.8 ?F (36.6 ?C)    ?TempSrc: Oral    ?SpO2: 100% 100%   ?Weight:   83.5 kg  ?Height:   6' (1.829 m)  ? ? ?Constitutional: NAD, calm, comfortable ?Vitals:  ? 11/27/21 1600 11/27/21 1700 11/27/21 1710  ?BP: (!) 152/91 (!) 167/88   ?Pulse: (!) 58 (!) 51   ?Resp: 19 (!) 29   ?Temp: 97.8 ?F (36.6 ?C)    ?TempSrc: Oral    ?SpO2: 100% 100%   ?Weight:   83.5 kg  ?Height:   6' (1.829 m)  ? ?Eyes: PERRL, lids and conjunctivae normal ?ENMT: Mucous membranes are moist. Posterior pharynx clear of any exudate  or lesions.Normal dentition.  ?Neck: normal, supple, no masses, no thyromegaly ?Respiratory: clear to auscultation bilaterally, diffused wheezing, no crackles. Normal respiratory effort. No accessory muscle use.  ?Cardiovascular: Regular rate and rhythm, no murmurs / rubs / gallops. No extremity edema. 2+ pedal pulses. No carotid bruits.  ?Abdomen: no tenderness, no masses palpated. No hepatosplenomegaly. Bowel sounds positive.  ?Musculoskeletal: no clubbing / cyanosis. No joint deformity upper and lower extremities. Good ROM, no contractures. Normal muscle tone.  ?Skin: no rashes, lesions, ulcers. No induration ?Neurologic: CN 2-12 grossly intact. Sensation intact, DTR normal. Strength 5/5 in all 4.  ?Psychiatric: Normal judgment and insight. Alert and oriented x 3. Normal mood.  ? ? ? ?Labs on Admission: I have personally reviewed following labs and imaging studies ? ?CBC: ?Recent Labs  ?Lab 11/27/21 ?1702  ?WBC 8.3  ?NEUTROABS 5.9  ?  HGB 13.1  ?HCT 42.8  ?MCV 85.1  ?PLT 276  ? ?Basic Metabolic Panel: ?Recent Labs  ?Lab 11/27/21 ?1702  ?NA 137  ?K 3.5  ?CL 104  ?CO2 24  ?GLUCOSE 105*  ?BUN 23  ?CREATININE 1.39*  ?CALCIUM 9.9  ? ?GFR: ?Estimated Creatinine Clearance: 48.1 mL/min (A) (by C-G formula based on SCr of 1.39 mg/dL (H)). ?Liver Function Tests: ?Recent Labs  ?Lab 11/27/21 ?1702  ?AST 71*  ?ALT 38  ?ALKPHOS 45  ?BILITOT 1.7*  ?PROT 7.8  ?ALBUMIN 3.7  ? ?No results for input(s): LIPASE, AMYLASE in the last 168 hours. ?No results for input(s): AMMONIA in the last 168 hours. ?Coagulation Profile: ?Recent Labs  ?Lab 11/27/21 ?1702  ?INR 1.1  ? ?Cardiac Enzymes: ?No results for input(s): CKTOTAL, CKMB, CKMBINDEX, TROPONINI in the last 168 hours. ?BNP (last 3 results) ?No results for input(s): PROBNP in the last 8760 hours. ?HbA1C: ?No results for input(s): HGBA1C in the last 72 hours. ?CBG: ?No results for input(s): GLUCAP in the last 168 hours. ?Lipid Profile: ?No results for input(s): CHOL, HDL, LDLCALC, TRIG,  CHOLHDL, LDLDIRECT in the last 72 hours. ?Thyroid Function Tests: ?No results for input(s): TSH, T4TOTAL, FREET4, T3FREE, THYROIDAB in the last 72 hours. ?Anemia Panel: ?No results for input(s): VITAMINB1

## 2021-11-27 NOTE — ED Notes (Signed)
Called and updated, [name], pt's daughter ?

## 2021-11-27 NOTE — ED Provider Notes (Signed)
? ?[facility] ?Provider Note ? ? ? Event Date/Time  ? First MD Initiated Contact with Patient 11/27/21 1617   ?  (approximate) ? ?History  ? ?Chief Complaint: Fall ? ?HPI ? ?Keith Levy is a 81 y.o. male with a past medical history of bipolar, CKD, hyperlipidemia, presents to the emergency department for a CVA.  According to the patient and daughter over the past 5 days the patient has been experiencing multiple falls which is atypical for the patient.  He was seen by his doctor and ordered an outpatient head CT that was performed today showing acute or subacute infarct in the patient was sent immediately to the emergency department. ?Patient does not know much of his medical history appears to be on Eliquis for unknown reasons.  I reviewed the patient's last PCP note 08/12/2021, shows a history of peripheral artery disease but I do not see any history of atrial fibrillation listed. ? ?Physical Exam  ? ?Triage Vital Signs: ?ED Triage Vitals  ?Enc Vitals Group  ?   BP 11/27/21 1600 (!) 152/91  ?   Pulse Rate 11/27/21 1600 (!) 58  ?   Resp 11/27/21 1600 19  ?   Temp 11/27/21 1600 97.8 ?F (36.6 ?C)  ?   Temp Source 11/27/21 1600 Oral  ?   SpO2 11/27/21 1600 100 %  ?   Weight 11/27/21 1710 184 lb (83.5 kg)  ?   Height 11/27/21 1710 6' (1.829 m)  ?   Head Circumference --   ?   Peak Flow --   ?   Pain Score 11/27/21 1558 5  ?   Pain Loc --   ?   Pain Edu? --   ?   Excl. in [place]? --   ? ? ?Most recent vital signs: ?Vitals:  ? 11/27/21 1600 11/27/21 1700  ?BP: (!) 152/91 (!) 167/88  ?Pulse: (!) 58 (!) 51  ?Resp: 19 (!) 29  ?Temp: 97.8 ?F (36.6 ?C)   ?SpO2: 100% 100%  ? ? ?General: Awake, no distress.  ?CV:  Good peripheral perfusion.  Regular rate and rhythm  ?Resp:  Normal effort.  Equal breath sounds bilaterally.  ?Abd:  No distention.  Soft, nontender.  No rebound or guarding. ?Other:  5/5 strength in all extremities.  Equal grip strength bilaterally.  No pronator drift.  No obvious cranial  nerve deficit. ? ? ?ED Results / Procedures / Treatments  ? ?EKG ? ?EKG viewed and interpreted by myself shows atrial fibrillation at 59 bpm with a narrow QRS, normal axis, normal intervals, nonspecific but no concerning ST changes ? ?RADIOLOGY ? ?Outpatient head CT viewed by myself appears to show a right-sided MCA territory infarct. ?Radiology is read the head CT: ?IMPRESSION: ?1. 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 No hemorrhage. ?2. 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 ? ?MEDICATIONS ORDERED IN ED: ?Medications  ?sodium chloride flush (NS) 0.9 % injection 3 mL (3 mLs Intravenous Given 11/27/21 1709)  ? ? ? ?IMPRESSION / MDM / ASSESSMENT AND PLAN / ED COURSE  ?I reviewed the triage vital signs and the nursing notes. ? ?Patient presents emergency department for increased falls over the past 5 days appears to have a right MCA territory infarct no history of infarct previously.  Patient is on Eliquis but no sign of any intracranial hemorrhage.  Patient appears to be in atrial fibrillation, I reviewed the patient's  last EKG 05/22/2021 which also shows atrial fibrillation.  This was not documented by the PCP in their notes however Eliquis could possibly be for atrial fibrillation versus peripheral artery disease.  Given the new stroke we will admit to the hospital service for stroke work-up including MRI duplex ultrasounds and echocardiogram. ? ?Patient CBC shows no concerning findings.  Normal white blood cell count.  Chemistry is resulted largely within normal limits/baseline.  Mild renal insufficiency is largely unchanged from historical values.  Given the acute for subacute CVA on outpatient CT I did speak to Dr. Quinn Axe of neurology as well.  We will continue Eliquis.  Patient will be admitted to the hospital service for further work-up and treatment. ? ? ?FINAL CLINICAL IMPRESSION(S) / ED  DIAGNOSES  ? ?CVA ? ? ? ?Note:  This document was prepared using Dragon voice recognition software and may include unintentional dictation errors. ?  ?Harvest Dark, MD ?11/27/21 1812 ? ?

## 2021-11-28 ENCOUNTER — Inpatient Hospital Stay: Payer: Medicare Other

## 2021-11-28 ENCOUNTER — Inpatient Hospital Stay
Admit: 2021-11-28 | Discharge: 2021-11-28 | Disposition: A | Discharge status: Home / Self Care | Payer: Medicare Other | Attending: Internal Medicine | Admitting: Internal Medicine

## 2021-11-28 DIAGNOSIS — Z7189 Other specified counseling: Secondary | ICD-10-CM

## 2021-11-28 DIAGNOSIS — I70229 Atherosclerosis of native arteries of extremities with rest pain, unspecified extremity: Secondary | ICD-10-CM | POA: Diagnosis not present

## 2021-11-28 DIAGNOSIS — I639 Cerebral infarction, unspecified: Secondary | ICD-10-CM | POA: Diagnosis not present

## 2021-11-28 DIAGNOSIS — J9601 Acute respiratory failure with hypoxia: Secondary | ICD-10-CM | POA: Diagnosis present

## 2021-11-28 DIAGNOSIS — F319 Bipolar disorder, unspecified: Secondary | ICD-10-CM | POA: Diagnosis not present

## 2021-11-28 LAB — LIPID PANEL
Cholesterol: 113 mg/dL (ref 0–200)
HDL: 27 mg/dL — ABNORMAL LOW (ref >40)
LDL Cholesterol: 73 mg/dL (ref 0–99)
Total CHOL/HDL Ratio: 4.2 RATIO
Triglycerides: 64 mg/dL (ref <150)
VLDL: 13 mg/dL (ref 0–40)

## 2021-11-28 LAB — HIV ANTIBODY (ROUTINE TESTING W REFLEX): HIV Screen 4th Generation wRfx: NONREACTIVE

## 2021-11-28 IMAGING — CT CT ANGIO HEAD-NECK (W OR W/O PERF)
1 of 11 series · 5 of 33 positions shown · IV contrast (APPLIED)
Comparison: CT head and MRI/MRA from [DATE].

CLINICAL DATA: Neuro deficit, acute, stroke suspected

EXAM:
CT ANGIOGRAPHY HEAD AND NECK
TECHNIQUE: Multidetector CT imaging of the head and neck was performed using
the standard protocol during bolus administration of intravenous
contrast. Multiplanar CT image reconstructions and MIPs were
obtained to evaluate the vascular anatomy. Carotid stenosis
measurements (when applicable) are obtained utilizing NASCET
criteria, using the distal internal carotid diameter as the
denominator.

[Series 10: ax thin · axial · 0.51mm/px · z∈[-342,-105]mm · 5 of 359 slices shown]
[im 60/359  soft-tissue]
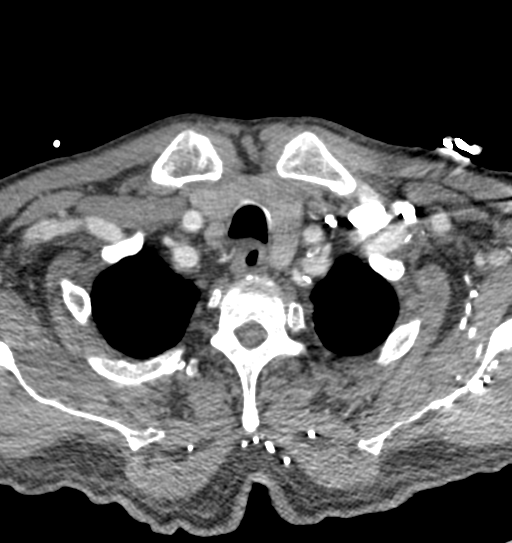
[im 120/359  bone]
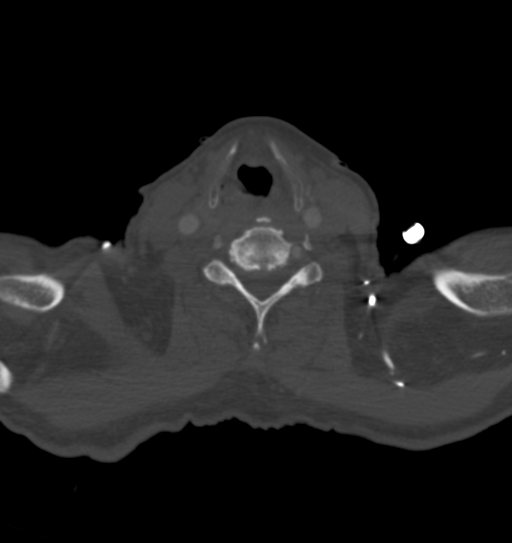
[im 180/359  soft-tissue]
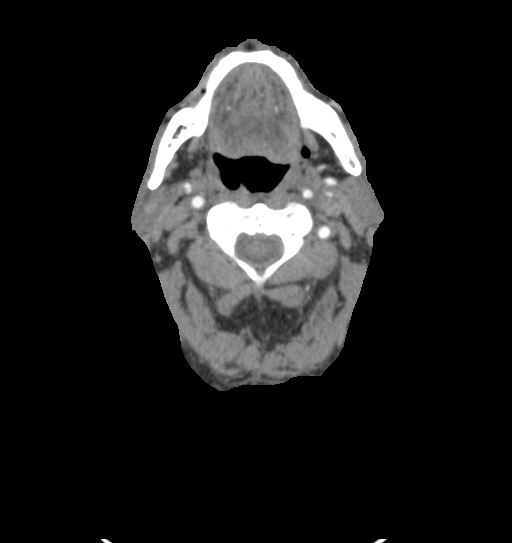
[im 239/359  bone]
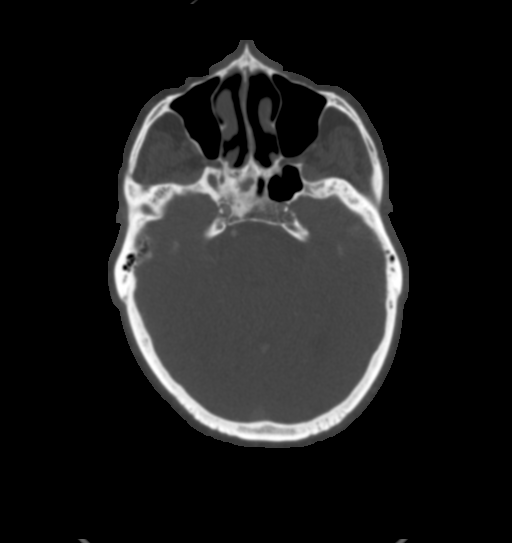
[im 299/359  soft-tissue]
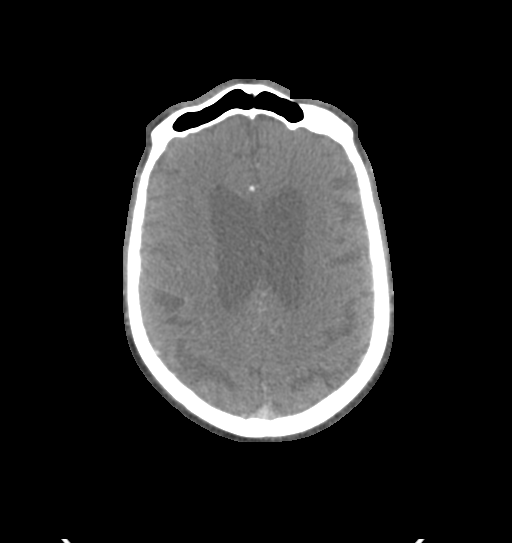

[5 of 33 positions shown; findings below may reference images not displayed]

RADIATION DOSE REDUCTION: This exam was performed according to the
departmental dose-optimization program which includes automated
exposure control, adjustment of the mA and/or kV according to
patient size and/or use of iterative reconstruction technique.

CONTRAST:  75mL OMNIPAQUE IOHEXOL 350 MG/ML SOLN
FINDINGS: CT HEAD FINDINGS

Brain: No substantial change in appearance of right MCA territory
acute/subacute infarcts with associated edema. No substantial mass
effect. No midline shift. No evidence of acute mass occupying
hemorrhage. No hydrocephalus. Redemonstrated left tentorial
meningioma.

Vascular: See below.

Skull: No acute fracture.

Sinuses: Air-fluid level in the right sphenoid sinus.

Orbits: No acute finding.

Review of the MIP images confirms the above findings

CTA NECK FINDINGS

Aortic arch: Aortic atherosclerosis. Approximately 60% stenosis of
the common carotid artery origin due to atherosclerosis. Great
vessel origins are patent.

Right carotid system: Predominantly calcific atherosclerosis at the
carotid bifurcation with approximately 50% stenosis of the ICA
origin.

Left carotid system: Approximately 60% stenosis of the common
carotid artery origin. Predominately calcific atherosclerosis at the
carotid bifurcation with approximately 40% stenosis of the ICA
origin.

Vertebral arteries: Occlusion of the right vertebral artery as
origin with remainder of the right vertebral artery the
non-opacified in the neck. Left vertebral artery is patent with
severe narrowing at its origin.

Skeleton: Moderate multilevel degenerative disc disease.

Other neck: No evidence of acute abnormality on limited assessment.

Upper chest: Visualized lung apices are clear.  Emphysema.

Review of the MIP images confirms the above findings

CTA HEAD FINDINGS

Anterior circulation: Calcific atherosclerosis of bilateral
intracranial ICAs with moderate bilateral paraclinoid ICA stenosis.
Bilateral M1 MCAs are patent without significant stenosis proximal
M2 MCA branches are patent bilaterally. Attenuated right MCA vessels
distally in the region of known infarct. Small left A1 ACA, probably
congenital given prominent right A1 ACA and partially azygous ACA,
anatomic variant.

Posterior circulation: Some opacification of the small intradural
right vertebral artery likely from collateral flow. Moderate to
severe stenosis of the left intradural vertebral artery due to
atherosclerosis. Basilar artery is patent without significant
stenosis. Bilateral posterior cerebral arteries are patent without
proximal hemodynamically significant stenosis. No aneurysm
identified.

Venous sinuses: As permitted by contrast timing, patent.

Anatomic variants: Detailed above.

Review of the MIP images confirms the above findings
IMPRESSION: CTA head:

1. No large vessel occlusion intracranially. Attenuated right MCA
branch vessels distally in the region of known infarct.
2. Moderate bilateral paraclinoid ICA stenosis.
3. Moderate to severe stenosis of the dominant intradural left
vertebral artery.
4. Some opacification of the small intradural right vertebral artery
likely from collateral flow given occlusion in the neck, detailed
below.

CTA neck:

1. Occluded right vertebral artery origin with non-opacification in
the neck.
2. Severe stenosis of the left vertebral artery origin.
3. Approximately 60% stenosis of the left common carotid artery
origin.
4. Bilateral carotid bifurcation atherosclerosis with approximately
50% right and 40% left ICA origin stenosis.
5. Aortic Atherosclerosis ([86]-[86]) and Emphysema ([86]-[86]).

## 2021-11-28 MED ORDER — IPRATROPIUM-ALBUTEROL 0.5-2.5 (3) MG/3ML IN SOLN
3.0000 mL | Freq: Three times a day (TID) | RESPIRATORY_TRACT | Status: DC
  Administered 2021-11-28 – 2021-11-30 (×5): 3 mL via RESPIRATORY_TRACT
  Filled 2021-11-28 (×5): qty 3

## 2021-11-28 MED ORDER — BUDESONIDE 0.5 MG/2ML IN SUSP
0.5000 mg | Freq: Two times a day (BID) | RESPIRATORY_TRACT | Status: DC
  Administered 2021-11-28 – 2021-12-01 (×6): 0.5 mg via RESPIRATORY_TRACT
  Filled 2021-11-28 (×6): qty 2

## 2021-11-28 MED ORDER — IOHEXOL 350 MG/ML SOLN
75.0000 mL | Freq: Once | INTRAVENOUS | Status: AC | PRN
  Administered 2021-11-28: 75 mL via INTRAVENOUS

## 2021-11-28 MED ORDER — ENSURE ENLIVE PO LIQD
237.0000 mL | Freq: Two times a day (BID) | ORAL | Status: DC
  Administered 2021-11-29 (×2): 237 mL via ORAL

## 2021-11-28 MED ORDER — ASPIRIN 81 MG PO CHEW
81.0000 mg | CHEWABLE_TABLET | Freq: Every day | ORAL | Status: DC
  Administered 2021-11-28 – 2021-11-30 (×3): 81 mg via ORAL
  Filled 2021-11-28 (×3): qty 1

## 2021-11-28 NOTE — Consult Note (Addendum)
? ?                                                                                ?Consultation Note ?Date: 11/28/2021  ? ?Patient Name: Keith Levy  ?DOB: 1941-01-15  MRN: [id number]  Age / Sex: 81 y.o., male  ?PCP: Rusty Aus, MD ?Referring Physician: Max Sane, MD ? ?Reason for Consultation: Establishing goals of care ? ?HPI/Patient Profile: 81 y.o. male with medical history significant of PAD, chronic A-fib on Eliquis, CKD stage IIIa, HLD, question of COPD, bipolar disorder, cigar smoker, alcohol abuse, came with frequent falls and loss of balance. ? ?Clinical Assessment and Goals of Care: ? ?Notes and labs reviewed.  Patient is resting in bed, he can answer some general questions but unable to participate in goals of care discussion.  No family at bedside.  ? ?Called patient's daughter who is listed as H POA.  Those documents have been scanned into the system.  She states that she has a sister.  She tells me that she was raised by her mother, and saw him very infrequently.  She tells me that she has decided to help navigate his healthcare and home life. She tells me that the patient was widowed around 4 years ago, and his wife died from end-stage dementia.  She discusses that her father lives alone however has someone to come in and help with cooking and medication.  She states as of lately he has not been happy about having someone come in to help as he feels that he can do these things himself. ? ?She states over the past month he has come to a place where he forgets to take the trash out and do other things around the house that he has been doing for years.  She tells me that he has issues with using his [employer], but does also add that is challenging for her as well.  She tells me that she notices that he does not keep food in the refrigerator.  Daughter tells me that prior to this admission she and her sister have been on the cusp of placing patient in a facility. ? ?She discusses waiting to see  what neurology has to say, and then making decisions from there. She does confirm do not resuscitate and do not intubate or place on ventilator.  ? ?  PMT will continue to follow. ? ?I completed a MOST form today with daughter who is HPOA through [place]. A photocopy was placed in the chart to be scanned into EMR. The patient outlined their wishes for the following treatment decisions: ? ?Cardiopulmonary Resuscitation: Do Not Attempt Resuscitation (DNR/No CPR)  ?Medical Interventions: Limited Additional Interventions: Use medical treatment, IV fluids and cardiac monitoring as indicated, DO NOT USE intubation or mechanical ventilation. May consider use of less invasive airway support such as BiPAP or CPAP. Also provide comfort measures. Transfer to the hospital if indicated. Avoid intensive care.   ?Antibiotics: Antibiotics if indicated  ?IV Fluids: IV fluids if indicated  ?Feeding Tube: No feeding tube  ?  ? ? ? ?SUMMARY OF RECOMMENDATIONS   ?Daughter is waiting to speak to neurology and collect further information before  making any decisions. ?PMT will continue to follow ? ?Prognosis:  ?Poor overall ? ?  ? ?Primary Diagnoses: ?Present on Admission: ? Stroke (cerebrum) ([place]) ? COPD with acute exacerbation ([place]) ? Malnutrition of moderate degree ? (Resolved) Atherosclerosis of native arteries of extremity with rest pain ([place]) ? CKD (chronic kidney disease) stage 3, GFR 30-59 ml/min (HCC) ? Centrilobular emphysema ([place]) ? Bipolar 1 disorder ([place]) ? Atherosclerosis of artery of extremity with rest pain ([place]) ? Acute respiratory failure with hypoxia ([place]) ? ? ?I have reviewed the medical record, interviewed the patient and family, and examined the patient. The following aspects are pertinent. ? ?Past Medical History:  ?Diagnosis Date  ? Bipolar disorder ([place])   ? CKD (chronic kidney disease), stage III ([place])   ? Hyperlipidemia   ? PAD (peripheral artery disease) ([place])   ? Pre-diabetes   ? ?Social History  ? ?Socioeconomic  History  ? Marital status: Married  ?  Spouse name: Not on file  ? Number of children: Not on file  ? Years of education: Not on file  ? Highest education level: Not on file  ?Occupational History  ? Not on file  ?Tobacco Use  ? Smoking status: Former  ?  Packs/day: 1.00  ?  Types: Cigarettes  ?  Quit date: 05/12/2021  ?  Years since quitting: 0.5  ? Smokeless tobacco: Never  ?Vaping Use  ? Vaping Use: Never used  ?Substance and Sexual Activity  ? Alcohol use: Yes  ?  Alcohol/week: 8.0 standard drinks  ?  Types: 6 Cans of beer, 2 Shots of liquor per week  ?  Comment: 6 pack a week and couple mixed drinks/week  ? Drug use: Never  ? Sexual activity: Not on file  ?Other Topics Concern  ? Not on file  ?Social History Narrative  ? Not on file  ? ?Social Determinants of Health  ? ?Financial Resource Strain: Not on file  ?Food Insecurity: Not on file  ?Transportation Needs: Not on file  ?Physical Activity: Not on file  ?Stress: Not on file  ?Social Connections: Not on file  ? ?History reviewed. No pertinent family history. ?Scheduled Meds: ? aspirin  81 mg Oral Daily  ? azithromycin  500 mg Oral Daily  ? budesonide (PULMICORT) nebulizer solution  0.5 mg Nebulization Q12H  ? feeding supplement  237 mL Oral TID BM  ? folic acid  1 mg Oral Daily  ? ipratropium-albuterol  3 mL Nebulization TID  ? lithium carbonate  300 mg Oral BID  ? mometasone-formoterol  2 puff Inhalation BID  ? multivitamin with minerals  1 tablet Oral Daily  ? PARoxetine  20 mg Oral Daily  ? predniSONE  40 mg Oral Q breakfast  ? QUEtiapine  200 mg Oral QHS  ? simvastatin  20 mg Oral QHS  ? thiamine  100 mg Oral Daily  ? Or  ? thiamine  100 mg Intravenous Daily  ? ?Continuous Infusions: ?PRN Meds:.acetaminophen **OR** acetaminophen (TYLENOL) oral liquid 160 mg/5 mL **OR** acetaminophen, albuterol, hydrALAZINE, LORazepam **OR** LORazepam, meclizine, oxyCODONE-acetaminophen, senna-docusate ?Medications Prior to Admission:  ?Prior to Admission medications    ?Medication Sig Start Date End Date Taking? Authorizing Provider  ?aspirin 81 MG EC tablet Take 81 mg by mouth daily.   Yes [provider]  ?clopidogrel (PLAVIX) 75 MG tablet Take 75 mg by mouth daily. 10/20/21  Yes [provider]  ?ELIQUIS 2.5 MG TABS tablet Take 2.5 mg by mouth 2 (two) times daily.  11/27/21  Yes [provider]  ?furosemide (LASIX) 20 MG tablet Take 20 mg by mouth daily. 11/06/20 11/27/21 Yes [provider]  ?lithium carbonate 300 MG capsule Take 300 mg by mouth 2 (two) times daily. 09/24/20  Yes [provider]  ?Multiple Vitamin (MULTIVITAMIN WITH MINERALS) TABS tablet Take 1 tablet by mouth daily. 05/29/21  Yes Stegmayer, Janalyn Harder, PA-C  ?PARoxetine (PAXIL) 20 MG tablet Take 20 mg by mouth daily. 06/11/20  Yes [provider]  ?QUEtiapine (SEROQUEL) 200 MG tablet Take 200 mg by mouth at bedtime. 11/04/19  Yes [provider]  ?QUEtiapine (SEROQUEL) 50 MG tablet Take 50 mg by mouth at bedtime.   Yes [provider]  ?simvastatin (ZOCOR) 20 MG tablet Take 20 mg by mouth daily. 06/14/20  Yes [provider]  ?amoxicillin-clavulanate (AUGMENTIN) 500-125 MG tablet Take 1 tablet by mouth 3 (three) times daily. ?Patient not taking: Reported on 11/27/2021 06/21/21   [provider]  ?feeding supplement (ENSURE ENLIVE / ENSURE PLUS) LIQD Take 237 mLs by mouth 3 (three) times daily between meals. 05/28/21   Stegmayer, Janalyn Harder, PA-C  ?oxyCODONE-acetaminophen (PERCOCET/ROXICET) 5-325 MG tablet Take 1-2 tablets by mouth every 4 (four) hours as needed for moderate pain. ?Patient not taking: Reported on 11/27/2021 05/29/21   Sela Hua, PA-C  ? ?No Known Allergies ?Review of Systems  ?All other systems reviewed and are negative. ? ?Physical Exam ?Pulmonary:  ?   Effort: Pulmonary effort is normal.  ?Neurological:  ?   Mental Status: He is alert.  ? ? ?Vital Signs: BP (!) 141/69 (BP Location: Left Arm)   Pulse 61    Temp 97.6 ?F (36.4 ?C) (Oral)   Resp 15   Ht 6' (1.829 m)   Wt 83.5 kg   SpO2 98%   BMI 24.95 kg/m?  ?Pain Scale: 0-10 ?  ?Pain Score: 0-No pain ? ? ?SpO2: SpO2: 98 % ?O2 Device:SpO2: 98 % ?O2 Flow Rate:

## 2021-11-28 NOTE — Progress Notes (Signed)
?  Progress Note ? ? ?PatientTyjuan Levy NFA:[id number] DOB: [birth year] DOA: 11/27/2021     1 ?DOS: the patient was seen and examined on 11/28/2021 ?  ?Brief hospital course: ?81 y.o. male with medical history significant of PAD, chronic A-fib on Eliquis, CKD stage IIIa, HLD, question of COPD, bipolar disorder, cigar smoker, alcohol abuse, came with frequent falls and loss of balance. ? ?3/16 -PT, OT, ST and neuro consult.  Getting CTA head and neck ? ? ?Assessment and Plan: ?* Stroke (cerebrum) ([place]) ?May be smaller acute infarct in the right parietal lobe.  Subacute/large stroke seen on MRI as well.  Neurology getting CT angio head and neck today for further evaluation.  Hold Eliquis for next 2 days per neurology.  Continue aspirin for now.  Continue statin ? ?PT, OT, ST eval ?Palliative care for goals of care discussion.  Based on my talk with daughter he has been clinically declining over the last 1 year with multiple falls and behavioral disturbances ? ?Acute respiratory failure with hypoxia ([place]) ?Requiring 2 L oxygen via nasal cannula.  This is likely due to mild COPD exacerbation.  Continue COPD regimen as ordered.  Wean oxygen as able ? ?COPD with acute exacerbation ([place]) ?Requiring 2 L oxygen via nasal cannula.  Continue COPD regimen as ordered ? ?Malnutrition of moderate degree ?Overall poor prognosis. ? ?Atherosclerosis of artery of extremity with rest pain ([place]) ?Continue aspirin and statin ? ?CKD (chronic kidney disease) stage 3, GFR 30-59 ml/min (HCC) ?At baseline.  Avoid nephrotoxic medication ? ?Centrilobular emphysema ([place]) ?Continue albuterol nebs as needed.  Budesonide, Dulera, prednisone and Zithromax.  Wean oxygen as able ? ?Bipolar 1 disorder ([place]) ?Continue Seroquel ? ? ? ? ?  ? ?Subjective: Seems pleasantly confused.  Unable to provide much history.  He cannot tell me why he is here ? ?Physical Exam: ?Vitals:  ? 11/28/21 0002 11/28/21 0512 11/28/21 0830 11/28/21 0833  ?BP: (!) 153/63  (!)  141/69   ?Pulse:   60 61  ?Resp: '18  15 15  '$ ?Temp:   97.6 ?F (36.4 ?C)   ?TempSrc:   Oral   ?SpO2:  98% 100% 98%  ?Weight:      ?Height:      ? ?81 year old male lying in the bed pleasantly confused in no acute distress ?Eyes: PERRL, lids and conjunctivae normal ?ENMT: Mucous membranes are moist. Posterior pharynx clear of any exudate or lesions.Normal dentition.  ?Neck: normal, supple, no masses, no thyromegaly ?Respiratory: mild diffused expiratory wheezing, no crackles. Normal respiratory effort. No accessory muscle use.  ?Cardiovascular: Regular rate and rhythm, no murmurs / rubs / gallops. No extremity edema ?Abdomen:  Soft, benign ?Skin: no rashes, lesions, ulcers. No induration ?Neurologic:  Speaks slowly and in single words/short phrases.  Nonfocal.  Awake but confused ?Psychiatric:  Flat affect ? ? ?Data Reviewed: ? ?Abnormal MRI of the brain and CT head suggestive of stroke as reported in the radiology ? ?Family Communication: Daughter [name] is updated over phone [phone] ? ?Disposition: ?Status is: Inpatient ?Remains inpatient appropriate because: Getting stroke work-up and neurology consultation ? ? Planned Discharge Destination: Rehab ? ? ? DVT prophylaxis-SCD ? ?Time spent: 35 minutes ? ?Author: ?Max Sane, MD ?11/28/2021 1:19 PM ? ?For on call review [email or web address].  ?

## 2021-11-28 NOTE — Assessment & Plan Note (Addendum)
May be smaller acute infarct in the right parietal lobe.  Subacute/large stroke seen on MRI as well.  Neurology getting CT angio head and neck today for further evaluation.  Hold Eliquis for next 2 days per neurology.  Continue aspirin for now.  Continue statin ? ?PT, OT, ST eval ?Palliative care for goals of care discussion.  Based on my talk with daughter he has been clinically declining over the last 1 year with multiple falls and behavioral disturbances ?

## 2021-11-28 NOTE — Assessment & Plan Note (Signed)
Requiring 2 L oxygen via nasal cannula.  Continue COPD regimen as ordered ?

## 2021-11-28 NOTE — Progress Notes (Signed)
Dr Manuella Ghazi and Ward Chatters RN made aware that per tele monitoring Pt has been running afib upper 30-40s all day but just had a dip to 27, now back to 40, acknowledged  ?

## 2021-11-28 NOTE — TOC Initial Note (Signed)
Transition of Care (TOC) - Initial/Assessment Note  ? ? ?Patient Details  ?Name: Keith Levy ?MRN: [id number] ?Date of Birth: Aug 19, 1941 ? ?Transition of Care (TOC) CM/SW Contact:    ?Pete Pelt, RN ?Phone Number: ?11/28/2021, 2:28 PM ? ?Clinical Narrative:  Spoke to daughter [name] on the phone as patient was unable to interview due to level of consciousness.  [name] states that patient lives alone.  She has a private pay housekeeper coming in to see patient to assist. ? ?Daughter explains that due to patient's current status, she does not feel comfortable having him return home.  Discussed SNF placement, and daughter stated this is a possibility and if that is the final decision she would like him placed around the [facility] area. ? ?Daughter explained that she just spoke to care team, including palliative care.  There were additional findings on patient's CT scan that she would like to discuss in further detail after patient sees specialists to further evaluate.  She would like to hold on rehab until tomorrow, when she has further details. She will be at bedside tomorrow, and requests that [facility] meet with her after her meeting with palliative care. ? ?TOC contact information provided, TOC to follow              ? ? ?Expected Discharge Plan: [place] ?Barriers to Discharge: Continued Medical Work up ? ? ?Patient Goals and CMS Choice ?  ?  ?  ? ?Expected Discharge Plan and Services ?Expected Discharge Plan: [place] ?  ?Discharge Planning Services: CM Consult ?  ?Living arrangements for the past 2 months: [place] ?                ?  ?  ?  ?  ?  ?  ?  ?  ?  ?  ? ?Prior Living Arrangements/Services ?Living arrangements for the past 2 months: [place] ?Lives with:: Self ?Patient language and need for interpreter reviewed:: Yes ?Do you feel safe going back to the place where you live?: No   Patient will need rehab or other post hospital care  ?Need for Family  Participation in Patient Care: Yes (Comment) ?Care giver support system in place?: Yes (comment) ?  ?Criminal Activity/Legal Involvement Pertinent to Current Situation/Hospitalization: No - Comment as needed ? ?Activities of Daily Living ?Home Assistive Devices/Equipment: Gilford Rile (specify type) ?ADL Screening (condition at time of admission) ?Patient's cognitive ability adequate to safely complete daily activities?: Yes ?Is the patient deaf or have difficulty hearing?: No ?Does the patient have difficulty seeing, even when wearing glasses/contacts?: No ?Does the patient have difficulty concentrating, remembering, or making decisions?: Yes ?Patient able to express need for assistance with ADLs?: Yes ?Independently performs ADLs?: Yes (appropriate for developmental age) ?Does the patient have difficulty walking or climbing stairs?: Yes ?Weakness of Legs: Right ?Weakness of Arms/Hands: Left ? ?Permission Sought/Granted ?Permission sought to share information with : Case Freight forwarder, Customer service manager ?  ?   ? Permission granted to share info w AGENCY: Facility for potential placement ?   ?   ? ?Emotional Assessment ?Appearance:: Appears stated age ?Attitude/Demeanor/Rapport:  (Spoke with daughter on the phone as patient is not alert and oriented) ?  ?Orientation: : Oriented to Self ?Alcohol / Substance Use: Not Applicable ?Psych Involvement: No (comment) ? ?Admission diagnosis:  Stroke (cerebrum) ([place]) [I63.9] ?Hypoxia [R09.02] ?Cerebrovascular accident (CVA), unspecified mechanism ([place]) [I63.9] ?Patient Active Problem List  ? Diagnosis Date Noted  ?  Acute respiratory failure with hypoxia ([place]) 11/28/2021  ? Stroke (cerebrum) ([place]) 11/27/2021  ? COPD with acute exacerbation ([place]) 11/27/2021  ? Malnutrition of moderate degree 05/24/2021  ? Atherosclerosis of artery of extremity with rest pain ([place]) 05/22/2021  ? Tobacco use disorder 05/10/2021  ? Chronic alcohol abuse 11/13/2020  ? Atherosclerosis of native  arteries of extremity with intermittent claudication ([place]) 10/16/2020  ? CKD (chronic kidney disease) stage 3, GFR 30-59 ml/min (HCC) 04/27/2019  ? 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 Centrilobular emphysema ([place]) 04/14/2018  ? Medicare annual wellness visit, initial 04/07/2017  ? 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 Bipolar 1 disorder ([place]) 10/06/2016  ? Tubular adenoma 10/06/2016  ? Combined hyperlipidemia 06/26/2014  ? ?PCP:  Rusty Aus, MD ?Pharmacy:   ?[id number] [id number]?Phone: [phone]: [phone]? ? ? ? ?Social Determinants of Health (SDOH) Interventions ?  ? ?Readmission Risk Interventions ?Readmission Risk Prevention Plan 11/28/2021  ?Transportation Screening Complete  ?PCP or Specialist Appt within 5-7 Days Complete  ?Home Care Screening Complete  ?Medication Review (RN CM) Complete  ?Some recent data might be hidden  ? ? ? ?

## 2021-11-28 NOTE — Assessment & Plan Note (Signed)
Continue albuterol nebs as needed.  Budesonide, Dulera, prednisone and Zithromax.  Wean oxygen as able ?

## 2021-11-28 NOTE — Progress Notes (Signed)
Initial Nutrition Assessment ? ?DOCUMENTATION CODES:  ? ?Not applicable ? ?INTERVENTION:  ? ?-Continue Ensure Enlive po BID, each supplement provides 350 kcal and 20 grams of protein.  ?-MVI with minerals daily ?-Liberalize diet to regular ? ?NUTRITION DIAGNOSIS:  ? ?Increased nutrient needs related to chronic illness (COPD) as evidenced by estimated needs. ? ?GOAL:  ? ?Patient will meet greater than or equal to 90% of their needs ? ?MONITOR:  ? ?PO intake, Supplement acceptance, Labs, Weight trends, Skin, I & O's ? ?REASON FOR ASSESSMENT:  ? ?Consult ?Assessment of nutrition requirement/status ? ?ASSESSMENT:  ? ?Keith Levy is a 81 y.o. male with medical history significant of PAD, chronic A-fib on Eliquis, CKD stage IIIa, HLD, question of COPD, bipolar disorder, cigar smoker, alcohol abuse, came with frequent falls and loss of balance. ? ?Pt admitted with rt MCA stroke.  ? ?Reviewed I/O's: -110 ml x 24 hours  ? ?Pt sleeping soundly at time of visit. He did not arouse to voice to touch.  ? ?Pt currently on a heart healthy diet. Pt consuming 100% of meals.  ?  ?Reviewed wt hx; pt with no wt loss over the past 6 months. Noted mild wt gain, which is favorable given prior malnutrition diagnosis.  ? ?Medications reviewed and include folic acid, thiamine, and prednisone.  ? ?Labs reviewed: CBGS: 134.   ? ?NUTRITION - FOCUSED PHYSICAL EXAM: ? ?Flowsheet Row Most Recent Value  ?Orbital Region No depletion  ?Upper Arm Region Mild depletion  ?Thoracic and Lumbar Region No depletion  ?Buccal Region No depletion  ?Temple Region Mild depletion  ?Clavicle Bone Region Mild depletion  ?Clavicle and Acromion Bone Region No depletion  ?Scapular Bone Region No depletion  ?Dorsal Hand No depletion  ?Patellar Region No depletion  ?Anterior Thigh Region No depletion  ?Posterior Calf Region No depletion  ?Edema (RD Assessment) Mild  ?Hair Reviewed  ?Eyes Reviewed  ?Mouth Reviewed  ?Skin Reviewed  ?Nails Reviewed  ? ?  ? ? ?Diet  Order:   ?Diet Order   ? ?       ?  Diet Heart Room service appropriate? Yes; Fluid consistency: Thin  Diet effective now       ?  ? ?  ?  ? ?  ? ? ?EDUCATION NEEDS:  ? ?No education needs have been identified at this time ? ?Skin:  Skin Assessment: Reviewed RN Assessment ? ?Last BM:  Unknown ? ?Height:  ? ?Ht Readings from Last 1 Encounters:  ?11/27/21 6' (1.829 m)  ? ? ?Weight:  ? ?Wt Readings from Last 1 Encounters:  ?11/27/21 83.5 kg  ? ? ?Ideal Body Weight:  80.9 kg ? ?BMI:  Body mass index is 24.95 kg/m?. ? ?Estimated Nutritional Needs:  ? ?Kcal:  2050-2250 ? ?Protein:  105-120 grams ? ?Fluid:  > 2 L ? ? ? ?Loistine Chance, RD, LDN, CDCES ?Registered Dietitian II ?Certified Diabetes Care and Education Specialist ?Please refer to [facility] for RD and/or RD on-call/weekend/after hours pager  ?

## 2021-11-28 NOTE — Assessment & Plan Note (Signed)
Continue aspirin and statin. 

## 2021-11-28 NOTE — Assessment & Plan Note (Signed)
At baseline.  Avoid nephrotoxic medication ?

## 2021-11-28 NOTE — Assessment & Plan Note (Signed)
Continue Seroquel. 

## 2021-11-28 NOTE — Assessment & Plan Note (Signed)
Overall poor prognosis. ?

## 2021-11-28 NOTE — Progress Notes (Signed)
[place]  ?MRI report called to me by radiology tech.  ?IMPRESSION: ?1. Infarct in the lateral right frontal lobe, favored to be at least ?partially subacute which involves the insula and operculum, ?consistent with the anterior right MCA territory infarct seen on the ?same-day CT head. Additional focal acute infarct in the right ?parietal lobe may suggest an embolic etiology, given slightly ?different vascular supply. ?2. 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 ?3. Non opacification of the proximal intracranial right vertebral ?artery. No other intracranial large vessel occlusion. ?4. Multifocal narrowing in the anterior greater than posterior right ?MCA branches, left MCA branches, and left A1/A2. Some of this may be ?artifactual, secondary to motion. ?5. The neck MRA is of limited utility, given motion and missed ?contrast bolus. Within this limitation, the right vertebral artery ?appears to occluded for the entirety of its course, of indeterminate ?acuity. Remaining neck vasculature appears patent, although ?evaluation of the bifurcations is particularly limited. If there is ?clinical indication for evaluation of the carotid bifurcations or ?right vertebral artery, a CTA of the head and neck is recommended. ? ?Patient already on asa, plavix and statin. Also on eliquis. Therapy evaluation also already ordered ?

## 2021-11-28 NOTE — Consult Note (Addendum)
NEUROLOGY CONSULTATION NOTE  ? ?Date of service: November 28, 2021 ?Patient Name: Keith Levy ?MRN:  [id number] ?DOB:  [birth year] ?Reason for consult: frequent falls, stroke ?Requesting physician: Dr. Max Sane ?_ _ _   _ __   _ __ _ _  __ __   _ __   __ _ ? ?History of Present Illness  ? ?This is a 81 year old gentleman with past medical history of a fib on eliquis, bipolar disorder, CKD stage III, hyperlipidemia, PAD and prediabetes who is admitted with multiple falls over the past 3 days.  Patient suddenly developed lightheadedness without frank vertigo approximately 3 days ago and he has sustained 4 falls since then.  He did hit his head 1 time although the rest of his time was able to grab onto something and did not have any head injury.  He did not lose consciousness at any point.  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  Patient was in rate controlled A-fib in the ED. ? ?MRI brain showed subacute ischemic infarct right frontal region, acute ischemic infarct right parietal region, and left cerebellar meningioma (personal review). ? ?Stroke workup this admission: ? ?CTA head: ?  ?1. No large vessel occlusion intracranially. Attenuated right MCA ?branch vessels distally in the region of known infarct. ?2. Moderate bilateral paraclinoid ICA stenosis. ?3. Moderate to severe stenosis of the dominant intradural left ?vertebral artery. ?4. Some opacification of the small intradural right vertebral artery ?likely from collateral flow given occlusion in the neck, detailed ?below. ?  ?CTA neck: ?  ?1. Occluded right vertebral artery origin with non-opacification in ?the neck. ?2. Severe stenosis of the left vertebral artery origin. ?3. Approximately 60% stenosis of the left common carotid artery ?origin. ?4. Bilateral carotid bifurcation atherosclerosis with  approximately ?50% right and 40% left ICA origin stenosis. ?5. Aortic Atherosclerosis (ICD10-I70.0) and Emphysema (ICD10-J43.9). ? ?Cerebrovascular imaging personally reviewed; I agree with above interpretation ? ?TTE pending ? ?Stroke Labs ? ?   ?Component Value Date/Time  ? CHOL 113 11/28/2021 0520  ? TRIG 64 11/28/2021 0520  ? HDL 27 (L) 11/28/2021 0520  ? CHOLHDL 4.2 11/28/2021 0520  ? VLDL 13 11/28/2021 0520  ? [place] 73 11/28/2021 0520  ? ? ?No results found for: HGBA1C ? ?  ?ROS  ? ?Per HPI: all other systems reviewed and are negative ? ?Past History  ? ?I have reviewed the following: ? ?Past Medical History:  ?Diagnosis Date  ? Bipolar disorder ([place])   ? CKD (chronic kidney disease), stage III ([place])   ? Hyperlipidemia   ? PAD (peripheral artery disease) ([place])   ? Pre-diabetes   ? ?Past Surgical History:  ?Procedure Laterality Date  ? ENDARTERECTOMY FEMORAL Right 05/22/2021  ? Procedure: RIGHT ENDARTERECTOMY FEMORAL; INSERTION OF SFA AND POPLITEAL STENTS, THROMBECTOMY ILIAC;  Surgeon: Algernon Huxley, MD;  Location: [facility];  Service: Vascular;  Laterality: Right;  ? INSERTION OF ILIAC STENT Bilateral 05/22/2021  ? Procedure: INSERTION OF ILIAC STENT, BILATERAL;  Surgeon: Algernon Huxley, MD;  Location: [facility];  Service: Vascular;  Laterality: Bilateral;  ? LOWER EXTREMITY ANGIOGRAPHY Right 10/22/2020  ? Procedure: LOWER EXTREMITY ANGIOGRAPHY;  Surgeon: Algernon Huxley, MD;  Location: [place] [facility];  Service: Cardiovascular;  Laterality: Right;  ? LOWER EXTREMITY ANGIOGRAPHY Left 11/01/2020  ? Procedure: LOWER  EXTREMITY ANGIOGRAPHY;  Surgeon: Algernon Huxley, MD;  Location: [place] [facility];  Service: Cardiovascular;  Laterality: Left;  ? LOWER EXTREMITY ANGIOGRAPHY Right 12/06/2020  ? Procedure: LOWER EXTREMITY ANGIOGRAPHY;  Surgeon: Algernon Huxley, MD;  Location: [place] [facility];  Service: Cardiovascular;  Laterality: Right;  ? LOWER EXTREMITY ANGIOGRAPHY Right 05/16/2021  ? Procedure: LOWER EXTREMITY  ANGIOGRAPHY;  Surgeon: Algernon Huxley, MD;  Location: [place] [facility];  Service: Cardiovascular;  Laterality: Right;  ? VASCULAR SURGERY    ? stents on LLE and RLE  ? ?History reviewed. No pertinent family history. ?Social History  ? ?Socioeconomic History  ? Marital status: Married  ?  Spouse name: Not on file  ? Number of children: Not on file  ? Years of education: Not on file  ? Highest education level: Not on file  ?Occupational History  ? Not on file  ?Tobacco Use  ? Smoking status: Former  ?  Packs/day: 1.00  ?  Types: Cigarettes  ?  Quit date: 05/12/2021  ?  Years since quitting: 0.5  ? Smokeless tobacco: Never  ?Vaping Use  ? Vaping Use: Never used  ?Substance and Sexual Activity  ? Alcohol use: Yes  ?  Alcohol/week: 8.0 standard drinks  ?  Types: 6 Cans of beer, 2 Shots of liquor per week  ?  Comment: 6 pack a week and couple mixed drinks/week  ? Drug use: Never  ? Sexual activity: Not on file  ?Other Topics Concern  ? Not on file  ?Social History Narrative  ? Not on file  ? ?Social Determinants of Health  ? ?Financial Resource Strain: Not on file  ?Food Insecurity: Not on file  ?Transportation Needs: Not on file  ?Physical Activity: Not on file  ?Stress: Not on file  ?Social Connections: Not on file  ? ?No Known Allergies ? ?Medications  ? ?Medications Prior to Admission  ?Medication Sig Dispense Refill Last Dose  ? aspirin 81 MG EC tablet Take 81 mg by mouth daily.   11/27/2021 at 0800  ? clopidogrel (PLAVIX) 75 MG tablet Take 75 mg by mouth daily.   11/27/2021 at 0800  ? ELIQUIS 2.5 MG TABS tablet Take 2.5 mg by mouth 2 (two) times daily.   11/27/2021 at 0800  ? furosemide (LASIX) 20 MG tablet Take 20 mg by mouth daily.   11/27/2021 at 0800  ? lithium carbonate 300 MG capsule Take 300 mg by mouth 2 (two) times daily.   11/27/2021 at 0800  ? Multiple Vitamin (MULTIVITAMIN WITH MINERALS) TABS tablet Take 1 tablet by mouth daily.   11/27/2021 at 0800  ? PARoxetine (PAXIL) 20 MG tablet Take 20 mg by mouth  daily.   11/27/2021 at 0800  ? QUEtiapine (SEROQUEL) 200 MG tablet Take 200 mg by mouth at bedtime.   11/26/2021 at 2000  ? QUEtiapine (SEROQUEL) 50 MG tablet Take 50 mg by mouth at bedtime.   11/26/2021 at 2000  ? simvastatin (ZOCOR) 20 MG tablet Take 20 mg by mouth daily.   11/26/2021 at 2000  ? amoxicillin-clavulanate (AUGMENTIN) 500-125 MG tablet Take 1 tablet by mouth 3 (three) times daily. (Patient not taking: Reported on 11/27/2021)   Not Taking  ? feeding supplement (ENSURE ENLIVE / ENSURE PLUS) LIQD Take 237 mLs by mouth 3 (three) times daily between meals. 237 mL 12   ? oxyCODONE-acetaminophen (PERCOCET/ROXICET) 5-325 MG tablet Take 1-2 tablets by mouth every 4 (four) hours as needed for moderate pain. (Patient  not taking: Reported on 11/27/2021) 50 tablet 0 Not Taking  ?  ? ? ?Current Facility-Administered Medications:  ?  acetaminophen (TYLENOL) tablet 650 mg, 650 mg, Oral, Q4H PRN **OR** acetaminophen (TYLENOL) 160 MG/5ML solution 650 mg, 650 mg, Per Tube, Q4H PRN **OR** acetaminophen (TYLENOL) suppository 650 mg, 650 mg, Rectal, Q4H PRN, Wynetta Fines T, MD ?  albuterol (PROVENTIL) (2.5 MG/3ML) 0.083% nebulizer solution 2.5 mg, 2.5 mg, Nebulization, Q2H PRN, Wynetta Fines T, MD ?  aspirin chewable tablet 81 mg, 81 mg, Oral, Daily, Derek Jack, MD, 81 mg at 11/28/21 0908 ?  [COMPLETED] azithromycin (ZITHROMAX) 500 mg in sodium chloride 0.9 % 250 mL IVPB, 500 mg, Intravenous, Q24H, Stopped at 11/27/21 2040 **FOLLOWED BY** azithromycin (ZITHROMAX) tablet 500 mg, 500 mg, Oral, Daily, Zhang, Ping T, MD ?  budesonide (PULMICORT) nebulizer solution 0.5 mg, 0.5 mg, Nebulization, Q12H, Manuella Ghazi, Vipul, MD ?  feeding supplement (ENSURE ENLIVE / ENSURE PLUS) liquid 237 mL, 237 mL, Oral, TID BM, Wynetta Fines T, MD, 237 mL at 62/70/35 0093 ?  folic acid (FOLVITE) tablet 1 mg, 1 mg, Oral, Daily, Wynetta Fines T, MD, 1 mg at 11/28/21 8182 ?  hydrALAZINE (APRESOLINE) tablet 25 mg, 25 mg, Oral, Q6H PRN, Wynetta Fines T, MD ?   ipratropium-albuterol (DUONEB) 0.5-2.5 (3) MG/3ML nebulizer solution 3 mL, 3 mL, Nebulization, TID, Max Sane, MD ?  lithium carbonate capsule 300 mg, 300 mg, Oral, BID, Wynetta Fines T, MD, 300 mg at 11/28/21 0908 ?  LORazepam

## 2021-11-28 NOTE — Hospital Course (Addendum)
81 y.o. male with medical history significant of PAD, chronic A-fib on Eliquis, CKD stage IIIa, HLD, question of COPD, bipolar disorder, cigar smoker, alcohol abuse, came with frequent falls and loss of balance. ? ?3/16 -PT, OT, ST and neuro consult.  Getting CTA head and neck ?3/17: SNF with palliative care at discharge ?3/18: Alcohol withdrawal, getting CT head and MRI brain for further eval ?3/19-3/20: Full comfort care and hospice home eval pending ?

## 2021-11-28 NOTE — Evaluation (Signed)
Occupational Therapy Evaluation ?Patient Details ?Name: Keith Levy ?MRN: [id number] ?DOB: December 30, 1940 ?Today's Date: 11/28/2021 ? ? ?History of Present Illness Pt is a 81 y.o. male presenting to hospital 3/15.  Per MD note "According to the patient and daughter over the past 5 days the patient has been experiencing multiple falls which is atypical for the patient.  He was seen by his doctor and ordered an outpatient head CT that was performed today showing acute or subacute infarct in the patient was sent immediately to the emergency department.".  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  Pt admitted with R MCA stroke, frequent falls, and acute COPD exacerbation.  PMH includes s/p 9/722 R femoral endarterectomy, R iliac thrombectomy, insertion of SFA and popliteal stents, and insertion of iliac stent B; bipolar disorder, HLD, PAD, LE angio, stents B LE's, a-fib, and COPD.  ? ?Clinical Impression ?  ?Patient presenting with decreased Ind in self care, balance, functional mobility/transfers, endurance, and safety awareness. Patient reports living at home alone with use of SPC in community. Pt has someone come 1x/wk to clean home. Pt endorses driving to pick up meals and go to grocery.  Patient currently functioning at min - mod A hand held assistance for mobility within room. Mod A for toileting transfer and LB clothing management and hygiene. Pt is not on O2 at baseline and pt on RA during session but he drops to mid 80's needing 2L's O2 to be reapplied via [place]. RR increased to 40's with short distance mobility and cuing for pursed lip breathing throughout. Patient will benefit from acute OT to increase overall independence in the areas of ADLs, functional mobility, and safety awareness in order to safely discharge to next venue of care.  ?   ? ?Recommendations for follow up therapy are one component of a multi-disciplinary discharge  planning process, led by the attending physician.  Recommendations may be updated based on patient status, additional functional criteria and insurance authorization.  ? ?Follow Up Recommendations ? Skilled nursing-short term rehab (<3 hours/day)  ?  ?Assistance Recommended at Discharge Frequent or constant Supervision/Assistance  ?Patient can return home with the following A lot of help with walking and/or transfers;A lot of help with bathing/dressing/bathroom;Assistance with cooking/housework;Help with stairs or ramp for entrance;Assist for transportation;Direct supervision/assist for medications management;Direct supervision/assist for financial management ? ?  ?Functional Status Assessment ? Patient has had a recent decline in their functional status and demonstrates the ability to make significant improvements in function in a reasonable and predictable amount of time.  ?Equipment Recommendations ? Other (comment) (defer to next venue of care)  ?  ?   ?Precautions / Restrictions Precautions ?Precautions: Fall ?Precaution Comments: Seizure precautions ?Restrictions ?Weight Bearing Restrictions: No  ? ?  ? ?Mobility Bed Mobility ?  ?  ?  ?  ?  ?  ?  ?General bed mobility comments: Pt seated in recliner chair ?  ? ?Transfers ?Overall transfer level: Needs assistance ?Equipment used: 1 person hand held assist ?Transfers: Sit to/from Stand, Bed to chair/wheelchair/BSC ?Sit to Stand: Min assist, Mod assist ?  ?  ?Step pivot transfers: Mod assist ?  ?  ?General transfer comment: mod HHA for functional mobility ?  ? ?  ?Balance Overall balance assessment: Needs assistance ?Sitting-balance support: No upper extremity supported, Feet supported ?Sitting balance-Leahy Scale: Good ?Sitting balance - Comments: steady sitting reaching within support ?  ?Standing balance support:  Single extremity supported ?Standing balance-Leahy Scale: Poor ?Standing balance comment: min - mod A ?  ?  ?  ?  ?  ?  ?  ?  ?  ?  ?  ?   ? ?ADL  either performed or assessed with clinical judgement  ? ?ADL Overall ADL's : Needs assistance/impaired ?  ?  ?Grooming: Wash/dry hands;Wash/dry face;Standing;Minimal assistance ?  ?  ?  ?  ?  ?  ?  ?  ?  ?Toilet Transfer: Moderate assistance;Ambulation ?Toilet Transfer Details (indicate cue type and reason): HHA ?Toileting- Clothing Manipulation and Hygiene: Moderate assistance;Sit to/from stand ?  ?  ?  ?Functional mobility during ADLs: Moderate assistance ?   ? ? ? ?Vision Patient Visual Report: No change from baseline ?   ?   ?   ?   ? ?Pertinent Vitals/Pain Pain Assessment ?Pain Assessment: No/denies pain  ? ? ? ?Hand Dominance Right ?  ?Extremity/Trunk Assessment Upper Extremity Assessment ?Upper Extremity Assessment: Generalized weakness;Overall [facility] for tasks assessed ?  ?Lower Extremity Assessment ?Lower Extremity Assessment: Defer to PT evaluation ?RLE Deficits / Details: hip flexion 4+/5; knee flexion/extension 4+/5; DF 4+/5; mild decreased coordination heel to shin; intact light touch, tone, and proprioception ?LLE Deficits / Details: hip flexion 4+/5; knee flexion/extension 4+/5; DF 4+/5; intact light touch, tone, and proprioception ?  ?Cervical / Trunk Assessment ?Cervical / Trunk Assessment: Normal ?  ?Communication Communication ?Communication: HOH ?  ?Cognition Arousal/Alertness: Awake/alert ?Behavior During Therapy: [facility] for tasks assessed/performed ?Overall Cognitive Status: No family/caregiver present to determine baseline cognitive functioning ?  ?  ?  ?  ?  ?  ?  ?  ?  ?  ?  ?  ?  ?  ?  ?  ?General Comments: Pt was oriented to self and location but needing orientation to situation and time. ?  ?  ?General Comments  skin abrasion noted L knee ? ?  ?   ?   ? ? ?Home Living Family/patient expects to be discharged to:: Private residence ?Living Arrangements: Alone ?Available Help at Discharge: Family;Available PRN/intermittently ?Type of Home: House ?Home Access: Stairs to enter ?Entrance  Stairs-Number of Steps: 4 (from front of home) ?Entrance Stairs-Rails: Right ?Home Layout: Two level;Able to live on main level with bedroom/bathroom ?  ?  ?Bathroom Shower/Tub: Walk-in shower ?  ?Bathroom Toilet: Standard ?  ?  ?Home Equipment: Kasandra Knudsen - single point;Shower seat ?  ?  ?  ? ?  ?Prior Functioning/Environment Prior Level of Function : Independent/Modified Independent ?  ?  ?  ?  ?  ?  ?Mobility Comments: Pt typically does not use any AD within home but will use SPC if needed; uses SPC when goes out into community.  (+) driving. ?ADLs Comments: Pt reports performing all self care tasks at mod I level and has someone come 1x/wk to clean home. He reports he does grocery store shopping or picks up food. ?  ? ?  ?  ?OT Problem List: Decreased strength;Decreased activity tolerance;Impaired balance (sitting and/or standing);Decreased safety awareness;Decreased knowledge of use of DME or AE;Decreased knowledge of precautions;Cardiopulmonary status limiting activity ?  ?   ?OT Treatment/Interventions: Self-care/ADL training;Therapeutic exercise;Therapeutic activities;Energy conservation;DME and/or AE instruction;Manual therapy;Balance training;Patient/family education  ?  ?OT Goals(Current goals can be found in the care plan section) Acute Rehab OT Goals ?Patient Stated Goal: to go home ?OT Goal Formulation: With patient ?Time For Goal Achievement: 12/12/21 ?Potential to Achieve Goals: Fair ?ADL Goals ?Pt Will  Perform Grooming: with supervision;standing ?Pt Will Perform Lower Body Dressing: with supervision;sit to/from stand ?Pt Will Transfer to Toilet: with supervision;ambulating ?Pt Will Perform Toileting - Clothing Manipulation and hygiene: with supervision;sit to/from stand  ?OT Frequency: Min 2X/week ?  ? ?   ?AM-PAC OT "6 Clicks" Daily Activity     ?Outcome Measure Help from another person eating meals?: None ?Help from another person taking care of personal grooming?: A Little ?Help from another person  toileting, which includes using toliet, bedpan, or urinal?: A Lot ?Help from another person bathing (including washing, rinsing, drying)?: A Lot ?Help from another person to put on and taking off regular upper body clothing?:

## 2021-11-28 NOTE — Evaluation (Signed)
Physical Therapy Evaluation ?Patient Details ?Name: Keith Levy ?MRN: [id number] ?DOB: 01/13/1941 ?Today's Date: 11/28/2021 ? ?History of Present Illness ? Pt is a 81 y.o. male presenting to hospital 3/15.  Per MD note "According to the patient and daughter over the past 5 days the patient has been experiencing multiple falls which is atypical for the patient.  He was seen by his doctor and ordered an outpatient head CT that was performed today showing acute or subacute infarct in the patient was sent immediately to the emergency department.".  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  Pt admitted with R MCA stroke, frequent falls, and acute COPD exacerbation.  PMH includes s/p 9/722 R femoral endarterectomy, R iliac thrombectomy, insertion of SFA and popliteal stents, and insertion of iliac stent B; bipolar disorder, HLD, PAD, LE angio, stents B LE's, a-fib, and COPD.  ?Clinical Impression ? Prior to hospital admission, pt was modified independent with functional mobility (typically does not use any AD within home but will use cane if needed; uses cane when in community); lives alone on main level of home with 4 STE R railing.  Currently pt is min assist semi-supine to sitting edge of bed; min assist with transfers using RW; and min assist to ambulate 40 feet with RW use.  Pt's HR in 60's bpm at rest but increased up to 115 bpm with ambulation (HR returned to 60's bpm within a minute of sitting rest)--nurse notified.  Pt would benefit from skilled PT to address noted impairments and functional limitations (see below for any additional details).  Upon hospital discharge, pt would benefit from SNF.   ?   ? ?Recommendations for follow up therapy are one component of a multi-disciplinary discharge planning process, led by the attending physician.  Recommendations may be updated based on patient status, additional functional criteria and  insurance authorization. ? ?Follow Up Recommendations Skilled nursing-short term rehab (<3 hours/day) ? ?  ?Assistance Recommended at Discharge Frequent or constant Supervision/Assistance  ?Patient can return home with the following ? A little help with walking and/or transfers;A little help with bathing/dressing/bathroom;Assistance with cooking/housework;Assist for transportation;Help with stairs or ramp for entrance ? ?  ?Equipment Recommendations Rolling walker (2 wheels);BSC/3in1  ?Recommendations for Other Services ? OT consult  ?  ?Functional Status Assessment Patient has had a recent decline in their functional status and demonstrates the ability to make significant improvements in function in a reasonable and predictable amount of time.  ? ?  ?Precautions / Restrictions Precautions ?Precautions: Fall ?Precaution Comments: Seizure precautions ?Restrictions ?Weight Bearing Restrictions: No  ? ?  ? ?Mobility ? Bed Mobility ?Overal bed mobility: Needs Assistance ?Bed Mobility: Supine to Sit ?  ?  ?Supine to sit: Min assist, HOB elevated ?  ?  ?General bed mobility comments: assist for trunk; use of bed rails ?  ? ?Transfers ?Overall transfer level: Needs assistance ?Equipment used: Rolling walker (2 wheels) ?Transfers: Sit to/from Stand, Bed to chair/wheelchair/BSC ?Sit to Stand: Min assist ?  ?Step pivot transfers: Min assist (stand step turn bed to recliner with RW use) ?  ?  ?  ?General transfer comment: vc's for UE placement; assist to come to standing ?  ? ?Ambulation/Gait ?Ambulation/Gait assistance: Min assist ?Gait Distance (Feet): 40 Feet ?Assistive device: Rolling walker (2 wheels) ?  ?Gait velocity: decreased ?  ?  ?General Gait Details: short steps with decreased B LE foot clearance ? ?Stairs ?  ?  ?  ?  ?  ? ?  Wheelchair Mobility ?  ? ?Modified Rankin (Stroke Patients Only) ?  ? ?  ? ?Balance Overall balance assessment: Needs assistance ?Sitting-balance support: No upper extremity supported, Feet  supported ?Sitting balance-Leahy Scale: Good ?Sitting balance - Comments: steady sitting reaching within support ?  ?Standing balance support: Single extremity supported ?Standing balance-Leahy Scale: Fair ?Standing balance comment: steady static standing with at least single UE support ?  ?  ?  ?  ?  ?  ?  ?  ?  ?  ?  ?   ? ? ? ?Pertinent Vitals/Pain Pain Assessment ?Pain Assessment: No/denies pain ?HR and O2 stable and WFL throughout treatment session.  ? ? ?Home Living Family/patient expects to be discharged to:: Private residence ?Living Arrangements: Alone ?Available Help at Discharge: Family;Available PRN/intermittently (pt's daughter) ?Type of Home: House ?Home Access: Stairs to enter ?Entrance Stairs-Rails: Right ?Entrance Stairs-Number of Steps: 4 (from front of home) ?  ?Home Layout: Two level;Able to live on main level with bedroom/bathroom ?Home Equipment: Kasandra Knudsen - single point;Shower seat ?   ?  ?Prior Function Prior Level of Function : Independent/Modified Independent ?  ?  ?  ?  ?  ?  ?Mobility Comments: Pt typically does not use any AD within home but will use SPC if needed; uses SPC when goes out into community.  (+) driving. ?  ?  ? ? ?Hand Dominance  ?   ? ?  ?Extremity/Trunk Assessment  ? Upper Extremity Assessment ?Upper Extremity Assessment: Defer to OT evaluation ?  ? ?Lower Extremity Assessment ?Lower Extremity Assessment: RLE deficits/detail;LLE deficits/detail ?RLE Deficits / Details: hip flexion 4+/5; knee flexion/extension 4+/5; DF 4+/5; mild decreased coordination heel to shin; intact light touch, tone, and proprioception ?LLE Deficits / Details: hip flexion 4+/5; knee flexion/extension 4+/5; DF 4+/5; intact light touch, tone, and proprioception ?  ? ?Cervical / Trunk Assessment ?Cervical / Trunk Assessment: Normal  ?Communication  ? Communication: HOH  ?Cognition Arousal/Alertness: Awake/alert ?Behavior During Therapy: [facility] for tasks assessed/performed ?Overall Cognitive Status: No  family/caregiver present to determine baseline cognitive functioning ?  ?  ?  ?  ?  ?  ?  ?  ?  ?  ?  ?  ?  ?  ?  ?  ?General Comments: Oriented to person, place with a little extra time (pt reported he was in hospital; initially stated he was in [facility] but then said he was in [place]); pt stated he did not know the date but guessed it was 2022-2023; and pt reported he was here d/t falls. ?  ?  ? ?  ?General Comments General comments (skin integrity, edema, etc.): skin abrasion noted L knee.  Nursing cleared pt for participation in physical therapy.  Pt agreeable to PT session. ? ?  ?Exercises  Transfers and ambulation using RW  ? ?Assessment/Plan  ?  ?PT Assessment Patient needs continued PT services  ?PT Problem List Decreased strength;Decreased activity tolerance;Decreased balance;Decreased mobility;Decreased knowledge of use of DME;Decreased knowledge of precautions;Cardiopulmonary status limiting activity ? ?   ?  ?PT Treatment Interventions DME instruction;Gait training;Stair training;Functional mobility training;Therapeutic activities;Therapeutic exercise;Balance training;Patient/family education   ? ?PT Goals (Current goals can be found in the Care Plan section)  ?Acute Rehab PT Goals ?Patient Stated Goal: to improve strength and balance ?PT Goal Formulation: With patient ?Time For Goal Achievement: 12/12/21 ?Potential to Achieve Goals: Good ? ?  ?Frequency 7X/week ?  ? ? ?Co-evaluation   ?  ?  ?  ?  ? ? ?  ?  AM-PAC PT "6 Clicks" Mobility  ?Outcome Measure Help needed turning from your back to your side while in a flat bed without using bedrails?: None ?Help needed moving from lying on your back to sitting on the side of a flat bed without using bedrails?: A Little ?Help needed moving to and from a bed to a chair (including a wheelchair)?: A Little ?Help needed standing up from a chair using your arms (e.g., wheelchair or bedside chair)?: A Little ?Help needed to walk in hospital room?: A  Little ?Help needed climbing 3-5 steps with a railing? : A Lot ?6 Click Score: 18 ? ?  ?End of Session Equipment Utilized During Treatment: Gait belt;Oxygen (2 L via nasal cannula) ?Activity Tolerance: Other (comment) (Limit

## 2021-11-28 NOTE — Evaluation (Signed)
Speech Language Pathology Evaluation ?Patient Details ?Name: Keith Levy ?MRN: [id number] ?DOB: 1941-08-15 ?Today's Date: 11/28/2021 ?Time: 2951-8841 ?SLP Time Calculation (min) (ACUTE ONLY): 20 min ? ?Problem List:  ?Patient Active Problem List  ? Diagnosis Date Noted  ? Stroke (cerebrum) ([place]) 11/27/2021  ? COPD with acute exacerbation ([place]) 11/27/2021  ? Malnutrition of moderate degree 05/24/2021  ? Atherosclerosis of artery of extremity with rest pain ([place]) 05/22/2021  ? Tobacco use disorder 05/10/2021  ? Chronic alcohol abuse 11/13/2020  ? Atherosclerosis of native arteries of extremity with intermittent claudication ([place]) 10/16/2020  ? CKD (chronic kidney disease) stage 3, GFR 30-59 ml/min ([facility]) 04/27/2019  ? 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 Centrilobular emphysema ([place]) 04/14/2018  ? Medicare annual wellness visit, initial 04/07/2017  ? Mild major depression ([place]) 03/13/2017  ? Bipolar 1 disorder ([place]) 10/06/2016  ? Tubular adenoma 10/06/2016  ? Combined hyperlipidemia 06/26/2014  ? Atherosclerosis of native arteries of extremity with rest pain ([place]) 06/26/2014  ? ?Past Medical History:  ?Past Medical History:  ?Diagnosis Date  ? Bipolar disorder ([place])   ? CKD (chronic kidney disease), stage III ([place])   ? Hyperlipidemia   ? PAD (peripheral artery disease) ([place])   ? Pre-diabetes   ? ?Past Surgical History:  ?Past Surgical History:  ?Procedure Laterality Date  ? ENDARTERECTOMY FEMORAL Right 05/22/2021  ? Procedure: RIGHT ENDARTERECTOMY FEMORAL; INSERTION OF SFA AND POPLITEAL STENTS, THROMBECTOMY ILIAC;  Surgeon: Algernon Huxley, MD;  Location: [facility] ORS;  Service: Vascular;  Laterality: Right;  ? INSERTION OF ILIAC STENT Bilateral 05/22/2021  ? Procedure: INSERTION OF ILIAC STENT, BILATERAL;  Surgeon: Algernon Huxley, MD;  Location: [facility] ORS;  Service: Vascular;  Laterality: Bilateral;  ? LOWER EXTREMITY ANGIOGRAPHY Right 10/22/2020  ? Procedure: LOWER EXTREMITY ANGIOGRAPHY;   Surgeon: Algernon Huxley, MD;  Location: [place] [facility];  Service: Cardiovascular;  Laterality: Right;  ? LOWER EXTREMITY ANGIOGRAPHY Left 11/01/2020  ? Procedure: LOWER EXTREMITY ANGIOGRAPHY;  Surgeon: Algernon Huxley, MD;  Location: [place] [facility];  Service: Cardiovascular;  Laterality: Left;  ? LOWER EXTREMITY ANGIOGRAPHY Right 12/06/2020  ? Procedure: LOWER EXTREMITY ANGIOGRAPHY;  Surgeon: Algernon Huxley, MD;  Location: [place] [facility];  Service: Cardiovascular;  Laterality: Right;  ? LOWER EXTREMITY ANGIOGRAPHY Right 05/16/2021  ? Procedure: LOWER EXTREMITY ANGIOGRAPHY;  Surgeon: Algernon Huxley, MD;  Location: [place] [facility];  Service: Cardiovascular;  Laterality: Right;  ? VASCULAR SURGERY    ? stents on LLE and RLE  ? ?HPI:  ?Per H&P 11/27/21 "Keith Levy is a 81 y.o. male with medical history significant of PAD, chronic A-fib on Eliquis, CKD stage IIIa, HLD, question of COPD, bipolar disorder, cigar smoker, alcohol abuse, came with frequent falls and loss of balance.     Symptoms started 3 days ago, patient suddenly developed lightheadedness, denies any spinning sensation, denies any weakness numbness of any of the limbs.  He sustained total of 4 falls in 3 days.  1 time he hit his forehead, the rest of the case he was able to grab something and did not hit hard.  No loss of consciousness.  He said the lightheadedness has been persistent, no correlation with head or body movement, no hearing changes.  He is also reported that recently he started to have wheezing and productive cough of clear phlegm, he denied any history of COPD but daughter at bedside reported patient does have COPD.  He admitted he smokes  every day and drinks 3-4 beers a day.  Today, patient went to see PCP for his dizziness symptoms, outpatient CT scan showed patient has a acute stroke of the right MCA distribution as well as a 1.7 cm likely meningioma of the left cerebral pontine angle"  ? ?Assessment / Plan /  Recommendation ?Clinical Impression ? Pt seen for cognitive-linguistic evaluation. Pt alert. Flat affect. On 2L/min O2 via [place]. Requires cues for proper placement of [place]. Consuming breakfast upon SLP entrance to room. Seated EOB. No observed difficulty swallowing observed. Cleared with RN. RN present for portions of evaluation and for education.  ?  ?Assessment completed via informal means and portions of Cognistat. Pt alert and oriented to self, loosely to place ("hospital" in "[place]"), to month and year (not date/DOW). Pt not oriented to situation. Pt with impaired pragmatic language including notably flat affect, reduced eye contact, reduced initiation, and reduced topic maintenance. No s/sx anomia or dysarthria noted; however, pt with tendency to communicated via single words and shorter phrases/sentences. Pt with slow responses at times. Pt benefited from extra time to respond as well as repetition of stimuli.  Pt presents with cognitive-linguistic deficits affecting attention (focused, sustained), memory (immediate/short term/functional), problem solving (verbal/functional), abstract reasoning, and insight. ? ?Anticipate need for continued SLP services post-acute to address cognitive-linguistic deficits. Recommend frequent/constant supervision given cognitive-linguistic deficits.  ? ?SLP to f/u per POC for cognitive-linguistic tx while pt in house.  ? ?Pt and RN made aware of results, recommendations, and SLP POC. Suspect need for reinforcement of content.  ?  ?SLP Assessment ? SLP Recommendation/Assessment: Patient needs continued [place] [facility] Services ?SLP Visit Diagnosis: Attention and concentration deficit;Frontal lobe and executive function deficit ?Attention and concentration deficit following: Cerebral infarction  ?  ?Recommendations for follow up therapy are one component of a multi-disciplinary discharge planning process, led by the attending physician.  Recommendations may be updated  based on patient status, additional functional criteria and insurance authorization. ?   ?Follow Up Recommendations ? Skilled nursing-short term rehab (<3 hours/day)  ?  ?Assistance Recommended at Discharge ? Frequent or constant Supervision/Assistance  ?Functional Status Assessment Patient has had a recent decline in their functional status and demonstrates the ability to make significant improvements in function in a reasonable and predictable amount of time.  ?Frequency and Duration min 2x/week  ?2 weeks ?  ?   ?SLP Evaluation ?Cognition ? Overall Cognitive Status: No family/caregiver present to determine baseline cognitive functioning ?Arousal/Alertness: Awake/alert ?Orientation Level: Oriented to person;Disoriented to place;Disoriented to situation;Disoriented to [facility] [place]; not oriented to [place]) ?Attention: Focused;Sustained ?Focused Attention: Impaired ?Focused Attention Impairment: Verbal basic;Functional basic ?Sustained Attention: Impaired ?Sustained Attention Impairment: Verbal basic;Functional basic ?Memory: Impaired ?Memory Impairment: Storage deficit;Retrieval deficit;Decreased recall of new information (x6 trials to code x4 words; 4/12 on Memory subtest of Cognistat) ?Awareness: Impaired ?Problem Solving: Impaired ?Problem Solving Impairment: Verbal basic (5/8 verbal problem solving questions) ?Executive Function: Reasoning (3/8 Reasoning subtest of Cognistat; 4/4 basic mental calculations with extra time/repetition) ?Reasoning: Impaired ?Reasoning Impairment: Verbal basic ?Safety/Judgment: Impaired (limited insight into CLOF) ?  ?   ?Comprehension ? Auditory Comprehension ?Yes/No Questions: Within Functional Limits (basic) ?Commands: Within Functional Limits (basic) ?Conversation:  (difficulty following conversation noted; required frequent repetition) ?Interfering Components: Attention ?EffectiveTechniques: Extra processing time;Repetition ?Visual  Recognition/Discrimination ?Discrimination: Not tested ?Reading Comprehension ?Reading Status: Not tested  ?  ?Expression Expression ?Primary Mode of Expression: Verbal ?Verbal Expression ?Overall Verbal Expression: Impaired ?Initiation: Impaired ?Autom

## 2021-11-28 NOTE — Assessment & Plan Note (Signed)
Requiring 2 L oxygen via nasal cannula.  This is likely due to mild COPD exacerbation.  Continue COPD regimen as ordered.  Wean oxygen as able ?

## 2021-11-29 DIAGNOSIS — I70229 Atherosclerosis of native arteries of extremities with rest pain, unspecified extremity: Secondary | ICD-10-CM | POA: Diagnosis not present

## 2021-11-29 DIAGNOSIS — J9601 Acute respiratory failure with hypoxia: Secondary | ICD-10-CM | POA: Diagnosis not present

## 2021-11-29 DIAGNOSIS — F319 Bipolar disorder, unspecified: Secondary | ICD-10-CM | POA: Diagnosis not present

## 2021-11-29 DIAGNOSIS — I639 Cerebral infarction, unspecified: Secondary | ICD-10-CM | POA: Diagnosis not present

## 2021-11-29 DIAGNOSIS — Z7189 Other specified counseling: Secondary | ICD-10-CM | POA: Diagnosis not present

## 2021-11-29 LAB — ECHOCARDIOGRAM COMPLETE
Height: 72 in
MV M vel: 6.03 m/s
MV Peak grad: 145.2 mmHg
P 1/2 time: 829 msec
S' Lateral: 3.95 cm
Weight: 2944 oz

## 2021-11-29 LAB — HEMOGLOBIN A1C
Hgb A1c MFr Bld: 5.4 % (ref 4.8–5.6)
Mean Plasma Glucose: 108 mg/dL

## 2021-11-29 NOTE — TOC Progression Note (Signed)
Transition of Care (TOC) - Progression Note  ? ? ?Patient Details  ?Name: Neel Buffone ?MRN: [id number] ?Date of Birth: 18-Jun-1941 ? ?Transition of Care (TOC) CM/SW Contact  ?Pete Pelt, RN ?Phone Number: ?11/29/2021, 11:26 AM ? ?Clinical Narrative:   Palliative and MD spoke to patient.  SNF is recommended for patient .  At this time, patient is refusing SNF and would like to speak with daughter.  Daughter will be in after 12 noon to discuss with RNCM. ? ? ? ?Expected Discharge Plan: [place] ?Barriers to Discharge: Continued Medical Work up ? ?Expected Discharge Plan and Services ?Expected Discharge Plan: [place] ?  ?Discharge Planning Services: CM Consult ?  ?Living arrangements for the past 2 months: [place] ?                ?  ?  ?  ?  ?  ?  ?  ?  ?  ?  ? ? ?Social Determinants of Health (SDOH) Interventions ?  ? ?Readmission Risk Interventions ?Readmission Risk Prevention Plan 11/28/2021  ?Transportation Screening Complete  ?PCP or Specialist Appt within 5-7 Days Complete  ?Home Care Screening Complete  ?Medication Review (RN CM) Complete  ?Some recent data might be hidden  ? ? ?

## 2021-11-29 NOTE — Assessment & Plan Note (Signed)
Overall poor prognosis. outpt Palliative care at SNF ?

## 2021-11-29 NOTE — Progress Notes (Signed)
Physical Therapy Treatment ?Patient Details ?Name: Keith Levy ?MRN: [id number] ?DOB: November 23, 1940 ?Today's Date: 11/29/2021 ? ? ?History of Present Illness Pt is a 81 y.o. male presenting to hospital 3/15.  Per MD note "According to the patient and daughter over the past 5 days the patient has been experiencing multiple falls which is atypical for the patient.  He was seen by his doctor and ordered an outpatient head CT that was performed today showing acute or subacute infarct in the patient was sent immediately to the emergency department.".  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  Pt admitted with R MCA stroke, frequent falls, and acute COPD exacerbation.  PMH includes s/p 9/722 R femoral endarterectomy, R iliac thrombectomy, insertion of SFA and popliteal stents, and insertion of iliac stent B; bipolar disorder, HLD, PAD, LE angio, stents B LE's, a-fib, and COPD. ? ?  ?PT Comments  ? ? Physical Therapy session completed this date. Patient tolerated session well and was agreeable to treatment. Upon entry into room patient was in L sidelying eating lunch. No pain reported throughout session. Patient was able to complete supine<>sitting bed mobility at supervision, with cueing on hand placement. No physical assistance required. Sit to stands were also completed at supervision with and without a RW. 2X10 additional rep of sit to stand transfer was completed for BLE strengthening. Seated recovery break was required between sets due to fatigue. Patient completed ~15 steps within the room at [facility] with no LOB noted. Patient was left in bed with family present and all needs met. Patient is progressing towards his goals and would continue to benefit from skilled physical therapy in order to optimize patient's return to PLOF. Continue to recommend STR upon discharge from acute hospitalization.    ?  ?Recommendations for follow up therapy are one  component of a multi-disciplinary discharge planning process, led by the attending physician.  Recommendations may be updated based on patient status, additional functional criteria and insurance authorization. ? ?Follow Up Recommendations ? Skilled nursing-short term rehab (<3 hours/day) ?  ?  ?Assistance Recommended at Discharge Frequent or constant Supervision/Assistance  ?Patient can return home with the following A little help with walking and/or transfers;A little help with bathing/dressing/bathroom;Assistance with cooking/housework;Assist for transportation;Help with stairs or ramp for entrance ?  ?Equipment Recommendations ? Rolling walker (2 wheels);BSC/3in1  ?  ?Recommendations for Other Services   ? ? ?  ?Precautions / Restrictions Precautions ?Precautions: Fall ?Precaution Comments: Seizure precautions ?Restrictions ?Weight Bearing Restrictions: No  ?  ? ?Mobility ? Bed Mobility ?Overal bed mobility: Needs Assistance ?Bed Mobility: Supine to Sit, Sit to Supine ?  ?  ?Supine to sit: Supervision ?Sit to supine: Supervision ?  ?General bed mobility comments: with cueing on hand placement ?Patient Response: Cooperative ? ?Transfers ?Overall transfer level: Needs assistance ?Equipment used: None, Rolling walker (2 wheels) (first attempt patient completed without RW (author told patient to sit at [facility] with feet firmly on floor and patient stood), second attempt was completed with RW) ?Transfers: Sit to/from Stand ?Sit to Stand: Supervision ?  ?  ?  ?  ?  ?  ?  ? ?Ambulation/Gait ?Ambulation/Gait assistance: Min guard ?Gait Distance (Feet): 15 Feet ?Assistive device: Rolling walker (2 wheels) ?  ?Gait velocity: decreased ?  ?  ?General Gait Details: short steps with decreased B LE foot clearance, completed on 3L O2 via [place] ? ? ?Stairs ?  ?  ?  ?  ?  ? ? ?  Wheelchair Mobility ?  ? ?Modified Rankin (Stroke Patients Only) ?  ? ? ?  ?Balance Overall balance assessment: Needs assistance ?Sitting-balance support: No  upper extremity supported, Feet supported ?Sitting balance-Leahy Scale: Good ?Sitting balance - Comments: steady sitting reaching within support ?  ?Standing balance support: Bilateral upper extremity supported, During functional activity, Reliant on assistive device for balance ?Standing balance-Leahy Scale: Fair ?  ?  ?  ?  ?  ?  ?  ?  ?  ?  ?  ?  ?  ? ?  ?Cognition Arousal/Alertness: Awake/alert ?Behavior During Therapy: [facility] for tasks assessed/performed ?Overall Cognitive Status: No family/caregiver present to determine baseline cognitive functioning ?  ?  ?  ?  ?  ?  ?  ?  ?  ?  ?  ?  ?  ?  ?  ?  ?General Comments: Pt was oriented to self and location but needing orientation to situation and time. ?  ?  ? ?  ?Exercises Other Exercises ?Other Exercises: 2x10 sit to stands from EOB, completed at supervision, patient utilized sink to pull up into standing (across from bed), seated rest break required between sets due to fatigue ? ?  ?General Comments General comments (skin integrity, edema, etc.): SpO2 remained >90% throughout session on 3L O2 via [place], HR ranged from 63bpm (at rest) to 89bpm following activity ?  ?  ? ?Pertinent Vitals/Pain Pain Assessment ?Pain Assessment: No/denies pain  ? ? ?Home Living   ?  ?  ?  ?  ?  ?  ?  ?  ?  ?   ?  ?Prior Function    ?  ?  ?   ? ?PT Goals (current goals can now be found in the care plan section) Acute Rehab PT Goals ?Patient Stated Goal: to improve strength and balance ?PT Goal Formulation: With patient ?Time For Goal Achievement: 12/12/21 ?Potential to Achieve Goals: Good ?Progress towards PT goals: Progressing toward goals ? ?  ?Frequency ? ? ? 7X/week ? ? ? ?  ?PT Plan Current plan remains appropriate  ? ? ?Co-evaluation   ?  ?  ?  ?  ? ?  ?AM-PAC PT "6 Clicks" Mobility   ?Outcome Measure ? Help needed turning from your back to your side while in a flat bed without using bedrails?: None ?Help needed moving from lying on your back to sitting on the side of a flat bed  without using bedrails?: A Little ?Help needed moving to and from a bed to a chair (including a wheelchair)?: A Little ?Help needed standing up from a chair using your arms (e.g., wheelchair or bedside chair)?: A Little ?Help needed to walk in hospital room?: A Little ?Help needed climbing 3-5 steps with a railing? : A Lot ?6 Click Score: 18 ? ?  ?End of Session Equipment Utilized During Treatment: Gait belt;Oxygen ?Activity Tolerance: Patient tolerated treatment well;Patient limited by fatigue ?Patient left: in bed;with call bell/phone within reach;with family/visitor present ?Nurse Communication: Mobility status ?PT Visit Diagnosis: Unsteadiness on feet (R26.81);Other abnormalities of gait and mobility (R26.89);Muscle weakness (generalized) (M62.81);History of falling (Z91.81) ?  ? ? ?Time: 8101-7510 ?PT Time Calculation (min) (ACUTE ONLY): 19 min ? ?Charges:  $Therapeutic Exercise: 8-22 mins          ?          ? ?Kerrigan Glendening, PT  ?11/29/21. 1:22 PM ? ? ?

## 2021-11-29 NOTE — Progress Notes (Addendum)
? ?                                                                                                                                                     ?                                                   ?Daily Progress Note  ? ?Patient Name: Keith Levy       Date: 11/29/2021 ?DOB: 23-Nov-1940  Age: 81 y.o. MRN#: [id number] ?Attending Physician: Max Sane, MD ?Primary Care Physician: Rusty Aus, MD ?Admit Date: 11/27/2021 ? ?Reason for Consultation/Follow-up: Establishing goals of care ? ?Subjective: ?Notes and labs reviewed. Patient is sitting in bed. No complaints. Talked to daughter as planned. Discussed the neurologic findings and his bradycardia to the 30's and returns to  50's. Discussed that he is reasonably oriented and functional. We discussed multiple scenarios. Daughter wants to try to increase HR and see how he does. She understands another stoke is very likely, but is unsure of the time line it may occur, and at this time, would like to pursue rehab with palliative to follow with transition to hospice when ready. Spoke with patient and he really wants to go home, but he is amenable to rehab.  ? ?Length of Stay: 2 ? ?Current Medications: ?Scheduled Meds:  ? aspirin  81 mg Oral Daily  ? azithromycin  500 mg Oral Daily  ? budesonide (PULMICORT) nebulizer solution  0.5 mg Nebulization Q12H  ? feeding supplement  237 mL Oral BID BM  ? folic acid  1 mg Oral Daily  ? ipratropium-albuterol  3 mL Nebulization TID  ? lithium carbonate  300 mg Oral BID  ? mometasone-formoterol  2 puff Inhalation BID  ? multivitamin with minerals  1 tablet Oral Daily  ? PARoxetine  20 mg Oral Daily  ? predniSONE  40 mg Oral Q breakfast  ? QUEtiapine  200 mg Oral QHS  ? simvastatin  20 mg Oral QHS  ? thiamine  100 mg Oral Daily  ? Or  ? thiamine  100 mg Intravenous Daily  ? ? ?Continuous Infusions: ? ? ?PRN Meds: ?acetaminophen **OR** acetaminophen (TYLENOL) oral liquid 160 mg/5 mL **OR** acetaminophen, albuterol,  hydrALAZINE, LORazepam **OR** LORazepam, meclizine, oxyCODONE-acetaminophen, senna-docusate ? ?Physical Exam ?Pulmonary:  ?   Effort: Pulmonary effort is normal.  ?Neurological:  ?   Mental Status: He is alert.  ?         ? ?Vital Signs: BP (!) 154/89 (BP Location: Right Arm)   Pulse (!) 50   Temp 98 ?F (36.7 ?C)   Resp 20   Ht 6' (1.829 m)   [place]  83.5 kg   SpO2 99%   BMI 24.95 kg/m?  ?SpO2: SpO2: 99 % ?O2 Device: O2 Device: Room Air ?O2 Flow Rate: O2 Flow Rate (L/min): 2 L/min ? ?Intake/output summary:  ?Intake/Output Summary (Last 24 hours) at 11/29/2021 1056 ?Last data filed at 11/29/2021 1055 ?Gross per 24 hour  ?Intake 480 ml  ?Output 500 ml  ?Net -20 ml  ? ?LBM: Last BM Date :  (prior to admission) ?Baseline Weight: Weight: 83.5 kg ?Most recent weight: Weight: 83.5 kg ? ? ?Patient Active Problem List  ? Diagnosis Date Noted  ? Acute respiratory failure with hypoxia ([place]) 11/28/2021  ? Stroke (cerebrum) ([place]) 11/27/2021  ? COPD with acute exacerbation ([place]) 11/27/2021  ? Malnutrition of moderate degree 05/24/2021  ? Atherosclerosis of artery of extremity with rest pain ([place]) 05/22/2021  ? Tobacco use disorder 05/10/2021  ? Chronic alcohol abuse 11/13/2020  ? Atherosclerosis of native arteries of extremity with intermittent claudication ([place]) 10/16/2020  ? CKD (chronic kidney disease) stage 3, GFR 30-59 ml/min (HCC) 04/27/2019  ? 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 ([place]) 04/27/2019  ? Centrilobular emphysema ([place]) 04/14/2018  ? Medicare annual wellness visit, initial 04/07/2017  ? Mild major depression ([place]) 03/13/2017  ? Bipolar 1 disorder ([place]) 10/06/2016  ? Tubular adenoma 10/06/2016  ? Combined hyperlipidemia 06/26/2014  ? ? ?Palliative Care Assessment & Plan  ? ?Recommendations/Plan: ? ?Plan for SNF with PMT to follow outpatient. Transition to hospice when ready.  ? ?Code Status: ? ?  ?Code Status Orders  ?(From admission, onward)  ?  ? ? ?  ? ?  Start     Ordered  ? 11/28/21 1447  Do not  attempt resuscitation (DNR)  Continuous       ?Question Answer Comment  ?In the event of cardiac or respiratory ARREST Do not call a ?code blue?   ?In the event of cardiac or respiratory ARREST Do not perform Intubation, CPR, defibrillation or ACLS   ?In the event of cardiac or respiratory ARREST Use medication by any route, position, wound care, and other measures to relive pain and suffering. May use oxygen, suction and manual treatment of airway obstruction as needed for comfort.   ?Comments MOST form in [place]   ?  ? 11/28/21 1446  ? ?  ?  ? ?  ? ?Code Status History   ? ? Date Active Date Inactive Code Status Order ID Comments User Context  ? 11/27/2021 1825 11/28/2021 1446 Full Code [id number]  Lequita Halt, MD ED  ? 05/22/2021 1819 05/29/2021 2220 DNR [id number]  Algernon Huxley, MD Inpatient  ? 05/22/2021 1344 05/22/2021 1819 Full Code [id number]  Algernon Huxley, MD Inpatient  ? 12/06/2020 1225 12/06/2020 1835 Full Code [id number]  Algernon Huxley, MD Inpatient  ? 11/01/2020 0950 11/01/2020 1621 Full Code [id number]  Algernon Huxley, MD Inpatient  ? 10/22/2020 1411 10/22/2020 2120 Full Code [id number]  Algernon Huxley, MD Inpatient  ? ?  ? ? ?Prognosis: ? Unable to determine ? ? ? ?Care plan was discussed with RN and attending.  ? ?Thank you for allowing the Palliative Medicine Team to assist in the care of this patient. ? ? ? ? ?Asencion Gowda, NP ? ?Please contact Palliative Medicine Team phone at [phone] for questions and concerns.  ? ? ? ? ? ?

## 2021-11-29 NOTE — Assessment & Plan Note (Signed)
Continue albuterol nebs as needed.  Budesonide, Dulera, prednisone and Zithromax.  Wean oxygen as able ?

## 2021-11-29 NOTE — Assessment & Plan Note (Signed)
Continue Seroquel. 

## 2021-11-29 NOTE — Assessment & Plan Note (Signed)
Continue aspirin and statin. 

## 2021-11-29 NOTE — Clinical Social Work Note (Signed)
RE:  Keith Levy   ?Date of Birth:   02/22/1942______  ?Date: 11/29/21     ? ? ?To Whom It May Concern: ? ?Please be advised that the above-named patient will require a short-term nursing home stay - anticipated 30 days or less for rehabilitation and strengthening.  The plan is for return home. ? ? ?MD electronic signature noted below ?

## 2021-11-29 NOTE — Care Management Important Message (Signed)
Important Message ? ?Patient Details  ?Name: Keith Levy ?MRN: [id number] ?Date of Birth: [birth year] ? ? ?Medicare Important Message Given:  N/A - LOS <3 / Initial given by admissions ? ? ? ? ?[name] ?11/29/2021, 8:44 AM ?

## 2021-11-29 NOTE — TOC Progression Note (Addendum)
Transition of Care (TOC) - Progression Note  ? ? ?Patient Details  ?Name: Keith Levy ?MRN: [id number] ?Date of Birth: October 19, 1940 ? ?Transition of Care (TOC) CM/SW Contact  ?Malania Gawthrop A Clem Wisenbaker, LCSW ?Phone Number: ?11/29/2021, 3:14 PM ? ?Clinical Narrative:  documents uploaded for [place]  ? ?PASRR obtained ? ? ? ?Expected Discharge Plan: [place] ?Barriers to Discharge: Continued Medical Work up ? ?Expected Discharge Plan and Services ?Expected Discharge Plan: [place] ?  ?Discharge Planning Services: CM Consult ?  ?Living arrangements for the past 2 months: [place] ?                ?  ?  ?  ?  ?  ?  ?  ?  ?  ?  ? ? ?Social Determinants of Health (SDOH) Interventions ?  ? ?Readmission Risk Interventions ?Readmission Risk Prevention Plan 11/28/2021  ?Transportation Screening Complete  ?PCP or Specialist Appt within 5-7 Days Complete  ?Home Care Screening Complete  ?Medication Review (RN CM) Complete  ?Some recent data might be hidden  ? ? ?

## 2021-11-29 NOTE — [place] FL2 (Addendum)
?[place] MEDICAID FL2 LEVEL OF CARE SCREENING TOOL  ?  ? ?IDENTIFICATION  ?Patient Name: ?Keith Levy Birthdate: 1940-11-14 Sex: male Admission Date (Current Location): ?11/27/2021  ?[place] and [place] Number: ? [place] ?  Facility and Address:  ?[facility], [address] [place], [place] [address] ?     Provider Number: ?[id number]  ?Attending Physician Name and Address:  ?Max Sane, MD ? Relative Name and Phone Number:  ?Erling Conte (Daughter)   [phone] [facility]) ?   ?Current Level of Care: ?Hospital Recommended Level of Care: ?[place] Prior Approval Number: ?  ? ?Date Approved/Denied: ?  PASRR Number: ?[id number] E ?Expires 12/29/21 ? ?Discharge Plan: ?SNF ?  ? ?Current Diagnoses: ?Patient Active Problem List  ? Diagnosis Date Noted  ? Acute respiratory failure with hypoxia ([place]) 11/28/2021  ? Stroke (cerebrum) ([place]) 11/27/2021  ? COPD with acute exacerbation ([place]) 11/27/2021  ? Malnutrition of moderate degree 05/24/2021  ? Atherosclerosis of artery of extremity with rest pain ([place]) 05/22/2021  ? Tobacco use disorder 05/10/2021  ? Chronic alcohol abuse 11/13/2020  ? Atherosclerosis of native arteries of extremity with intermittent claudication ([place]) 10/16/2020  ? CKD (chronic kidney disease) stage 3, GFR 30-59 ml/min (HCC) 04/27/2019  ? 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 Centrilobular emphysema ([place]) 04/14/2018  ? Medicare annual wellness visit, initial 04/07/2017  ? Mild major depression ([place]) 03/13/2017  ? Bipolar 1 disorder ([place]) 10/06/2016  ? Tubular adenoma 10/06/2016  ? Combined hyperlipidemia 06/26/2014  ? ? ?Orientation RESPIRATION BLADDER Height & Weight   ?  ?Self ? O2 Incontinent, External catheter Weight: 83.5 kg ?Height:  6' (182.9 cm)  ?BEHAVIORAL SYMPTOMS/MOOD NEUROLOGICAL BOWEL NUTRITION STATUS  ?    Incontinent Diet (regular)  ?AMBULATORY STATUS COMMUNICATION OF NEEDS Skin   ?Limited Assist (ambulated  15 feet) Verbally Normal ?  ?  ?  ?    ?     ?     ? ? ?Personal Care Assistance Level of Assistance  ?Bathing, Feeding, Dressing Bathing Assistance: Limited assistance ?Feeding assistance: Limited assistance ?Dressing Assistance: Limited assistance ?   ? ?Functional Limitations Info  ?Sight, Hearing, Speech Sight Info: Impaired ?Hearing Info: Impaired ?Speech Info: Adequate  ? ? ?SPECIAL CARE FACTORS FREQUENCY  ?PT (By licensed PT), OT (By licensed OT)   ?  ?PT Frequency: MIn 5x weekly ?OT Frequency: Min 5x weekly ?  ?  ?  ?   ? ? ?Contractures Contractures Info: Present  ? ? ?Additional Factors Info  ?Code Status, Allergies Code Status Info: DNR ?Allergies Info: No known allergies ?  ?  ?  ?   ? ?Current Medications (11/29/2021):  This is the current hospital active medication list ?Current Facility-Administered Medications  ?Medication Dose Route Frequency Provider Last Rate Last Admin  ? acetaminophen (TYLENOL) tablet 650 mg  650 mg Oral Q4H PRN Lequita Halt, MD      ? Or  ? acetaminophen (TYLENOL) 160 MG/5ML solution 650 mg  650 mg Per Tube Q4H PRN Wynetta Fines T, MD      ? Or  ? acetaminophen (TYLENOL) suppository 650 mg  650 mg Rectal Q4H PRN Wynetta Fines T, MD      ? albuterol (PROVENTIL) (2.5 MG/3ML) 0.083% nebulizer solution 2.5 mg  2.5 mg Nebulization Q2H PRN Wynetta Fines T, MD   2.5 mg at 11/29/21 1341  ? aspirin chewable tablet 81 mg  81 mg Oral Daily Derek Jack, MD  81 mg at 11/29/21 0807  ? azithromycin (ZITHROMAX) tablet 500 mg  500 mg Oral Daily Wynetta Fines T, MD   500 mg at 11/29/21 0806  ? budesonide (PULMICORT) nebulizer solution 0.5 mg  0.5 mg Nebulization Q12H Max Sane, MD   0.5 mg at 11/29/21 0747  ? feeding supplement (ENSURE ENLIVE / ENSURE PLUS) liquid 237 mL  237 mL Oral BID BM Max Sane, MD   237 mL at 11/29/21 0812  ? folic acid (FOLVITE) tablet 1 mg  1 mg Oral Daily Wynetta Fines T, MD   1 mg at 11/29/21 4665  ? hydrALAZINE (APRESOLINE) tablet 25 mg  25 mg Oral Q6H PRN Wynetta Fines T, MD      ? ipratropium-albuterol (DUONEB) 0.5-2.5 (3) MG/3ML nebulizer solution 3 mL  3 mL Nebulization TID Max Sane, MD   3 mL at 11/28/21 1915  ? lithium carbonate capsule 300 mg  300 mg Oral BID Wynetta Fines T, MD   300 mg at 11/29/21 0809  ? LORazepam (ATIVAN) tablet 1-4 mg  1-4 mg Oral Q1H PRN Lequita Halt, MD      ? Or  ? LORazepam (ATIVAN) injection 1-4 mg  1-4 mg Intravenous Q1H PRN Wynetta Fines T, MD      ? meclizine (ANTIVERT) tablet 12.5 mg  12.5 mg Oral TID PRN Lequita Halt, MD      ? mometasone-formoterol [facility]) 200-5 MCG/ACT inhaler 2 puff  2 puff Inhalation BID Lequita Halt, MD   2 puff at 11/29/21 0813  ? multivitamin with minerals tablet 1 tablet  1 tablet Oral Daily Lequita Halt, MD   1 tablet at 11/29/21 9935  ? oxyCODONE-acetaminophen (PERCOCET/ROXICET) 5-325 MG per tablet 1-2 tablet  1-2 tablet Oral Q4H PRN Wynetta Fines T, MD      ? PARoxetine (PAXIL) tablet 20 mg  20 mg Oral Daily Wynetta Fines T, MD   20 mg at 11/29/21 0809  ? predniSONE (DELTASONE) tablet 40 mg  40 mg Oral Q breakfast Wynetta Fines T, MD   40 mg at 11/29/21 0753  ? QUEtiapine (SEROQUEL) tablet 200 mg  200 mg Oral QHS Wynetta Fines T, MD   200 mg at 11/28/21 2349  ? senna-docusate (Senokot-S) tablet 1 tablet  1 tablet Oral QHS PRN Wynetta Fines T, MD      ? simvastatin (ZOCOR) tablet 20 mg  20 mg Oral QHS Wynetta Fines T, MD   20 mg at 11/28/21 2349  ? thiamine tablet 100 mg  100 mg Oral Daily Wynetta Fines T, MD   100 mg at 11/29/21 7017  ? Or  ? thiamine (B-1) injection 100 mg  100 mg Intravenous Daily Lequita Halt, MD      ? ? ? ?Discharge Medications: ?Please see discharge summary for a list of discharge medications. ? ?Relevant Imaging Results: ? ?Relevant Lab Results: ? ? ?Additional Information ?SS [id number] ? ?Pete Pelt, RN ? ? ? ? ?

## 2021-11-29 NOTE — Assessment & Plan Note (Signed)
May be smaller acute infarct in the right parietal lobe.  Subacute/large stroke seen on MRI as well.  Hold Eliquis for next 2 days per neurology.  Continue aspirin and statin for now ?PT, OT, ST -SNF recommended.  TOC working on placement ?Palliative care for goals of care discussion.  Based on my talk with daughter he has been clinically declining over the last 1 year with multiple falls and behavioral disturbances ?

## 2021-11-29 NOTE — Assessment & Plan Note (Signed)
Now resolved.  

## 2021-11-29 NOTE — TOC Progression Note (Signed)
Transition of Care (TOC) - Progression Note  ? ? ?Patient Details  ?Name: Mohd. Derflinger ?MRN: [id number] ?Date of Birth: 21-Nov-1940 ? ?Transition of Care (TOC) CM/SW Contact  ?Pete Pelt, RN ?Phone Number: ?11/29/2021, 3:38 PM ? ?Clinical Narrative:   TOC spoke with daughter [name].  Daughter states that she and patient agree that he should go to SNF.  Daughter states that they prefer patient to be transferred to a facility in [facility], as that is where patient lives.   ?RNCM sent clinical information to the following: ?[facility] phone [phone], fax [phone];  ? ?[facility] [phone], fax [phone];  ? ?[facility] ([phone], fax [phone].   ? ?RNCM left message for [place] [phone]. ? ?Daughter given Medicare list of facilities and aware bed search started. ? ? ? ? ?Expected Discharge Plan: [place] ?Barriers to Discharge: Continued Medical Work up ? ?Expected Discharge Plan and Services ?Expected Discharge Plan: [place] ?  ?Discharge Planning Services: CM Consult ?  ?Living arrangements for the past 2 months: [place] ?                ?  ?  ?  ?  ?  ?  ?  ?  ?  ?  ? ? ?Social Determinants of Health (SDOH) Interventions ?  ? ?Readmission Risk Interventions ?Readmission Risk Prevention Plan 11/28/2021  ?Transportation Screening Complete  ?PCP or Specialist Appt within 5-7 Days Complete  ?Home Care Screening Complete  ?Medication Review (RN CM) Complete  ?Some recent data might be hidden  ? ? ?

## 2021-11-29 NOTE — Assessment & Plan Note (Signed)
Requiring 2 L oxygen via nasal cannula on admission. Now on room air. ?

## 2021-11-29 NOTE — Progress Notes (Signed)
?  Progress Note ? ? ?PatientDelquan Levy LHT:[id number] DOB: 06-Jul-1941 DOA: 11/27/2021     2 ?DOS: the patient was seen and examined on 11/29/2021 ?  ?Brief hospital course: ?81 y.o. male with medical history significant of PAD, chronic A-fib on Eliquis, CKD stage IIIa, HLD, question of COPD, bipolar disorder, cigar smoker, alcohol abuse, came with frequent falls and loss of balance. ? ?3/16 -PT, OT, ST and neuro consult.  Getting CTA head and neck ?3/17: SNF with palliative care at discharge ? ? ?Assessment and Plan: ?* Stroke (cerebrum) ([place]) ?May be smaller acute infarct in the right parietal lobe.  Subacute/large stroke seen on MRI as well.  Hold Eliquis for next 2 days per neurology.  Continue aspirin and statin for now ?PT, OT, ST -SNF recommended.  TOC working on placement ?Palliative care for goals of care discussion.  Based on my talk with daughter he has been clinically declining over the last 1 year with multiple falls and behavioral disturbances ? ?Acute respiratory failure with hypoxia ([place]) ?Now resolved ? ?COPD with acute exacerbation ([place]) ?Requiring 2 L oxygen via nasal cannula on admission. Now on room air. ? ?Malnutrition of moderate degree ?Overall poor prognosis. outpt Palliative care at SNF ? ?Atherosclerosis of artery of extremity with rest pain ([place]) ?Continue aspirin and statin. ? ?CKD (chronic kidney disease) stage 3, GFR 30-59 ml/min (H[facility]) ?At baseline.  Avoid nephrotoxic medications ? ?Centrilobular emphysema ([place]) ?Continue albuterol nebs as needed.  Budesonide, Dulera, prednisone and Zithromax.  Wean oxygen as able ? ?Bipolar 1 disorder ([place]) ?Continue Seroquel ? ? ? ? ?  ? ?Subjective: more awake today, no new issues, nursing reports bradycardia and transient intermittent heart pauses (on tele) but patient asymptomatic ? ?Physical Exam: ?Vitals:  ? 11/29/21 0533 11/29/21 0748 11/29/21 0749 11/29/21 1127  ?BP: (!) 144/76  (!) 154/89 (!) 144/78  ?Pulse: (!) 104 (!) 51 (!) 50 75   ?Resp: 18 (!) '24 20 20  '$ ?Temp: (!) 97.5 ?F (36.4 ?C)  98 ?F (36.7 ?C) 97.6 ?F (36.4 ?C)  ?TempSrc: Oral     ?SpO2: 98% 91% 99% 100%  ?Weight:      ?Height:      ? ?81 year old male lying in the bed pleasantly confused in no acute distress ?Eyes:?PERRL, lids and conjunctivae normal ?ENMT:?Mucous membranes are moist. Posterior pharynx clear of any exudate or lesions.Normal dentition.  ?Neck:?normal, supple, no masses, no thyromegaly ?Respiratory: no crackles. Normal respiratory effort. No accessory muscle use.  ?Cardiovascular:?Regular rate and rhythm, no murmurs / rubs / gallops. No extremity edema ?Abdomen:? Soft, benign ?Skin:?no rashes, lesions, ulcers. No induration ?Neurologic:? Speaks slowly and in single words/short phrases.  Nonfocal.  Awake but confused ?Psychiatric:? Flat affect ? ?Data Reviewed: ? ?There are no new results to review at this time. ? ?Family Communication: Daughter Keith Levy updated over phone ? ?Disposition: ?Status is: Inpatient ?Remains inpatient appropriate because: needs to be off eliquis till tomorrow per Neuro. And restart Saturday ? ? Planned Discharge Destination: Skilled nursing facility ? ? ? ?Time spent: 35 minutes ? ?Author: ?Max Sane, MD ?11/29/2021 11:35 AM ? ?For on call review [email or web address].  ?

## 2021-11-29 NOTE — Assessment & Plan Note (Signed)
At baseline.  Avoid nephrotoxic medications ?

## 2021-11-30 ENCOUNTER — Inpatient Hospital Stay: Payer: Medicare Other

## 2021-11-30 DIAGNOSIS — J441 Chronic obstructive pulmonary disease with (acute) exacerbation: Secondary | ICD-10-CM | POA: Diagnosis not present

## 2021-11-30 DIAGNOSIS — J9601 Acute respiratory failure with hypoxia: Secondary | ICD-10-CM

## 2021-11-30 DIAGNOSIS — Z7189 Other specified counseling: Secondary | ICD-10-CM

## 2021-11-30 DIAGNOSIS — I4891 Unspecified atrial fibrillation: Secondary | ICD-10-CM

## 2021-11-30 DIAGNOSIS — I70229 Atherosclerosis of native arteries of extremities with rest pain, unspecified extremity: Secondary | ICD-10-CM | POA: Diagnosis not present

## 2021-11-30 DIAGNOSIS — G9340 Encephalopathy, unspecified: Secondary | ICD-10-CM

## 2021-11-30 DIAGNOSIS — F1010 Alcohol abuse, uncomplicated: Secondary | ICD-10-CM | POA: Diagnosis not present

## 2021-11-30 DIAGNOSIS — I639 Cerebral infarction, unspecified: Secondary | ICD-10-CM | POA: Diagnosis not present

## 2021-11-30 LAB — COMPREHENSIVE METABOLIC PANEL
ALT: 39 U/L (ref 0–44)
AST: 45 U/L — ABNORMAL HIGH (ref 15–41)
Albumin: 3.5 g/dL (ref 3.5–5.0)
Alkaline Phosphatase: 50 U/L (ref 38–126)
Anion gap: 9 (ref 5–15)
BUN: 30 mg/dL — ABNORMAL HIGH (ref 8–23)
CO2: 19 mmol/L — ABNORMAL LOW (ref 22–32)
Calcium: 10.3 mg/dL (ref 8.9–10.3)
Chloride: 108 mmol/L (ref 98–111)
Creatinine, Ser: 1.27 mg/dL — ABNORMAL HIGH (ref 0.61–1.24)
GFR, Estimated: 58 mL/min — ABNORMAL LOW (ref >60)
Glucose, Bld: 113 mg/dL — ABNORMAL HIGH (ref 70–99)
Potassium: 5.5 mmol/L — ABNORMAL HIGH (ref 3.5–5.1)
Sodium: 136 mmol/L (ref 135–145)
Total Bilirubin: 1.7 mg/dL — ABNORMAL HIGH (ref 0.3–1.2)
Total Protein: 7 g/dL (ref 6.5–8.1)

## 2021-11-30 LAB — CBC
HCT: 43.1 % (ref 39.0–52.0)
Hemoglobin: 13.3 g/dL (ref 13.0–17.0)
MCH: 26.1 pg (ref 26.0–34.0)
MCHC: 30.9 g/dL (ref 30.0–36.0)
MCV: 84.5 fL (ref 80.0–100.0)
Platelets: 257 10*3/uL (ref 150–400)
RBC: 5.1 MIL/uL (ref 4.22–5.81)
RDW: 16.7 % — ABNORMAL HIGH (ref 11.5–15.5)
WBC: 11.9 10*3/uL — ABNORMAL HIGH (ref 4.0–10.5)
nRBC: 0 % (ref 0.0–0.2)

## 2021-11-30 LAB — GLUCOSE, CAPILLARY: Glucose-Capillary: 118 mg/dL — ABNORMAL HIGH (ref 70–99)

## 2021-11-30 LAB — AMMONIA: Ammonia: 23 umol/L (ref 9–35)

## 2021-11-30 LAB — MRSA NEXT GEN BY PCR, NASAL: MRSA by PCR Next Gen: NOT DETECTED

## 2021-11-30 LAB — PHOSPHORUS: Phosphorus: 3.3 mg/dL (ref 2.5–4.6)

## 2021-11-30 LAB — LITHIUM LEVEL: Lithium Lvl: 0.58 mmol/L — ABNORMAL LOW (ref 0.60–1.20)

## 2021-11-30 LAB — MAGNESIUM: Magnesium: 2.7 mg/dL — ABNORMAL HIGH (ref 1.7–2.4)

## 2021-11-30 IMAGING — DX DG CHEST 1V PORT
1 series · 1 of 1 positions shown · non-contrast
Comparison: Radiograph [DATE]

CLINICAL DATA: New onset of hypoxia and delirium.  Recent CVA.

EXAM:
PORTABLE CHEST 1 VIEW

[chest ap]
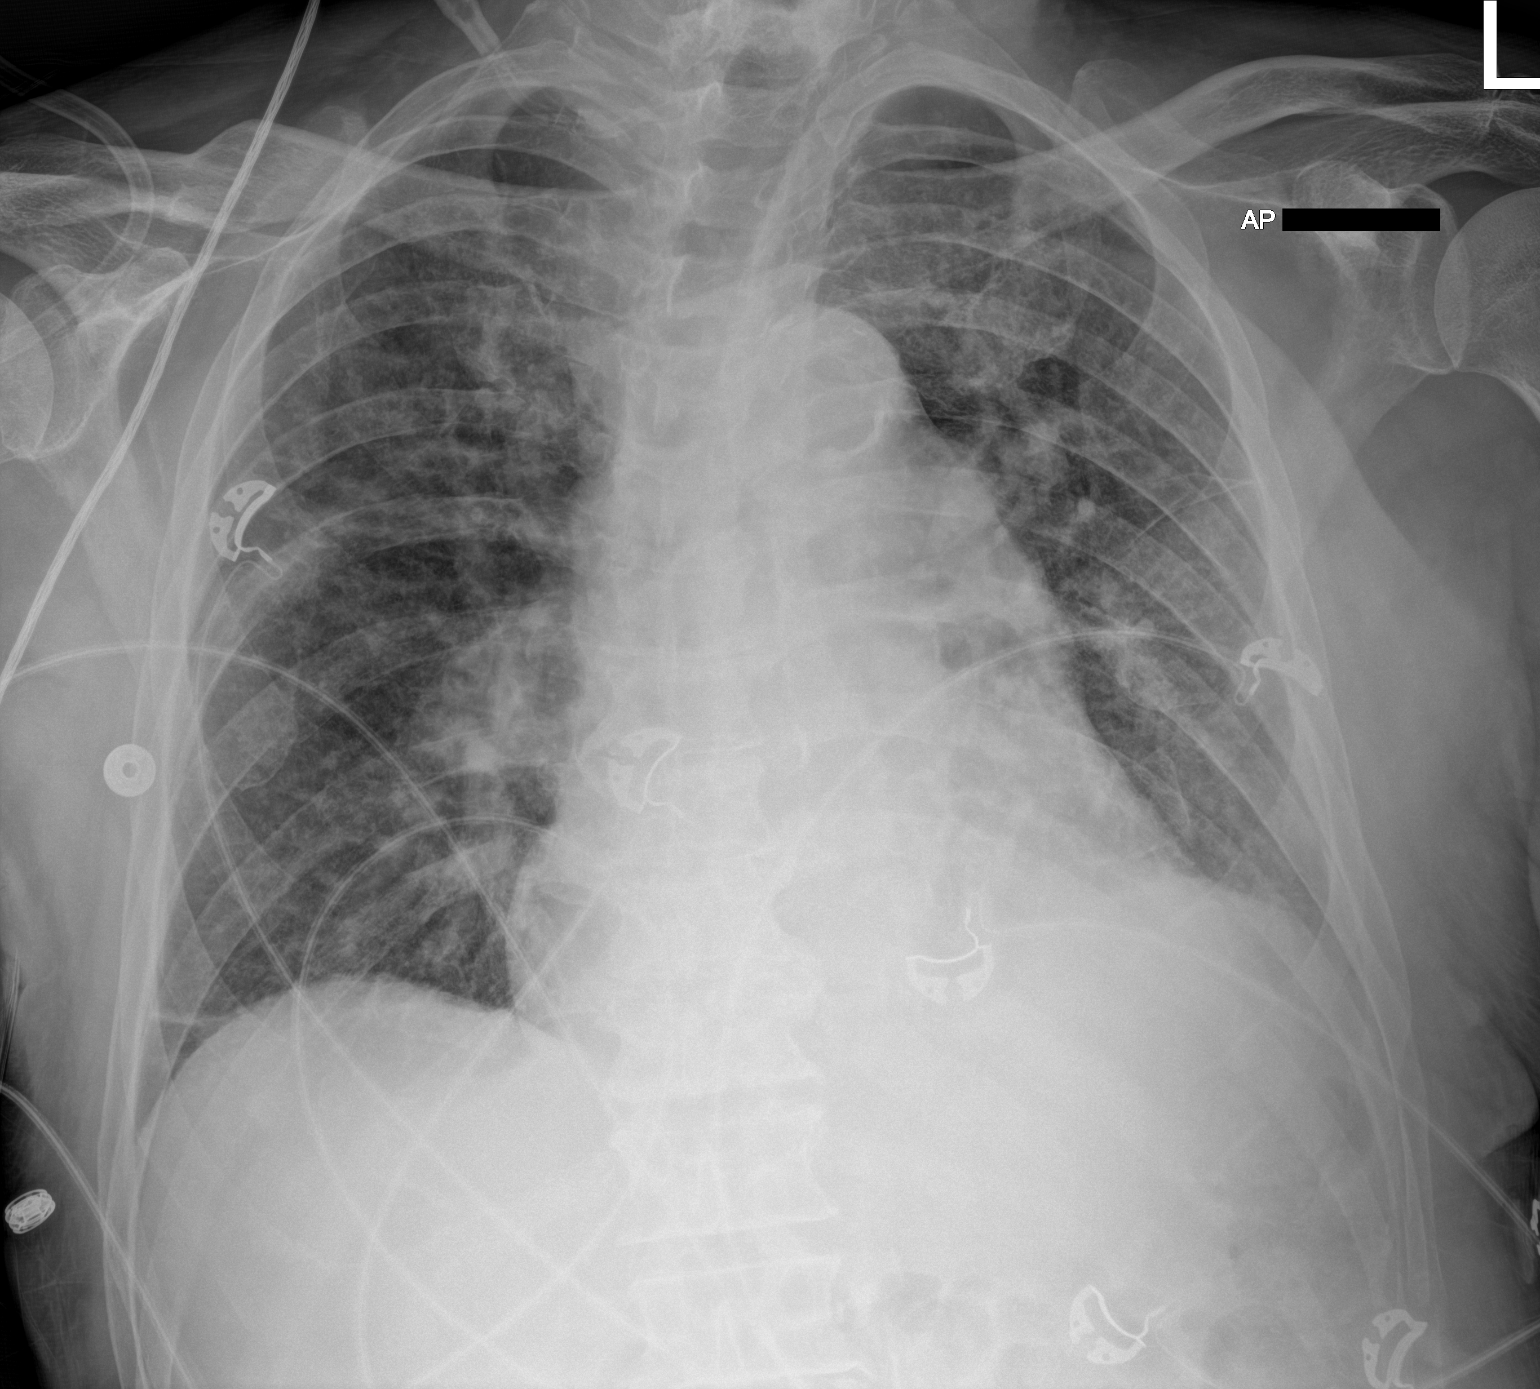

[1 of 1 positions shown; findings below may reference images not displayed]

FINDINGS: Unchanged cardiomegaly. Unchanged mediastinal contours. Aortic
atherosclerosis. Lower lung volumes from prior exam. There is
elevation of left hemidiaphragm with ill-defined left lung base
opacity. Mild diffuse peribronchial and interstitial thickening with
probable Kerley B-lines. Possible left pleural effusion. No
pneumothorax. Patient is rotated. Remote right clavicle fracture.
IMPRESSION: 1. Lower lung volumes.
2. Suspected mild pulmonary edema.
3. Elevated left hemidiaphragm with ill-defined basilar opacity, may
represent atelectasis or aspiration. Possible left pleural effusion.

## 2021-11-30 IMAGING — CT CT HEAD W/O CM
4 series · 16 of 47 positions shown, 18 images · non-contrast
Comparison: CT scan of the brain [DATE]

CLINICAL DATA: Mental status change. Known right MCA infarct
involving the insula and operculum.



[Series 2: head wo · axial · 0.46mm/px · z∈[-216,-66]mm · 7 of 40 slices shown, 9 images]
[im 5/40  brain]
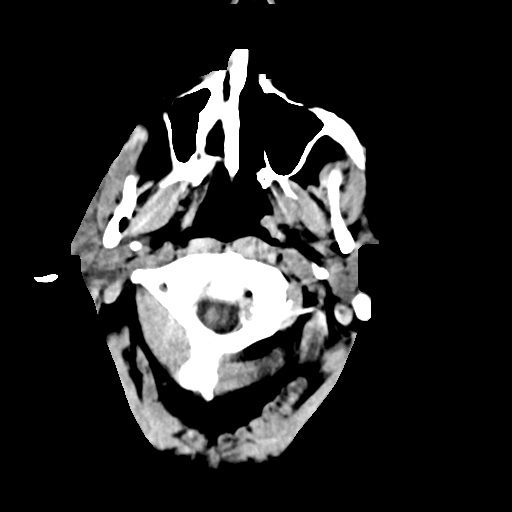
[im 5/40  bone]
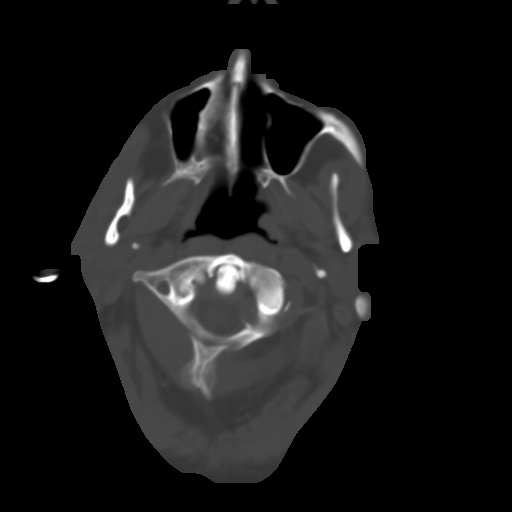
[im 10/40  brain]
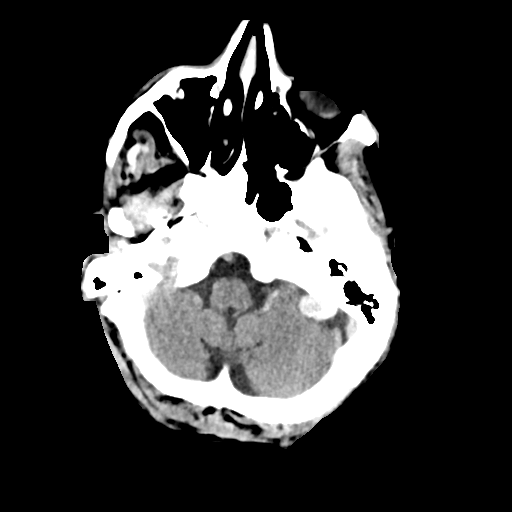
[im 15/40  brain]
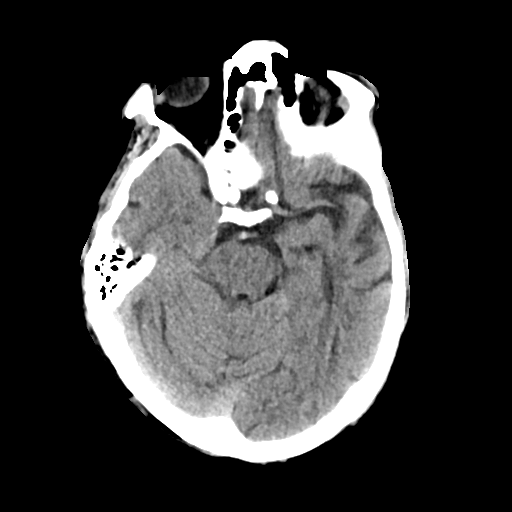
[im 20/40  brain]
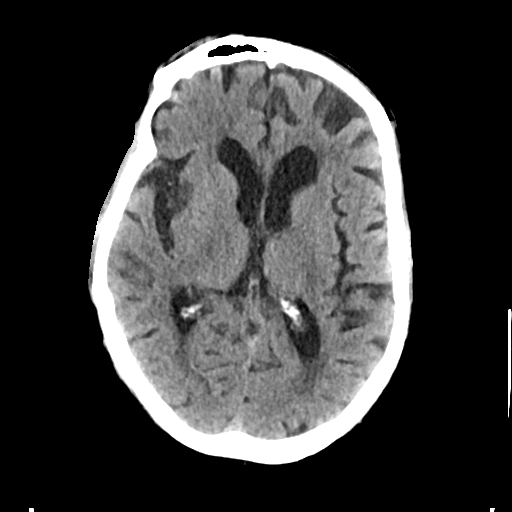
[im 25/40  brain]
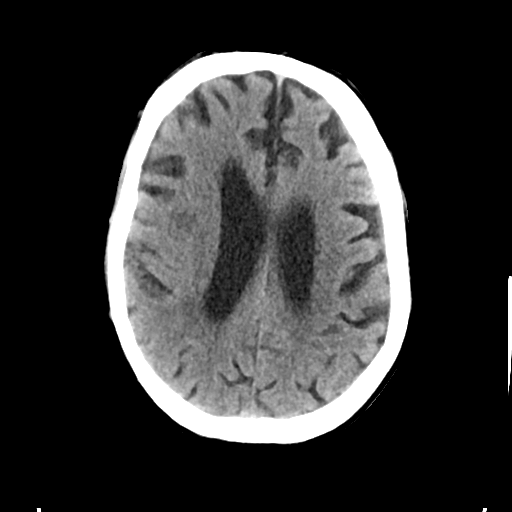
[im 25/40  bone]
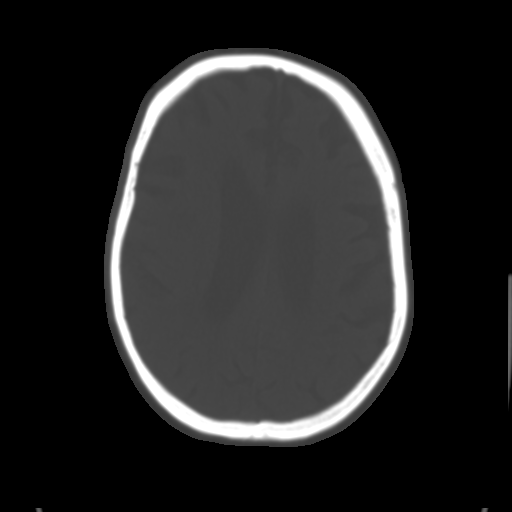
[im 30/40  brain]
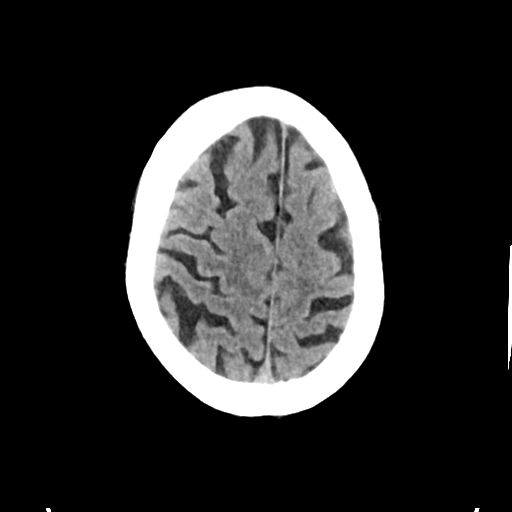
[im 35/40  brain]
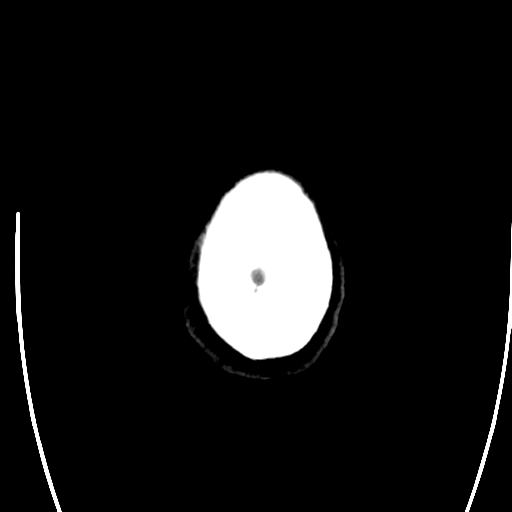

[Series 3: head bone · axial · 0.46mm/px · z∈[-218,-178]mm · 3 of 100 slices shown]
[im 10/100  bone]
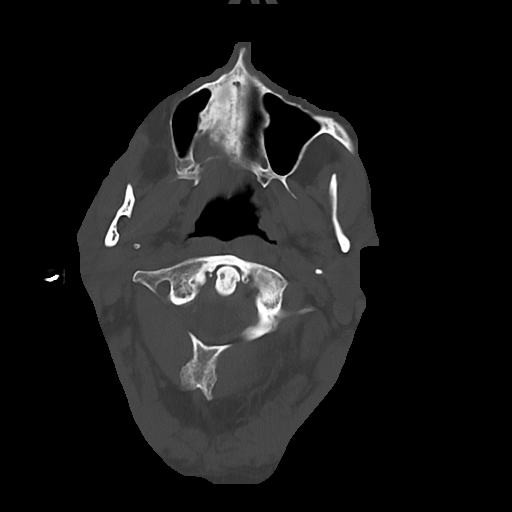
[im 20/100  bone]
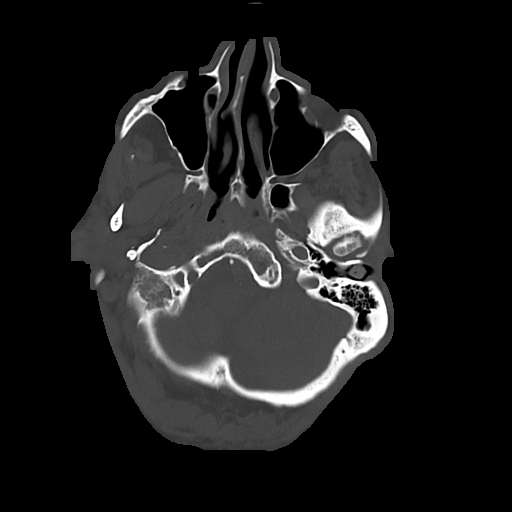
[im 30/100  bone]
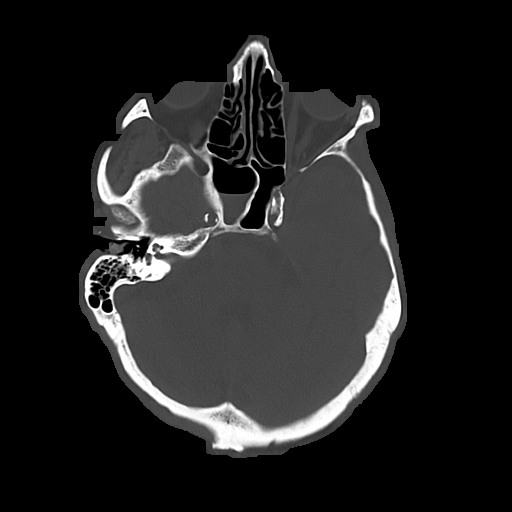

[Series 4: cor soft · coronal · 0.37mm/px · 3 of 82 slices shown]
[im 28/82  brain]
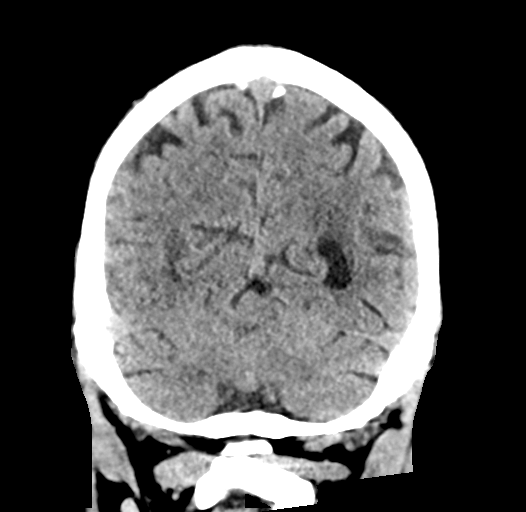
[im 37/82  brain]
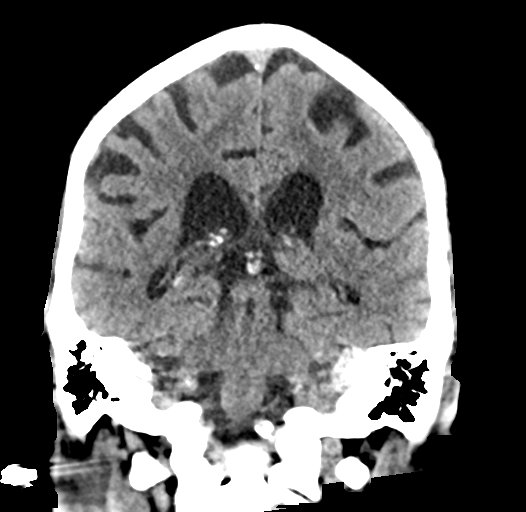
[im 46/82  brain]
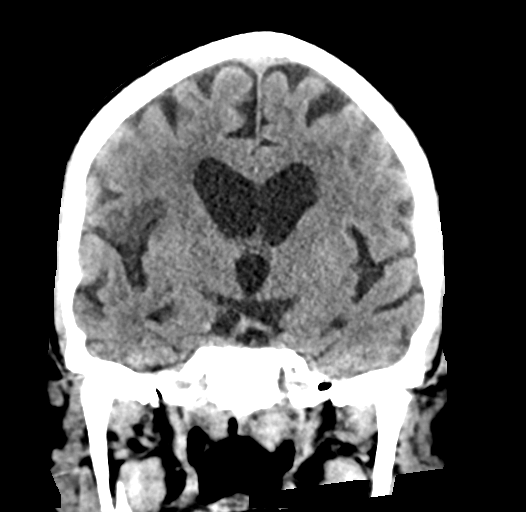

[Series 5: sag soft · sagittal · 0.37mm/px · 3 of 63 slices shown]
[im 24/63  brain]
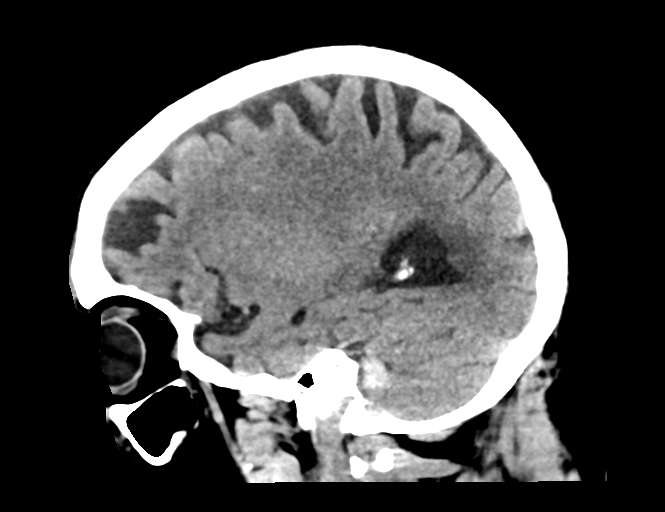
[im 33/63  brain]
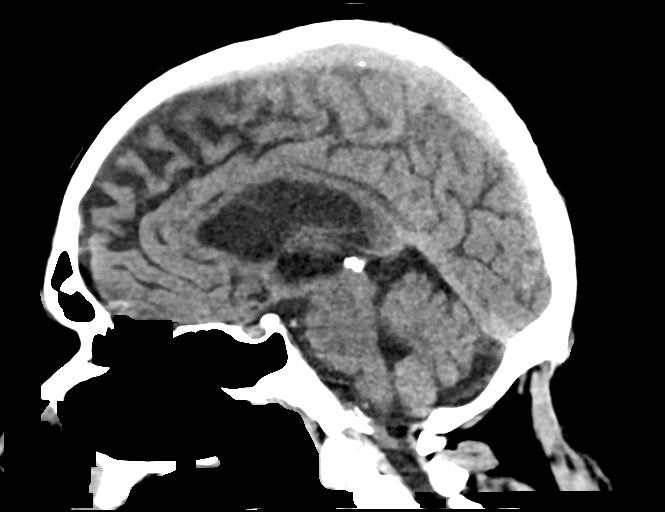
[im 41/63  brain]
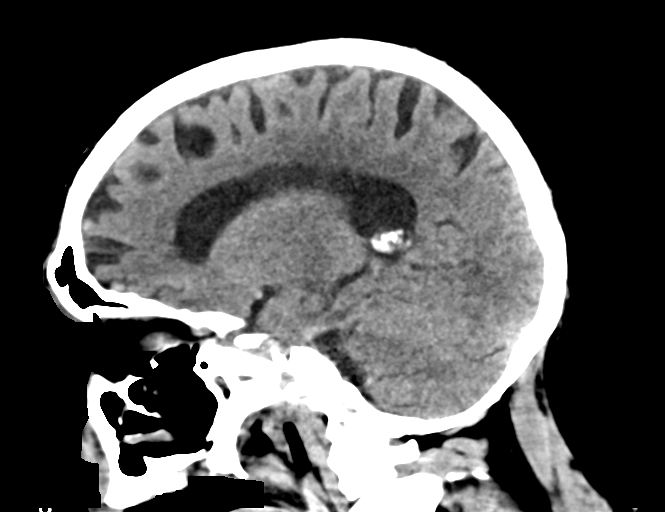

[16 of 47 positions shown; findings below may reference images not displayed]

FINDINGS: Brain: No subdural, epidural, or subarachnoid hemorrhage. The known
right MCA territory infarct involving the insular cortex and the
operculum is stable. No midline shift. The ventricles and sulci are
mildly prominent but stable. No interval infarcts noted. Cerebellum,
brainstem, and basal cisterns are normal. A left CP angle calcified
masses likely meningioma. No other acute interval changes.

Vascular: Calcified atherosclerosis in the intracranial carotids.

Skull: Normal. Negative for fracture or focal lesion.

Sinuses/Orbits: Fluid in the right sphenoid sinus is stable.
Paranasal sinuses otherwise unchanged and unremarkable.

Other: None.
IMPRESSION: 1. The known right MCA territory infarct involving the insula and
frontal operculum is stable. No interval hemorrhage.
2. Probable meningioma in the left CP angle.

## 2021-11-30 MED ORDER — IPRATROPIUM-ALBUTEROL 0.5-2.5 (3) MG/3ML IN SOLN
3.0000 mL | Freq: Four times a day (QID) | RESPIRATORY_TRACT | Status: DC
Start: 2021-11-30 — End: 2021-12-01
  Administered 2021-11-30 – 2021-12-01 (×3): 3 mL via RESPIRATORY_TRACT
  Filled 2021-11-30 (×3): qty 3

## 2021-11-30 MED ORDER — INSULIN ASPART 100 UNIT/ML IV SOLN
10.0000 [IU] | Freq: Once | INTRAVENOUS | Status: AC
  Administered 2021-11-30: 10 [IU] via INTRAVENOUS
  Filled 2021-11-30: qty 0.1

## 2021-11-30 MED ORDER — DEXTROSE 50 % IV SOLN
1.0000 | Freq: Once | INTRAVENOUS | Status: AC
  Administered 2021-11-30: 50 mL via INTRAVENOUS
  Filled 2021-11-30: qty 50

## 2021-11-30 MED ORDER — CHLORHEXIDINE GLUCONATE CLOTH 2 % EX PADS
6.0000 | MEDICATED_PAD | Freq: Every day | CUTANEOUS | Status: DC
  Administered 2021-12-01: 6 via TOPICAL

## 2021-11-30 MED ORDER — PHENOBARBITAL SODIUM 130 MG/ML IJ SOLN
130.0000 mg | INTRAMUSCULAR | Status: AC
  Administered 2021-11-30 (×2): 130 mg via INTRAVENOUS
  Filled 2021-11-30 (×2): qty 1

## 2021-11-30 MED ORDER — NICOTINE 21 MG/24HR TD PT24
21.0000 mg | MEDICATED_PATCH | Freq: Every day | TRANSDERMAL | Status: DC
  Administered 2021-11-30 – 2021-12-01 (×2): 21 mg via TRANSDERMAL
  Filled 2021-11-30 (×2): qty 1

## 2021-11-30 MED ORDER — SPIRITUS FRUMENTI
1.0000 | Freq: Every day | ORAL | Status: DC
  Administered 2021-11-30: 1 via ORAL
  Filled 2021-11-30: qty 1

## 2021-11-30 MED ORDER — FOLIC ACID 5 MG/ML IJ SOLN
1.0000 mg | Freq: Every day | INTRAMUSCULAR | Status: DC
  Administered 2021-12-01: 1 mg via INTRAVENOUS
  Filled 2021-11-30: qty 0.2

## 2021-11-30 MED ORDER — LORAZEPAM 2 MG/ML IJ SOLN
0.5000 mg | INTRAMUSCULAR | Status: DC | PRN
  Filled 2021-11-30: qty 1

## 2021-11-30 MED ORDER — THIAMINE HCL 100 MG/ML IJ SOLN
500.0000 mg | INTRAVENOUS | Status: DC
  Administered 2021-11-30: 500 mg via INTRAVENOUS
  Filled 2021-11-30: qty 5

## 2021-11-30 MED ORDER — DM-GUAIFENESIN ER 30-600 MG PO TB12
1.0000 | ORAL_TABLET | Freq: Two times a day (BID) | ORAL | Status: DC
  Administered 2021-11-30: 1 via ORAL
  Filled 2021-11-30: qty 1

## 2021-11-30 MED ORDER — SODIUM CHLORIDE 0.9 % IV SOLN
3.0000 g | Freq: Three times a day (TID) | INTRAVENOUS | Status: DC
  Administered 2021-11-30 – 2021-12-01 (×3): 3 g via INTRAVENOUS
  Filled 2021-11-30 (×3): qty 3

## 2021-11-30 MED ORDER — THIAMINE HCL 100 MG/ML IJ SOLN
500.0000 mg | Freq: Three times a day (TID) | INTRAVENOUS | Status: DC
  Administered 2021-12-01: 500 mg via INTRAVENOUS
  Filled 2021-11-30 (×2): qty 5

## 2021-11-30 MED ORDER — SODIUM CHLORIDE 0.9 % IV SOLN: 250.0000 mg | Freq: Every day | INTRAVENOUS | Status: DC

## 2021-11-30 MED ORDER — SODIUM CHLORIDE 0.9 % IV SOLN
260.0000 mg | Freq: Once | INTRAVENOUS | Status: AC
  Administered 2021-11-30: 260 mg via INTRAVENOUS
  Filled 2021-11-30: qty 2

## 2021-11-30 MED ORDER — SODIUM BICARBONATE 8.4 % IV SOLN
50.0000 meq | Freq: Once | INTRAVENOUS | Status: AC
Start: 2021-11-30 — End: 2021-11-30
  Administered 2021-11-30: 50 meq via INTRAVENOUS
  Filled 2021-11-30: qty 50

## 2021-11-30 MED ORDER — FUROSEMIDE 10 MG/ML IJ SOLN
40.0000 mg | Freq: Once | INTRAMUSCULAR | Status: AC
  Administered 2021-11-30: 40 mg via INTRAVENOUS
  Filled 2021-11-30: qty 4

## 2021-11-30 MED ORDER — ASPIRIN 300 MG RE SUPP
300.0000 mg | Freq: Every day | RECTAL | Status: DC
  Administered 2021-11-30 – 2021-12-01 (×2): 300 mg via RECTAL
  Filled 2021-11-30 (×4): qty 1

## 2021-11-30 MED ORDER — ASPIRIN 81 MG PO CHEW: 81.0000 mg | CHEWABLE_TABLET | Freq: Every day | ORAL | Status: DC

## 2021-11-30 MED ORDER — FOLIC ACID 5 MG/ML IJ SOLN
1.0000 mg | Freq: Once | INTRAMUSCULAR | Status: AC
  Administered 2021-11-30: 1 mg via INTRAVENOUS
  Filled 2021-11-30: qty 0.2

## 2021-11-30 MED ORDER — THIAMINE HCL 100 MG/ML IJ SOLN: 100.0000 mg | Freq: Every day | INTRAMUSCULAR | Status: DC

## 2021-11-30 MED ORDER — FOLIC ACID 5 MG/ML IJ SOLN: 1.0000 mg | Freq: Once | INTRAMUSCULAR | Status: DC

## 2021-11-30 NOTE — Assessment & Plan Note (Addendum)
Actively withdrawing now.  Continue CIWA protocol.  I have also ordered beer per daughter's request although I doubt he would be able to drink it ?

## 2021-11-30 NOTE — Plan of Care (Signed)
?  Interdisciplinary Goals of Care Family Meeting ? ? ?Date carried out: 11/30/2021 ? ?Location of the meeting: Bedside ? ?Member's involved: Physician and Bedside Registered Nurse ? ?Durable Power of Technical sales engineer maker: Keith Levy    ? ?Discussion: We discussed goals of care for [facility] . Informed pts daughter Keith Levy via telephone pts condition has declined significantly from a respiratory and neurological standpoint.  Confirmed code status pt is DO NOT RESUSCITATE. Discussed concern regarding possible new acute CVA and pt now with worsening acute hypoxic respiratory failure secondary to aspiration pneumonitis along with suspected COPD exacerbation.  Regarding goals of care Mrs. Levy would like to continue current treatment at this time, and if possible she would like to see if pt has had a new CVA.  Informed Mrs. Levy an MRI Brain has been ordered, however pt would not tolerate an MRI at this time.  I strongly suggested Mrs. Levy come to pts bedside due to pts HIGH RISK FOR SUDDEN DEATH.  Mrs. Levy acknowledged the update.  ? ?Code status: Full DNR ? ?Disposition: Continue current acute care ? ?Time spent for the meeting: 15 minutes  ? ? ?Keith Levy, AGNP  ?Pulmonary/Critical Care ?Pager [phone] (please enter 7 digits) ?PCCM Consult Pager [phone] (please enter 7 digits)  ? ? ?

## 2021-11-30 NOTE — Assessment & Plan Note (Signed)
Continue Seroquel. 

## 2021-11-30 NOTE — Consult Note (Signed)
? ?NAME:  Keith Levy, MRN:  [id number], DOB:  May 06, 1941, LOS: 3 ?ADMISSION DATE:  11/27/2021, CONSULTATION DATE: 03/18/ ?2023 REFERRING MD: Dr. Manuella Ghazi , CHIEF COMPLAINT: Alcohol Withdrawal   ? ?History of Present Illness:  ?This is a 81 yo male who presented to [facility] ER on 03/15 with a 5 day hx of frequent falls during which he hit his head x1.  He went to his outpatient PCP for evaluation on 03/15.  At that time an outpatient CT Head revealed an acute right MCA infarct, therefore pt instructed to proceed to the ER. HPI obtained from pt chart due to pts inability to provide information due to current acute encephalopathy.  ? ?ED Course ?Upon arrival to the ER lab results revealed glucose 105, creatinine 1.32, AST 71, total bilirubin 1.7.  MRI Brain revealed infarct in the lateral right frontal lobe; anterior right MCA territory infarct; focal acute infarct in the right parietal lobe may suggest embolic etiology;  ? and 1.6 cm meningioma along the left tentorium.  ED provider spoke with on call Neurologist Dr. Quinn Axe per consultation note on 03/16 eliquis held with recommendation to continue aspirin for hx of PVD. Pt admitted to the medsurg unit per hospitalist team for additional workup and treatment.  Hospital course complicated by ETOH withdrawal symptoms.  Pt required transfer to the stepdown unit on 03/18.  PCCM team consulted to assist with management.   ? ?Pertinent  Medical History  ?Bipolar Disorder  ?Stage III CKD  ?HLD ?PAD  ?Pre-Diabetes  ?ETOH Abuse  ? ?Significant Hospital Events: ?Including procedures, antibiotic start and stop dates in addition to other pertinent events   ?03/15: Pt admitted to the [facility] unit with subacute R frontal ischemic infarct and small acute R parietal infarct as well as L CPA meningioma ?03/15: MRA Head Neck/MR Brain revealed infarct in the lateral right frontal lobe, favored to be at least partially subacute which involves the insula and operculum, consistent with the  anterior right MCA territory infarct seen on the same-day CT head. Additional focal acute infarct in the right parietal lobe may suggest an embolic etiology, given slightly different vascular supply. 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 Non opacification of the proximal intracranial right vertebral artery. No other intracranial large vessel occlusion. Multifocal narrowing in the anterior greater than posterior right MCA branches, left MCA branches, and left A1/A2. Some of this may be artifactual, secondary to motion. The neck MRA is of limited utility, given motion and missed contrast bolus. Within this limitation, the right vertebral artery appears to occluded for the entirety of its course, of indeterminate acuity. Remaining neck vasculature appears patent, although evaluation of the bifurcations is particularly limited. If there is clinical indication for evaluation of the carotid bifurcations or right vertebral artery, a CTA of the head and neck is recommended. ?03/16: CTA Head/Neck revealed no large vessel occlusion intracranially. Attenuated right MCA branch vessels distally in the region of known infarct. Moderate bilateral paraclinoid ICA stenosis. Moderate to severe stenosis of the dominant intradural left vertebral artery. Some opacification of the small intradural right vertebral artery likely from collateral flow given occlusion in the neck, detailed below. CTA neck: Occluded right vertebral artery origin with non- opacification in the neck. Severe stenosis of the left vertebral artery origin. Approximately 60% stenosis of the left common carotid artery origin. Bilateral carotid bifurcation atherosclerosis with  approximately 50% right and 40% left ICA origin stenosis. Aortic Atherosclerosis (ICD10-I70.0) and Emphysema (ICD10-J43.9). ?03/16: Echo revealed EF 60 to  65%, moderate to severe mitral valve regurgitation, mild to moderate tricuspid valve regurgitation, and mild aortic valve regurgitation   ?03/16: Palliative Care Consult to discuss goals of care and code status.  Pts code status changed to DNR  ?03/18: CT Head revealed The known right MCA territory infarct   involving the insula and frontal operculum is stable. No interval hemorrhage. 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 ?03/18: Pt transferred to the [facility] with DT's phenobarbital taper initiated and with acute hypoxic respiratory failure secondary to AECOPD and aspiration pneumonitis.  Along with incomprehensible speech with severe tremors concerning for new acute CVA  ? ?Interim History / Subjective:  ?Pt in severe acute respiratory distress respiratory rate mid 30's currently requiring 2L O2 via nasal canula.  Pt with severe tremors, confusion, and incomprehensible speech ? ?Objective   ?Blood pressure (!) 166/105, pulse (!) 53, temperature 98.3 ?F (36.8 ?C), resp. rate (!) 26, height 6' (1.829 m), weight 83.5 kg, SpO2 (!) 89 %. ?   ?   ? ?Intake/Output Summary (Last 24 hours) at 11/30/2021 1624 ?Last data filed at 11/30/2021 1322 ?Gross per 24 hour  ?Intake 240 ml  ?Output 550 ml  ?Net -310 ml  ? ?Filed Weights  ? 11/27/21 1710  ?Weight: 83.5 kg  ? ?Examination: ?General: Acute on chronically ill appearing male, moderate respiratory distress ?HENT: Supple, mild JVD  ?Lungs: Diffuse wheezes and rhonchi throughout, tachypneic with use of abdominal muscles to breath  ?Cardiovascular: Irregular irregular, no R/G, 2+ radial/1+ distal pulses, no edema  ?Abdomen: +BS x4, soft, non tender, non distended ?Extremities: Moves all extremities, extremely tremulous  ?Neuro: Incomprehensible speech, follows commands, PERRLA  ?GU: Voiding via male purewick  ? ?Resolved Hospital Problem list   ? ?Assessment & Plan:  ? ?Acute encephalopathy secondary to ETOH withdrawal (drinks estimated 1/2 bottle of vodka daily) and possible new acute CVA  ?Hx: Bipolar disorder  ?- Will start phenobarbital taper ?- Lithium level pending  ?- Continue mvi and folic acid,  and start high dose thiamine  ?- Once able to tolerate po's resume outpatient psychiatric medications  ?- Will need ETOH cessation counseling once mentation improves  ? 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 ?Chronic atrial fibrillation ?Hx: PAD  ?   Echo 11/28/21 revealed EF 60 to 65%, moderate to severe mitral valve regurgitation, mild to moderate tricuspid valve regurgitation, and mild aortic valve regurgitation  ?- Continuous telemetry monitoring  ?- Will continue to hold eliquis per neurology recommendations  ? ?Acute hypoxic respiratory failure secondary to suspected COPD exacerbation given smoking hx (pt has not had PFT's) and aspiration pneumonitis  ?Hx: Current everyday smoker  ?- Supplemental O2 for dyspnea and/or hypoxia ?- Scheduled and prn bronchodilator therapy  ?- Nebulized steroids; will discontinue steroids due to delirium/ ?agitation  ?- Will start unasyn ?- Trend WBC and monitor fever curve ?- Aspiration precautions  ?- Will need smoking cessation counseling  ?- Will need pulmonology follow-up in the outpatient setting and PFT's  ? ?Stage III CKD  ?- Trend BMP ?- Replace electrolytes as indicated  ?- Monitor UOP ?- Avoid nephrotoxic medications when able  ? ?Best Practice (right click and "Reselect all SmartList Selections" daily)  ? ?Diet/type: NPO ?DVT prophylaxis: SCD ?GI prophylaxis: N/A ?Lines: N/A ?Foley:  N/A ?Code Status:  DNR ?Last date of multidisciplinary goals of care discussion [N/A] ? ?See plan of care note regarding goals of care and code status  ? ?Labs   ?CBC: ?Recent Labs  ?Lab 11/27/21 ?1702  ?WBC 8.3  ?NEUTROABS 5.9  ?HGB 13.1  ?HCT 42.8  ?MCV 85.1  ?PLT 276  ? ? ?Basic Metabolic Panel: ?Recent Labs  ?Lab 11/27/21 ?1702  ?NA 137  ?K 3.5  ?CL 104  ?CO2 24  ?GLUCOSE 105*  ?BUN 23  ?CREATININE  1.39*  ?CALCIUM 9.9  ? ?GFR: ?Estimated Creatinine Clearance: 48.1 mL/min (A) (by C-G formula based on SCr of 1.39 mg/dL (H)). ?Recent Labs  ?Lab 11/27/21 ?1702  ?WBC 8.3  ? ? ?Liver Function Tests: ?Recent

## 2021-11-30 NOTE — Progress Notes (Addendum)
Assisted tele visit to patient with family member. ?Family never accepted link, beside RN attempted to called with no answer. Call ended. ? ?Lenox Ahr, RN   ?

## 2021-11-30 NOTE — Progress Notes (Signed)
Addendum 2052: Patient transferred to ICU in hypoxic respiratory failure. He is currently DNAR. MRI brain was ordered to r/o new ischemic infarct but he is not currently stable for MRI. He is on unasyn for aspiration PNA and profoundly encephalopathic. Bradycardia prohibits precedex per ICU. Agree with phenobarb taper if indicated. Will continue to follow. ? ? ?Su Monks, MD ?[facility] ?[phone] ? ?If 7pm- 7am, please page neurology on call as listed in [place]. ? ?** ? ?S: Patient with worsening dysarthria, encephalopathy, and tremulousness. Given worsening dysarthria, anticoagulation restart held awaiting repeat MRI brain r/o new ischemic event. Patient requiring escalating doses of ativan on CIWA for EtOH withdrawal. ? ?Head CT 3/18 no new intracranial abnl, stable evolution of known recent infarcts. ? ?O: ? ?Vitals:  ? 11/30/21 1900 11/30/21 2030  ?BP: (!) 166/102   ?Pulse: (!) 51   ?Resp: (!) 31   ?Temp:    ?SpO2: 98% 99%  ? ?Gen: patient agitated, not oriented, does not follow simple commands ?CV: RRR ?Resp: normal WOB ? ?MS: patient agitated, not oriented, does not follow simple commands ?Speech: moderate dysarthria ?CN: PERRL, blinks to threat bilat, EOMI, L UMN facial droop ?Motor: at least 4/5 strength in all extremities when trying to climb out of bed ?Sensory: SILT ?Coordination, gait: UTA ? ?NIHSS ?  ?1a Level of Conscious.: 0 ?1b LOC Questions: 2 ?1c LOC Commands: 2 ?2 Best Gaze: 0 ?3 Visual: 0 ?4 Facial Palsy: 1 ?5a Motor Arm - left: 1 ?5b Motor Arm - Right: 1 ?6a Motor Leg - Left: 1 ?6b Motor Leg - Right: 1 ?7 Limb Ataxia: 0 ?8 Sensory: 0 ?9 Best Language: 0 ?10 Dysarthria: 2 ?11 Extinct. and Inatten.: 0 ?  ?TOTAL: 11 (from 6 on 3/16) ? ?A/P: This is a 81 year old gentleman with past medical history of a fib on eliquis, bipolar disorder, CKD stage III, hyperlipidemia, PAD and prediabetes who is admitted with multiple falls over the past 3 days and was found to have subacute R  frontal ischemic infarct and small acute R parietal infarct as well as L CPA meningioma. R frontal infarct appearance favors stroke occurrence at sx onset 3 days prior to admission; smaller infarct appears acute since that time. Given multiple strokes of different ages in slightly different cerebrovascular territories, favor embolic source, likely central given known a fib. Suspect patient's frequent falls 2/2 apraxia related to R parietal infarct given he does not have significant focal weakness on examination. L cerebellar meningioma could also be contributing to imbalance, though this should not have caused acute change. ? ?Plan was to hold anticoagulation for total of 5 days from larger infarct and restart today 3/18. Head CT this AM did rule out interval hemorrhagic conversion. However patient with worsening dysarthria, dysphagia, encephalopathy c/f possible new ischemic event. Hold anticoagulation pending MRI brain r/o acute infarct. ? ?Patient is now in prominent w/d from EtOH. Increase thiamine to Wernicke dosing, continue CIWA. ? ?- Repeat MRI brain wo contrast ?- Restart permissive HTN x48 hrs until acute infarct ruled out by MRI goal BP <220/110. PRN labetalol or hydralazine if BP above these parameters. Avoid oral antihypertensives. ?- Hold anticoagulation restart until new infarct ruled out by MRI brain. Restart ASA '81mg'$  daily until anticoagulation restarted. ?- NPO until repeat SLP eval ?- q4 hr neuro checks ?- STAT head CT for any change in neuro exam ?- Tele ?- PT/OT/SLP ?- Stroke education ?- Amb referral to neurology upon discharge  ? ?# EtOH withdrawal ?- Escalate  thiamine to Wernicke dosing '500mg'$  IV q 8 hrs ?- CIWA per primary team ? ?Will continue to follow. ? ?Su Monks, MD ?[facility] ?[phone] ? ?If 7pm- 7am, please page neurology on call as listed in [place]. ? ? ?

## 2021-11-30 NOTE — Consult Note (Signed)
Pharmacy Antibiotic Note ? ?Keith Levy is a 81 y.o. male admitted on 11/27/2021 with c/f aspiration pneumonia.  Pharmacy has been consulted for Unasyn dosing. ? ?Plan: ?Transition from Azithromycin >> Unasyn w/ c/f Aspiration pneumonia. ?+ initiate Unasyn 3g IV Q8H  ? ?Height: 6' (182.9 cm) ?Weight: 84.8 kg (186 lb 15.2 oz) ?IBW/kg (Calculated) : 77.6 ? ?Temp (24hrs), Avg:97.9 ?F (36.6 ?C), Min:96.9 ?F (36.1 ?C), Max:98.3 ?F (36.8 ?C) ? ?Recent Labs  ?Lab 11/27/21 ?1702  ?WBC 8.3  ?CREATININE 1.39*  ?  ?Estimated Creatinine Clearance: 48.1 mL/min (A) (by C-G formula based on SCr of 1.39 mg/dL (H)).   ? ?No Known Allergies ? ?Antimicrobials this admission: ?Azithromycin x4d ( 3/15>> 3/18) ?Unasyn (3/18 >>  ? ?Dose adjustments this admission: ?CTM and adjust PRN. Current Scr is 1.39 (BL is ~1.2-1.3) ? ?Microbiology results: ?3/18 MRSA PCR: sent ? ?Thank you for allowing pharmacy to be a part of this patient?s care. ? ?Shanon Brow [place] Enke ?11/30/2021 6:06 PM ? ?

## 2021-11-30 NOTE — Assessment & Plan Note (Addendum)
Comfort care/ hospice ?

## 2021-11-30 NOTE — Progress Notes (Signed)
Physical Therapy Treatment ?Patient Details ?Name: Keith Levy ?MRN: [id number] ?DOB: [birth year] ?Today's Date: 11/30/2021 ? ? ?History of Present Illness Pt is a 81 y.o. male presenting to hospital 3/15.  Per MD note "According to the patient and daughter over the past 5 days the patient has been experiencing multiple falls which is atypical for the patient.  He was seen by his doctor and ordered an outpatient head CT that was performed today showing acute or subacute infarct in the patient was sent immediately to the emergency department.".  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  Pt admitted with R MCA stroke, frequent falls, and acute COPD exacerbation.  PMH includes s/p 9/722 R femoral endarterectomy, R iliac thrombectomy, insertion of SFA and popliteal stents, and insertion of iliac stent B; bipolar disorder, HLD, PAD, LE angio, stents B LE's, a-fib, and COPD. ? ?  ?PT Comments  ? ? Patient resting in bed at start of session. Patient needed min A for supine <> sit and sit <> stand with RW at edge of bed today. Patient had intermittently elevated RR that increased to 34 during session. He was unable to say words during the session but intermittently followed cuing.  Telemetry noted Vtach when standing up and at times when moving back to bed and multiple PVC or unreadable rhythm at times. Patient also appeared to become more and less responsive during the session with decreased responsiveness following when telemetry read Vtach. Returned patient to bed due to clinical presentation with high RR and telemetry readings. Patient required total A+2 (assisted by NT) to boost up in bed. RN updated on patient's response to PT and noted he is going through withdrawals and has had [place]. Although patient did worse with functional mobility today, he will likely have improved function when withdrawals and effects of medication improve. Patient  would benefit from skilled physical therapy to address impairments and functional limitations (see PT Problem List below) to work towards stated goals and return to PLOF or maximal functional independence.  ? ?  ?Recommendations for follow up therapy are one component of a multi-disciplinary discharge planning process, led by the attending physician.  Recommendations may be updated based on patient status, additional functional criteria and insurance authorization. ? ?Follow Up Recommendations ? Skilled nursing-short term rehab (<3 hours/day) ?  ?  ?Assistance Recommended at Discharge Frequent or constant Supervision/Assistance  ?Patient can return home with the following A little help with walking and/or transfers;A little help with bathing/dressing/bathroom;Assistance with cooking/housework;Assist for transportation;Help with stairs or ramp for entrance ?  ?Equipment Recommendations ? Rolling walker (2 wheels);BSC/3in1  ?  ?Recommendations for Other Services   ? ? ?  ?Precautions / Restrictions Precautions ?Precautions: Fall ?Precaution Comments: Seizure precautions ?Restrictions ?Weight Bearing Restrictions: No  ?  ? ?Mobility ? Bed Mobility ?Overal bed mobility: Needs Assistance ?Bed Mobility: Supine to Sit, Sit to Supine ?  ?  ?Supine to sit: Min assist ?Sit to supine: Min assist ?  ?General bed mobility comments: Patient performed suping to sit with min A, reaching for support from PT with UEs. Pateint went sit to supine min A. He was total A x 2 for boost up bed. ?  ? ?Transfers ?Overall transfer level: Needs assistance ?Equipment used: Rolling walker (2 wheels) ?Transfers: Sit to/from Stand ?Sit to Stand: Min assist ?  ?  ?  ?  ?  ?General transfer comment: Patient completed sit <>  stand at edge of bed. Telemetry noted Vtach and patient RR rate up to 35 so assisted patient to sit back down. Pateint had some trouble following commands and did not seem as alert after standing up. ?  ? ?Ambulation/Gait ?  ?  ?   ?  ?  ?  ?  ?General Gait Details: no ambulation this session 2nd to telemetry, RR, clinical presentation. ? ? ?Stairs ?  ?  ?  ?  ?  ? ? ?Wheelchair Mobility ?  ? ?Modified Rankin (Stroke Patients Only) ?  ? ? ?  ?Balance Overall balance assessment: Needs assistance ?Sitting-balance support: No upper extremity supported, Feet supported ?Sitting balance-Leahy Scale: Fair ?Sitting balance - Comments: a little unsteady in sitting ?  ?Standing balance support: Bilateral upper extremity supported, During functional activity, Reliant on assistive device for balance ?Standing balance-Leahy Scale: Poor ?Standing balance comment: Patient unsteady on feet and difficulty following commands or standing up straight ?  ?  ?  ?  ?  ?  ?  ?  ?  ?  ?  ?  ? ?  ?Cognition Arousal/Alertness:  (intermittantly alert and less responsive) ?Behavior During Therapy: [facility] for tasks assessed/performed ?Overall Cognitive Status: No family/caregiver present to determine baseline cognitive functioning ?  ?  ?  ?  ?  ?  ?  ?  ?  ?  ?  ?  ?  ?  ?  ?  ?General Comments: Pateint was not speaking in coherant language, only able to groan this session. ?  ?  ? ?  ?Exercises   ? ?  ?General Comments General comments (skin integrity, edema, etc.): RR up to 34, Telemetry reporting Vtach, multiple PVCs, and inabiltiy to read rythm while patient appeared to demo variable alertness during session. ?  ?  ? ?Pertinent Vitals/Pain Pain Assessment ?Pain Assessment: Faces ?Faces Pain Scale: No hurt  ? ? ?Home Living   ?  ?  ?  ?  ?  ?  ?  ?  ?  ?   ?  ?Prior Function    ?  ?  ?   ? ?PT Goals (current goals can now be found in the care plan section) Acute Rehab PT Goals ?Patient Stated Goal: to improve strength and balance ?PT Goal Formulation: With patient ?Time For Goal Achievement: 12/12/21 ?Potential to Achieve Goals: Good ?Progress towards PT goals: Not progressing toward goals - comment (Patient limited by level of consciousness and cardiopulmonary function  today) ? ?  ?Frequency ? ? ? 7X/week ? ? ? ?  ?PT Plan Current plan remains appropriate  ? ? ?Co-evaluation   ?  ?  ?  ?  ? ?  ?AM-PAC PT "6 Clicks" Mobility   ?Outcome Measure ? Help needed turning from your back to your side while in a flat bed without using bedrails?: A Little ?Help needed moving from lying on your back to sitting on the side of a flat bed without using bedrails?: A Little ?Help needed moving to and from a bed to a chair (including a wheelchair)?: A Little ?Help needed standing up from a chair using your arms (e.g., wheelchair or bedside chair)?: A Little ?Help needed to walk in hospital room?: A Little ?Help needed climbing 3-5 steps with a railing? : A Lot ?6 Click Score: 17 ? ?  ?End of Session Equipment Utilized During Treatment: Gait belt;Oxygen (3L/min) ?Activity Tolerance: Treatment limited secondary to medical complications (Comment) (RR up to 34, Telemetry  showing multiple PVCs, Vtach, or unreadable. Patient's level of alertness seeming to fluctuate) ?Patient left: in bed;with call bell/phone within reach;with family/visitor present ?Nurse Communication: Mobility status;Other (comment) (telemetry, alertness, RR) ?PT Visit Diagnosis: Unsteadiness on feet (R26.81);Other abnormalities of gait and mobility (R26.89);Muscle weakness (generalized) (M62.81);History of falling (Z91.81) ?  ? ? ?Time: 1220-1240 ?PT Time Calculation (min) (ACUTE ONLY): 20 min ? ?Charges:  $Therapeutic Activity: 8-22 mins          ?          ?Everlean Alstrom. Graylon Good, PT, DPT ?11/30/21, 1:09 PM ? ? ? ?

## 2021-11-30 NOTE — Assessment & Plan Note (Signed)
Continue aspirin and statin. 

## 2021-11-30 NOTE — Assessment & Plan Note (Signed)
May be smaller acute infarct in the right parietal lobe.  Subacute/large stroke seen on MRI as well.  Hold Eliquis for next 2 days per neurology.  Continue aspirin and statin for now ?PT, OT, ST -SNF recommended.  TOC working on placement ?Palliative care for goals of care discussion.  Based on my talk with daughter he has been clinically declining over the last 1 year with multiple falls and behavioral disturbances -planning palliative care at [facility] upon discharge ?

## 2021-11-30 NOTE — Progress Notes (Addendum)
?Progress Note ? ? ?PatientBarret Levy JGO:[id number] DOB: 09/22/1940 DOA: 11/27/2021     3 ?DOS: the patient was seen and examined on 11/30/2021 ?  ?Brief hospital course: ?81 y.o. male with medical history significant of PAD, chronic A-fib on Eliquis, CKD stage IIIa, HLD, question of COPD, bipolar disorder, cigar smoker, alcohol abuse, came with frequent falls and loss of balance. ? ?3/16 -PT, OT, ST and neuro consult.  Getting CTA head and neck ?3/17: SNF with palliative care at discharge ?3/18: Alcohol withdrawal, getting CT head and MRI brain for further eval ? ? ?Assessment and Plan: ?* Stroke (cerebrum) ([place]) ?May be smaller acute infarct in the right parietal lobe.  Subacute/large stroke seen on MRI as well.  Hold Eliquis for next 2 days per neurology.  Continue aspirin and statin for now ?PT, OT, ST -SNF recommended.  TOC working on placement ?Palliative care for goals of care discussion.  Based on my talk with daughter he has been clinically declining over the last 1 year with multiple falls and behavioral disturbances -planning palliative care at [facility] upon discharge ?CT head stat as he had slurred speech and getting MRI of the brain later today per neurology ? ?Goals of care, counseling/discussion ?I had discussion with patient's daughter Keith about his clinical condition and goals of care.  She is in agreement to keep him comfortable and treated treatable.  If all goes well plan is to discharge him to SNF with outpatient palliative care although she is aware that he could have acute event and could die here as well.  Patient has had episodes of intermittent bradycardia and sinus pauses.  He is also going through acute alcohol withdrawal and has stroke which puts him at very high risk for cardiorespiratory arrest and death ? ?Acute respiratory failure with hypoxia ([place]) ?Now resolved ? ?COPD with acute exacerbation ([place]) ?Requiring 2 L oxygen via nasal cannula on admission. Now on room  air. ? ?Malnutrition of moderate degree ?Overall poor prognosis. outpt Palliative care at SNF ? ?Atherosclerosis of artery of extremity with rest pain ([place]) ?Continue aspirin and statin. ? ?Tobacco use disorder ?Nicotine patch ordered ? ?Chronic alcohol abuse ?Actively withdrawing now.  Continue CIWA protocol.  I have also ordered beer per daughter's request although I doubt he would be able to drink it ? ?CKD (chronic kidney disease) stage 3, GFR 30-59 ml/min (HCC) ?At baseline.  Avoid nephrotoxic medications ? ?Centrilobular emphysema ([place]) ?Continue albuterol nebs as needed.  Budesonide, Dulera, prednisone and Zithromax.  Wean oxygen as able.  Add Mucinex ?He is likely continuously aspirating ? ?Bipolar 1 disorder ([place]) ?Continue Seroquel ? ? ? ? ?  ? ?Subjective: Tremulous and shaking, confused ? ?Physical Exam: ?Vitals:  ? 11/30/21 0000 11/30/21 0406 11/30/21 0848 11/30/21 1223  ?BP:  (!) 149/83 (!) 154/95 (!) 169/92  ?Pulse: 64 (!) 57 60 (!) 55  ?Resp:  14 (!) 24 (!) 24  ?Temp:  98.2 ?F (36.8 ?C) (!) 97.4 ?F (36.3 ?C) 98.3 ?F (36.8 ?C)  ?TempSrc:  Oral    ?SpO2:  99% 97% 99%  ?Weight:      ?Height:      ? ?81 year old male lying in the bed pleasantly confused and tremulous ?Eyes: PERRL, lids and conjunctivae normal ?ENMT: Mucous membranes are moist. Posterior pharynx clear of any exudate or lesions.Normal dentition.  ?Neck: normal, supple, no masses, no thyromegaly ?Respiratory: no crackles. Normal respiratory effort. No accessory muscle use.  ?Cardiovascular: Regular rate and rhythm, no murmurs /  rubs / gallops. No extremity edema ?Abdomen:  Soft, benign ?Skin: no rashes, lesions, ulcers. No induration ?Neurologic:  Nonfocal.  Awake but confused ?Psychiatric:  Tremulous and shaky ? ?Data Reviewed: ? ?There are no new results to review at this time. ? ?Family Communication: Updated daughter Levy over phone ? ?Disposition: ?Status is: Inpatient ?Remains inpatient appropriate because: Acute alcohol  withdrawal, stroke work-up and pending SNF ? ? Planned Discharge Destination: Skilled nursing facility ? ? ? DVT prophylaxis-SCDs ?Time spent: 35 minutes ? ?Author: ?Max Sane, MD ?11/30/2021 1:45 PM ? ?For on call review [email or web address]  ?

## 2021-11-30 NOTE — Assessment & Plan Note (Signed)
I had discussion with patient's daughter Matranga about his clinical condition and goals of care.  She is in agreement to keep him comfortable and treated treatable.  If all goes well plan is to discharge him to SNF with outpatient palliative care although she is aware that he could have acute event and could die here as well.  Patient has had episodes of intermittent bradycardia and sinus pauses.  He is also going through acute alcohol withdrawal and has stroke which puts him at very high risk for cardiorespiratory arrest and death ?

## 2021-11-30 NOTE — Assessment & Plan Note (Signed)
Nicotine patch ordered.

## 2021-11-30 NOTE — Assessment & Plan Note (Addendum)
Continue albuterol nebs as needed.  Budesonide, Dulera, prednisone and Zithromax.  Wean oxygen as able.  Add Mucinex ?He is likely continuously aspirating ?

## 2021-11-30 NOTE — Assessment & Plan Note (Signed)
Now resolved.  

## 2021-11-30 NOTE — Assessment & Plan Note (Signed)
At baseline.  Avoid nephrotoxic medications ?

## 2021-11-30 NOTE — Assessment & Plan Note (Signed)
Requiring 2 L oxygen via nasal cannula on admission. Now on room air. ?

## 2021-11-30 NOTE — Assessment & Plan Note (Signed)
Overall poor prognosis. outpt Palliative care at SNF ?

## 2021-12-01 DIAGNOSIS — E440 Moderate protein-calorie malnutrition: Secondary | ICD-10-CM | POA: Diagnosis not present

## 2021-12-01 DIAGNOSIS — J441 Chronic obstructive pulmonary disease with (acute) exacerbation: Secondary | ICD-10-CM | POA: Diagnosis not present

## 2021-12-01 DIAGNOSIS — Z7189 Other specified counseling: Secondary | ICD-10-CM | POA: Diagnosis not present

## 2021-12-01 LAB — COMPREHENSIVE METABOLIC PANEL
ALT: 41 U/L (ref 0–44)
AST: 42 U/L — ABNORMAL HIGH (ref 15–41)
Albumin: 3.9 g/dL (ref 3.5–5.0)
Alkaline Phosphatase: 54 U/L (ref 38–126)
Anion gap: 8 (ref 5–15)
BUN: 31 mg/dL — ABNORMAL HIGH (ref 8–23)
CO2: 27 mmol/L (ref 22–32)
Calcium: 10.5 mg/dL — ABNORMAL HIGH (ref 8.9–10.3)
Chloride: 103 mmol/L (ref 98–111)
Creatinine, Ser: 1.39 mg/dL — ABNORMAL HIGH (ref 0.61–1.24)
GFR, Estimated: 52 mL/min — ABNORMAL LOW (ref >60)
Glucose, Bld: 109 mg/dL — ABNORMAL HIGH (ref 70–99)
Potassium: 3.9 mmol/L (ref 3.5–5.1)
Sodium: 138 mmol/L (ref 135–145)
Total Bilirubin: 1.8 mg/dL — ABNORMAL HIGH (ref 0.3–1.2)
Total Protein: 7.4 g/dL (ref 6.5–8.1)

## 2021-12-01 LAB — CBC
HCT: 41 % (ref 39.0–52.0)
Hemoglobin: 12.6 g/dL — ABNORMAL LOW (ref 13.0–17.0)
MCH: 26.1 pg (ref 26.0–34.0)
MCHC: 30.7 g/dL (ref 30.0–36.0)
MCV: 85.1 fL (ref 80.0–100.0)
Platelets: 302 10*3/uL (ref 150–400)
RBC: 4.82 MIL/uL (ref 4.22–5.81)
RDW: 16.5 % — ABNORMAL HIGH (ref 11.5–15.5)
WBC: 14.7 10*3/uL — ABNORMAL HIGH (ref 4.0–10.5)
nRBC: 0 % (ref 0.0–0.2)

## 2021-12-01 LAB — PHOSPHORUS: Phosphorus: 3.3 mg/dL (ref 2.5–4.6)

## 2021-12-01 LAB — MAGNESIUM: Magnesium: 2.4 mg/dL (ref 1.7–2.4)

## 2021-12-01 MED ORDER — GLYCOPYRROLATE 1 MG PO TABS
1.0000 mg | ORAL_TABLET | ORAL | Status: DC | PRN
  Filled 2021-12-01: qty 1

## 2021-12-01 MED ORDER — MORPHINE SULFATE (PF) 2 MG/ML IV SOLN
2.0000 mg | INTRAVENOUS | Status: DC | PRN
  Administered 2021-12-01: 4 mg via INTRAVENOUS
  Administered 2021-12-01: 2 mg via INTRAVENOUS
  Administered 2021-12-01: 3 mg via INTRAVENOUS
  Administered 2021-12-02 – 2021-12-03 (×3): 4 mg via INTRAVENOUS
  Filled 2021-12-01 (×3): qty 2
  Filled 2021-12-01: qty 1
  Filled 2021-12-01 (×2): qty 2

## 2021-12-01 MED ORDER — ACETAMINOPHEN 650 MG RE SUPP: 650.0000 mg | Freq: Four times a day (QID) | RECTAL | Status: DC | PRN

## 2021-12-01 MED ORDER — PHENOBARBITAL SODIUM 130 MG/ML IJ SOLN
130.0000 mg | INTRAMUSCULAR | Status: AC
  Administered 2021-12-01 (×2): 130 mg via INTRAVENOUS
  Filled 2021-12-01 (×2): qty 1

## 2021-12-01 MED ORDER — GLYCOPYRROLATE 0.2 MG/ML IJ SOLN: 0.2000 mg | INTRAMUSCULAR | Status: DC | PRN

## 2021-12-01 MED ORDER — SODIUM CHLORIDE 0.9 % IV SOLN
260.0000 mg | Freq: Once | INTRAVENOUS | Status: AC
  Administered 2021-12-01: 260 mg via INTRAVENOUS
  Filled 2021-12-01: qty 2

## 2021-12-01 MED ORDER — DIPHENHYDRAMINE HCL 50 MG/ML IJ SOLN: 25.0000 mg | INTRAMUSCULAR | Status: DC | PRN

## 2021-12-01 MED ORDER — HALOPERIDOL LACTATE 5 MG/ML IJ SOLN: 2.5000 mg | INTRAMUSCULAR | Status: DC | PRN

## 2021-12-01 MED ORDER — LORAZEPAM 2 MG/ML IJ SOLN
2.0000 mg | INTRAMUSCULAR | Status: DC | PRN
  Administered 2021-12-01 (×2): 2 mg via INTRAVENOUS
  Administered 2021-12-02: 4 mg via INTRAVENOUS
  Administered 2021-12-02: 2 mg via INTRAVENOUS
  Administered 2021-12-03: 4 mg via INTRAVENOUS
  Filled 2021-12-01: qty 2
  Filled 2021-12-01 (×3): qty 1
  Filled 2021-12-01: qty 2

## 2021-12-01 MED ORDER — POLYVINYL ALCOHOL 1.4 % OP SOLN
1.0000 [drp] | Freq: Four times a day (QID) | OPHTHALMIC | Status: DC | PRN
  Filled 2021-12-01: qty 15

## 2021-12-01 MED ORDER — GLYCOPYRROLATE 0.2 MG/ML IJ SOLN
0.2000 mg | INTRAMUSCULAR | Status: DC | PRN
  Administered 2021-12-01 – 2021-12-03 (×2): 0.2 mg via INTRAVENOUS
  Filled 2021-12-01 (×2): qty 1

## 2021-12-01 MED ORDER — ACETAMINOPHEN 325 MG PO TABS: 650.0000 mg | ORAL_TABLET | Freq: Four times a day (QID) | ORAL | Status: DC | PRN

## 2021-12-01 NOTE — Assessment & Plan Note (Signed)
Comfort care/ hospice ?

## 2021-12-01 NOTE — Assessment & Plan Note (Signed)
I had discussion with patient's daughter [name] last several days and again this morning based on his worsening clinical condition.  She is in agreement to keep him full comfort care and pursue hospice home in [place] family convenience. ?

## 2021-12-01 NOTE — Progress Notes (Signed)
SLP Cancellation Note ? ?Patient Details ?Name: Keith Levy ?MRN: [id number] ?DOB: 09/05/1941 ? ? ?Cancelled treatment:       Reason Eval/Treat Not Completed: Patient not medically ready  ? ?Per chart review, pt GOC are now comfort/hospice as pt actively dying. SLP to sign off as pt has no acute SLP needs at this time. ? ?Cherrie Gauze, M.S., CCC-SLP ?Speech-Language Pathologist ?[facility] ?[phone] ([place]) ? ?[name] ?12/01/2021, 12:32 PM ?

## 2021-12-01 NOTE — TOC Progression Note (Signed)
Transition of Care (TOC) - Progression Note  ? ? ?Patient Details  ?Name: Keith Levy ?MRN: [id number] ?Date of Birth: 16-Oct-1940 ? ?Transition of Care (TOC) CM/SW Contact  ?[place], LCSWA ?Phone Number: ?12/01/2021, 12:55 PM ? ?Clinical Narrative:    ? ?CSW faxed requested materials to [place]. ? ? ?Expected Discharge Plan: [place] ?Barriers to Discharge: Continued Medical Work up ? ?Expected Discharge Plan and Services ?Expected Discharge Plan: [place] ?  ?Discharge Planning Services: CM Consult ?  ?Living arrangements for the past 2 months: [place] ?                ?  ?  ?  ?  ?  ?  ?  ?  ?  ?  ? ? ?Social Determinants of Health (SDOH) Interventions ?  ? ?Readmission Risk Interventions ?Readmission Risk Prevention Plan 11/28/2021  ?Transportation Screening Complete  ?PCP or Specialist Appt within 5-7 Days Complete  ?Home Care Screening Complete  ?Medication Review (RN CM) Complete  ?Some recent data might be hidden  ? ? ?

## 2021-12-01 NOTE — Plan of Care (Signed)
Patient is now on comfort care measures only. Neurology to sign off, but please re-engage if additional questions arise. ? ?Su Monks, MD ?[facility] ?[phone] ? ?If 7pm- 7am, please page neurology on call as listed in [place]. ? ?

## 2021-12-01 NOTE — Progress Notes (Signed)
SLP Cancellation Note ? ?Patient Details ?Name: Keith Levy ?MRN: [id number] ?DOB: 09-Mar-1941 ? ? ?Cancelled treatment:       Reason Eval/Treat Not Completed: Medical issues which prohibited therapy;Patient not medically ready  ? ?Per chart review, pt transferred to ICU with concerns for new acute CVA and acute hypoxic respiratory failure since initial SLP evaluation on 3/16. Noted pt NPO with no source of nutrition; however, no SLP order for swallowing evaluation. Per RN, pt not appropriate for SLP at this time. Encouraged RN to place order for swallowing evaluation as appropriate.  ? ?SLP to defer efforts at this time. SLP will continue to follow patient. RN aware an in agreement. ? ?Cherrie Gauze, M.S., CCC-SLP ?Speech-Language Pathologist ?[facility] ?[phone] ([place])  ? ?[name] ?12/01/2021, 8:36 AM ?

## 2021-12-01 NOTE — Plan of Care (Signed)
?  Problem: Self-Care: ?Goal: Ability to communicate needs accurately will improve ?Outcome: Progressing ?  ?Problem: Nutrition: ?Goal: Risk of aspiration will decrease ?Outcome: Progressing ?  ?Problem: Education: ?Goal: Knowledge of disease or condition will improve ?Outcome: Not Progressing ?Goal: Knowledge of secondary prevention will improve ([place] ALL) ?Outcome: Not Progressing ?  ?Problem: Self-Care: ?Goal: Ability to participate in self-care as condition permits will improve ?Outcome: Not Progressing ?  ?Problem: Education: ?Goal: Knowledge of General Education information will improve ?Description: Including pain rating scale, medication(s)/side effects and non-pharmacologic comfort measures ?Outcome: Not Progressing ?  ?

## 2021-12-01 NOTE — Progress Notes (Signed)
PT Cancellation Note ? ?Patient Details ?Name: Keith Levy ?MRN: [id number] ?DOB: 06/11/1941 ? ? ?Cancelled Treatment:    Reason Eval/Treat Not Completed: Medical issues which prohibited therapy (Per chart review, patient noted with transfer to CCU due to decline in respiratory status.  Per guidelines, will require new orders to resume PT services.  Will complete initial order; please re-consult as medically appropriate.) ? ?Jacquel Mccamish H. Owens Shark, PT, DPT, NCS ?12/01/21, 10:01 AM ?[phone] ? ?

## 2021-12-01 NOTE — TOC Progression Note (Addendum)
Transition of Care (TOC) - Progression Note  ? ? ?Patient Details  ?Name: Keith Levy ?MRN: [id number] ?Date of Birth: Jun 14, 1941 ? ?Transition of Care (TOC) CM/SW Contact  ?[place], LCSWA ?Phone Number: ?12/01/2021, 12:04 PM ? ?Clinical Narrative:    ? ?CSW spoke to patient's daughter to learn home hospice preferences. Daughter reported she would prefer to work with [facility] staff she has been working with previously. She reported preferring [facility] in [place]. ? ?CSW called [facility] [phone]) and they reported they will be in contact with patient's daughter [name]. CSW let [name] know they will be in contact. ? ?Nicole Kindred from [facility] called CSW and requested patient records. CSW is Special educational needs teacher now. ? ?Expected Discharge Plan: [place] ?Barriers to Discharge: Continued Medical Work up ? ?Expected Discharge Plan and Services ?Expected Discharge Plan: [place] ?  ?Discharge Planning Services: CM Consult ?  ?Living arrangements for the past 2 months: [place] ?                ?  ?  ?  ?  ?  ?  ?  ?  ?  ?  ? ? ?Social Determinants of Health (SDOH) Interventions ?  ? ?Readmission Risk Interventions ?Readmission Risk Prevention Plan 11/28/2021  ?Transportation Screening Complete  ?PCP or Specialist Appt within 5-7 Days Complete  ?Home Care Screening Complete  ?Medication Review (RN CM) Complete  ?Some recent data might be hidden  ? ? ?

## 2021-12-01 NOTE — Progress Notes (Signed)
?  Progress Note ? ? ?PatientHelen Cuff QQV:[id number] DOB: October 24, 1940 DOA: 11/27/2021     4 ?DOS: the patient was seen and examined on 12/01/2021 ?  ?Brief hospital course: ?81 y.o. male with medical history significant of PAD, chronic A-fib on Eliquis, CKD stage IIIa, HLD, question of COPD, bipolar disorder, cigar smoker, alcohol abuse, came with frequent falls and loss of balance. ? ?3/16 -PT, OT, ST and neuro consult.  Getting CTA head and neck ?3/17: SNF with palliative care at discharge ?3/18: Alcohol withdrawal, getting CT head and MRI brain for further eval ?3/19: Full comfort care and hospice home eval ? ? ?Assessment and Plan: ?* Stroke (cerebrum) ([place]) ?Comfort care/ hospice ? ?Goals of care, counseling/discussion ?I had discussion with patient's daughter [name] last several days and again this morning based on his worsening clinical condition.  She is in agreement to keep him full comfort care and pursue hospice home in [place] family convenience. ? ?Acute respiratory failure with hypoxia ([place]) ?Comfort care/ hospice ? ?COPD with acute exacerbation ([place]) ?Comfort care/ hospice ? ?Malnutrition of moderate degree ?Comfort care/ hospice ? ?Atherosclerosis of artery of extremity with rest pain ([place]) ?Comfort care/ hospice ? ?Tobacco use disorder ?Comfort care/ hospice ? ?Chronic alcohol abuse ?Comfort care/ hospice ? ?CKD (chronic kidney disease) stage 3, GFR 30-59 ml/min (HCC) ?Comfort care/ hospice ? ?Centrilobular emphysema ([place]) ?Comfort care/ hospice ? ?Bipolar 1 disorder ([place]) ?Comfort care/ hospice ? ?His prognosis is less than 2 weeks ? ? ?  ? ?Subjective: Actively dying ? ?Physical Exam: ?Vitals:  ? 12/01/21 0900 12/01/21 1000 12/01/21 1100 12/01/21 1200  ?BP: (!) 171/97 (!) 161/83 (!) 134/118   ?Pulse: 64   70  ?Resp: 19 (!) 24 (!) 26 (!) 28  ?Temp:      ?TempSrc:      ?SpO2: 99%   97%  ?Weight:      ?Height:      ? ?81 year old male lying in the bed seem to be actively dying ?Eyes:?PERRL,  lids and conjunctivae normal ?ENMT:?Mucous membranes are moist. Posterior pharynx clear of any exudate or lesions.Normal dentition.  ?Neck:?normal, supple, no masses, no thyromegaly ?Respiratory:?no crackles.  Accessory muscles of respiration ?Cardiovascular:?Regular rate and rhythm, no murmurs / rubs / gallops. No extremity edema ?Abdomen:??Soft, benign ?Skin:?no rashes, lesions, ulcers. No induration ?Neurologic:?Stuporous ? ?Data Reviewed: ? ?There are no new results to review at this time. ? ?Family Communication: Had discussion with patient's daughter Brenneman over phone ? ?Disposition: ?Status is: Inpatient ?Remains inpatient appropriate because: Actively dying ? ? Planned Discharge Destination: Hospice home ? ? ? ?Time spent: 35 minutes ? ?Author: Max Sane, MD ?12/01/2021 12:28 PM ? ?For on call review [email or web address]  ?

## 2021-12-02 DIAGNOSIS — Z7189 Other specified counseling: Secondary | ICD-10-CM | POA: Diagnosis not present

## 2021-12-02 DIAGNOSIS — E440 Moderate protein-calorie malnutrition: Secondary | ICD-10-CM | POA: Diagnosis not present

## 2021-12-02 DIAGNOSIS — J9601 Acute respiratory failure with hypoxia: Secondary | ICD-10-CM | POA: Diagnosis not present

## 2021-12-02 NOTE — TOC Progression Note (Signed)
Transition of Care (TOC) - Progression Note  ? ? ?Patient Details  ?Name: Keith Levy ?MRN: [id number] ?Date of Birth: [birth year] ? ?Transition of Care (TOC) CM/SW Contact  ?Pete Pelt, RN ?Phone Number: ?12/02/2021, 1:59 PM ? ?Clinical Narrative:  [facility] contacted RNCM, stating that patient was eligible for a residential bed at $225/day with 2 week up front payment.  Patient can stay there up to 6 weeks.  RNCM notified daughter.  Daughter asked if patient could be placed closer to their home in another hospice agency. ? ?TOC contacted [place] with [facility], states she will submit patient for [facility].  Lorayne Bender will contact TOC when feedback is received.  ? ? ? ?Expected Discharge Plan: [place] ?Barriers to Discharge: Continued Medical Work up ? ?Expected Discharge Plan and Services ?Expected Discharge Plan: [place] ?  ?Discharge Planning Services: CM Consult ?  ?Living arrangements for the past 2 months: [place] ?                ?  ?  ?  ?  ?  ?  ?  ?  ?  ?  ? ? ?Social Determinants of Health (SDOH) Interventions ?  ? ?Readmission Risk Interventions ?Readmission Risk Prevention Plan 11/28/2021  ?Transportation Screening Complete  ?PCP or Specialist Appt within 5-7 Days Complete  ?Home Care Screening Complete  ?Medication Review (RN CM) Complete  ?Some recent data might be hidden  ? ? ?

## 2021-12-02 NOTE — Assessment & Plan Note (Signed)
Comfort care/ hospice ?

## 2021-12-02 NOTE — Progress Notes (Signed)
Nutrition Brief Note  Chart reviewed. Pt now transitioning to comfort care.  No further nutrition interventions planned at this time.  Please re-consult as needed.   Rickardo Brinegar W, RD, LDN, CDCES Registered Dietitian II Certified Diabetes Care and Education Specialist Please refer to AMION for RD and/or RD on-call/weekend/after hours pager   

## 2021-12-02 NOTE — Progress Notes (Signed)
?   12/02/21 1100  ?Clinical Encounter Type  ?Visited With Health care provider  ?Visit Type Initial  ?Referral From Physician  ? ?Chaplain responded to [facility] consult to facilitate Advance Directive however care provider indicated that patient is on comfort care and unable to complete form. ?

## 2021-12-02 NOTE — Progress Notes (Addendum)
?  Progress Note ? ? ?PatientKristoff Levy OXB:[id number] DOB: February 19, 1941 DOA: 11/27/2021     5 ?DOS: the patient was seen and examined on 12/02/2021 ?  ?Brief hospital course: ?81 y.o. male with medical history significant of PAD, chronic A-fib on Eliquis, CKD stage IIIa, HLD, question of COPD, bipolar disorder, cigar smoker, alcohol abuse, came with frequent falls and loss of balance. ? ?3/16 -PT, OT, ST and neuro consult.  Getting CTA head and neck ?3/17: SNF with palliative care at discharge ?3/18: Alcohol withdrawal, getting CT head and MRI brain for further eval ?3/19-3/20: Full comfort care and hospice home eval pending ? ? ?Assessment and Plan: ?* Stroke (cerebrum) ([place]) ?Comfort care/ hospice ? ?Goals of care, counseling/discussion ?I had discussion with patient's daughter Keith Levy last several days and again this morning based on his worsening clinical condition.  She is in agreement to keep him full comfort care and pursue hospice home in [place] family convenience..  Waiting on hospice home confirmation ? ?Acute respiratory failure with hypoxia ([place]) ?Comfort care/ hospice ? ?COPD with acute exacerbation ([place]) ?Comfort care/ hospice ? ?Malnutrition of moderate degree ?Comfort care/ hospice ? ?Atherosclerosis of artery of extremity with rest pain ([place]) ?Comfort care/ hospice ? ?Tobacco use disorder ?Comfort care/ hospice ? ?Chronic alcohol abuse ?Comfort care/ hospice ? ?CKD (chronic kidney disease) stage 3, GFR 30-59 ml/min (HCC) ?Comfort care/ hospice ? ?Centrilobular emphysema ([place]) ?Comfort care/ hospice ? ?Bipolar 1 disorder ([place]) ?Comfort care/ hospice ? ? ? ? ?  ? ?Subjective: Seems agitated ? ?Physical Exam: ?Vitals:  ? 12/01/21 1100 12/01/21 1200 12/01/21 1950 12/02/21 0753  ?BP: (!) 134/118  (!) 149/103 (!) 189/111  ?Pulse:  70 93 80  ?Resp: (!) 26 (!) 28 (!) 29 18  ?Temp:   (!) 97.5 ?F (36.4 ?C) 97.7 ?F (36.5 ?C)  ?TempSrc:   Axillary Axillary  ?SpO2:  97% 99%   ?Weight:      ?Height:       ? ?81 year old male lying in the bed seem to be agitated ?Eyes: PERRL, lids and conjunctivae normal ?ENMT: Mucous membranes are moist. Posterior pharynx clear of any exudate or lesions.Normal dentition.  ?Neck: normal, supple, no masses, no thyromegaly ?Respiratory: no crackles.  Accessory muscles of respiration ?Cardiovascular: Regular rate and rhythm, no murmurs / rubs / gallops. No extremity edema ?Abdomen:  Soft, benign ?Skin: no rashes, lesions, ulcers. No induration ?Neurologic: Nonfocal ? ?Data Reviewed: ? ?There are no new results to review at this time. ? ?Family Communication: None ? ?Disposition: ?Status is: Inpatient ?Remains inpatient appropriate because: Waiting for hospice home placement ? ? Planned Discharge Destination:  Hospice home ? ? ? DVT prophylaxis/comfort care ?Time spent: 15 minutes ? ?Author: Max Sane, MD ?12/02/2021 10:55 AM ? ?For on call review [email or web address].  ?

## 2021-12-02 NOTE — Assessment & Plan Note (Signed)
I had discussion with patient's daughter [name] last several days and again this morning based on his worsening clinical condition.  She is in agreement to keep him full comfort care and pursue hospice home in [place] family convenience..  Waiting on hospice home confirmation ?

## 2021-12-02 NOTE — Progress Notes (Signed)
Manufacturing engineer [facility]) Hospital Liaison Note ? ?Received request from [employer] [place] for family interest in [facility]. Visited patient at bedside and spoke with daughter/Kristy to confirm interest and explain services. ? ?Approval for [employer] [facility] is determined by [facility] MD. Once [facility] MD has determined Hospice Home eligibility, [facility] will update hospital staff and family. Eligibility is pending ? ?Please do not hesitate to call with any hospice related questions.  ?  ?Thank you for the opportunity to participate in this patient's care. ? ?Daphene Calamity, MSW ?[place]  ?[phone] ? ?

## 2021-12-02 NOTE — Care Management Important Message (Signed)
Important Message ? ?Patient Details  ?Name: Keith Levy ?MRN: [id number] ?Date of Birth: 1941-04-24 ? ? ?Medicare Important Message Given:  Other (see comment) ? ?Patient is on comfort care with hospice. Out of respect for the patient and family no Important Message from [facility] given. ? ? ?Juliann Pulse A Hideo Googe ?12/02/2021, 8:09 AM ?

## 2021-12-02 NOTE — TOC Progression Note (Signed)
Transition of Care (TOC) - Progression Note  ? ? ?Patient Details  ?Name: Keith Levy ?MRN: [id number] ?Date of Birth: [birth year] ? ?Transition of Care (TOC) CM/SW Contact  ?Pete Pelt, RN ?Phone Number: ?12/02/2021, 10:07 AM ? ?Clinical Narrative:   Patient's daughter Keith Levy called.  She stated that she has not received update about patient's disposition.  Daughter would like patient to be transferred to hospice in [place], [place] to be closer to family.  She is unable to care for patient at home.  RNCM informed daughter that Fredric Mare will be contacted today and RNCM will follow up with daughter regarding status. ? ?RNCM spoke to Trellis, admission coordinator states that she will need additional clinical information faxed to her, as the clinical information faxed yesterday was not complete.  RNCM faxed Face sheet, progress notes from MD and Neurology sign off notes and MAR.  Trellis to contact RNCM following their  MD review. ? ? ? ?Expected Discharge Plan: [place] ?Barriers to Discharge: Continued Medical Work up ? ?Expected Discharge Plan and Services ?Expected Discharge Plan: [place] ?  ?Discharge Planning Services: CM Consult ?  ?Living arrangements for the past 2 months: [place] ?                ?  ?  ?  ?  ?  ?  ?  ?  ?  ?  ? ? ?Social Determinants of Health (SDOH) Interventions ?  ? ?Readmission Risk Interventions ?Readmission Risk Prevention Plan 11/28/2021  ?Transportation Screening Complete  ?PCP or Specialist Appt within 5-7 Days Complete  ?Home Care Screening Complete  ?Medication Review (RN CM) Complete  ?Some recent data might be hidden  ? ? ?

## 2021-12-03 DIAGNOSIS — J9601 Acute respiratory failure with hypoxia: Secondary | ICD-10-CM | POA: Diagnosis not present

## 2021-12-03 DIAGNOSIS — I70229 Atherosclerosis of native arteries of extremities with rest pain, unspecified extremity: Secondary | ICD-10-CM | POA: Diagnosis not present

## 2021-12-03 DIAGNOSIS — I639 Cerebral infarction, unspecified: Secondary | ICD-10-CM | POA: Diagnosis not present

## 2021-12-03 DIAGNOSIS — Z7189 Other specified counseling: Secondary | ICD-10-CM | POA: Diagnosis not present

## 2021-12-03 MED ORDER — MORPHINE SULFATE (CONCENTRATE) 10 MG /0.5 ML PO SOLN: 5.0000 mg | ORAL | 0 refills | Status: AC | PRN

## 2021-12-03 MED ORDER — LORAZEPAM 0.5 MG PO TABS: 0.5000 mg | ORAL_TABLET | Freq: Three times a day (TID) | ORAL | 0 refills | Status: AC | PRN

## 2021-12-03 NOTE — TOC Progression Note (Signed)
Transition of Care (TOC) - Progression Note  ? ? ?Patient Details  ?Name: Keith Levy ?MRN: [id number] ?Date of Birth: July 24, 1941 ? ?Transition of Care (TOC) CM/SW Contact  ?Pete Pelt, RN ?Phone Number: ?12/03/2021, 10:06 AM ? ?Clinical Narrative:   Patient can transfer to [facility] today as per [place].  [facility] notified family and will notify transport.   ? ? ? ?Expected Discharge Plan: [place] ?Barriers to Discharge: Continued Medical Work up ? ?Expected Discharge Plan and Services ?Expected Discharge Plan: [place] ?  ?Discharge Planning Services: CM Consult ?  ?Living arrangements for the past 2 months: [place] ?Expected Discharge Date: 12/03/21               ?  ?  ?  ?  ?  ?  ?  ?  ?  ?  ? ? ?Social Determinants of Health (SDOH) Interventions ?  ? ?Readmission Risk Interventions ?Readmission Risk Prevention Plan 11/28/2021  ?Transportation Screening Complete  ?PCP or Specialist Appt within 5-7 Days Complete  ?Home Care Screening Complete  ?Medication Review (RN CM) Complete  ?Some recent data might be hidden  ? ? ?

## 2021-12-03 NOTE — Progress Notes (Addendum)
[facility]  ? ?Patient has been approved for [facility]. [facility] in-patient unit located is [place], [place]. ? ?Consent forms to be completed. ? ?[facility] to be notified of patient D/C and transport arranged. TOC/Elena and Attending Physician/Dr. Manuella Ghazi also notified of transport arrangement.  ?  ?Please send signed DNR form with patient and RN call report to [phone].  ?  ?Daphene Calamity, MSW ?[place] [facility] Liaison ?[phone] ? ? ?

## 2021-12-03 NOTE — Discharge Summary (Signed)
?Physician Discharge Summary ?  ?Patient: Keith Levy MRN: [id number] DOB: 1941/08/05  ?Admit date:     11/27/2021  ?Discharge date: 12/03/21  ?Discharge Physician: Max Sane  ? ?PCP: Rusty Aus, MD  ? ?Recommendations at discharge:  ? ? Hospice ? ?Discharge Diagnoses: ?Principal Problem: ?  Stroke (cerebrum) ([place]) ?Active Problems: ?  Bipolar 1 disorder ([place]) ?  Centrilobular emphysema ([place]) ?  CKD (chronic kidney disease) stage 3, GFR 30-59 ml/min (HCC) ?  Chronic alcohol abuse ?  Tobacco use disorder ?  Atherosclerosis of artery of extremity with rest pain ([place]) ?  Malnutrition of moderate degree ?  COPD with acute exacerbation ([place]) ?  Acute respiratory failure with hypoxia ([place]) ?  Goals of care, counseling/discussion ? ?Hospital Course: ?81 y.o. male with medical history significant of PAD, chronic A-fib on Eliquis, CKD stage IIIa, HLD, question of COPD, bipolar disorder, cigar smoker, alcohol abuse, came with frequent falls and loss of balance. ? ?3/16 -PT, OT, ST and neuro consult.  Getting CTA head and neck ?3/17: SNF with palliative care at discharge ?3/18: Alcohol withdrawal, getting CT head and MRI brain for further eval ?3/19-3/20: Full comfort care and hospice home eval pending ? ?Assessment and Plan: ?* Stroke (cerebrum) ([place]) ?Goals of care, counseling/discussion/Hospice care ?Acute respiratory failure with hypoxia ([place]) ?COPD with acute exacerbation ([place]) ?Malnutrition of moderate degree ?Atherosclerosis of artery of extremity with rest pain ([place]) ?Tobacco use disorder ?Chronic alcohol abuse ?CKD (chronic kidney disease) stage 3a, GFR 30-59 ml/min (HCC) ?Centrilobular emphysema ([place]) ?Bipolar 1 disorder ([place]) ? ?Prognosis < 2 weeks. He's actively dying. ? ?  ? ? ?Consultants: Neuro, PCCM ?Disposition: Hospice care ?Diet recommendation:  ?Discharge Diet Orders (From admission, onward)  ? ?  Start     Ordered  ? 12/03/21 0000  Diet - low sodium heart healthy       ? 12/03/21 1941  ? ?  ?  ? ?   ? ?Dysphagia type 3 thickended Liquid ?DISCHARGE MEDICATION: ?Allergies as of 12/03/2021   ?No Known Allergies ?  ? ?  ?Medication List  ?  ? ?STOP taking these medications   ? ?amoxicillin-clavulanate 500-125 MG tablet ?Commonly known as: AUGMENTIN ?  ?aspirin 81 MG EC tablet ?  ?clopidogrel 75 MG tablet ?Commonly known as: PLAVIX ?  ?Eliquis 2.5 MG Tabs tablet ?Generic drug: apixaban ?  ?feeding supplement Liqd ?  ?furosemide 20 MG tablet ?Commonly known as: LASIX ?  ?lithium carbonate 300 MG capsule ?  ?multivitamin with minerals Tabs tablet ?  ?oxyCODONE-acetaminophen 5-325 MG tablet ?Commonly known as: PERCOCET/ROXICET ?  ?PARoxetine 20 MG tablet ?Commonly known as: PAXIL ?  ?QUEtiapine 200 MG tablet ?Commonly known as: SEROQUEL ?  ?QUEtiapine 50 MG tablet ?Commonly known as: SEROQUEL ?  ?simvastatin 20 MG tablet ?Commonly known as: ZOCOR ?  ? ?  ? ?TAKE these medications   ? ?LORazepam 0.5 MG tablet ?Commonly known as: Ativan ?Take 1 tablet (0.5 mg total) by mouth every 8 (eight) hours as needed for anxiety. ?  ?morphine CONCENTRATE 10 mg / 0.5 ml concentrated solution ?Take 0.25 mLs (5 mg total) by mouth every 2 (two) hours as needed for severe pain. ?  ? ?  ? ? ?Discharge Exam: ?Filed Weights  ? 11/27/21 1710 11/30/21 1720  ?Weight: 83.5 kg 84.27 kg  ? ?81 year old male lying in the bed seem to be actively dying ?Eyes: PERRL, lids and conjunctivae normal ?ENMT: Mucous membranes are moist. Posterior pharynx clear of any  exudate or lesions.Normal dentition.  ?Neck: normal, supple, no masses, no thyromegaly ?Respiratory: no crackles.  Accessory muscles of respiration ?Cardiovascular: Regular rate and rhythm, no murmurs / rubs / gallops. No extremity edema ?Abdomen:  Soft, benign ?Skin: no rashes, lesions, ulcers. No induration ?Neurologic: Stuporous ? ?Condition at discharge: serious ? ?The results of significant diagnostics from this hospitalization (including imaging, microbiology, ancillary and laboratory)  are listed below for reference.  ? ?Imaging Studies: ?CT ANGIO HEAD NECK W WO CM ? ?Result Date: 11/28/2021 ?CLINICAL DATA:  Neuro deficit, acute, stroke suspected EXAM: CT ANGIOGRAPHY HEAD AND NECK TECHNIQUE: Multidetector CT imaging of the head and neck was performed using the standard protocol during bolus administration of intravenous contrast. Multiplanar CT image reconstructions and MIPs were obtained to evaluate the vascular anatomy. Carotid stenosis measurements (when applicable) are obtained utilizing NASCET criteria, using the distal internal carotid diameter as the denominator. RADIATION DOSE REDUCTION: This exam was performed according to the departmental dose-optimization program which includes automated exposure control, adjustment of the mA and/or kV according to patient size and/or use of iterative reconstruction technique. CONTRAST:  19m OMNIPAQUE IOHEXOL 350 MG/ML SOLN COMPARISON:  CT head and MRI/MRA from November 27, 2021. FINDINGS: CT HEAD FINDINGS Brain: No substantial change in appearance of right MCA territory acute/subacute infarcts with associated edema. No substantial mass effect. No midline shift. No evidence of acute mass occupying hemorrhage. No hydrocephalus. Redemonstrated left tentorial meningioma. Vascular: See below. Skull: No acute fracture. Sinuses: Air-fluid level in the right sphenoid sinus. Orbits: No acute finding. Review of the MIP images confirms the above findings CTA NECK FINDINGS Aortic arch: Aortic atherosclerosis. Approximately 60% stenosis of the common carotid artery origin due to atherosclerosis. Great vessel origins are patent. Right carotid system: Predominantly calcific atherosclerosis at the carotid bifurcation with approximately 50% stenosis of the ICA origin. Left carotid system: Approximately 60% stenosis of the common carotid artery origin. Predominately calcific atherosclerosis at the carotid bifurcation with approximately 40% stenosis of the ICA origin.  Vertebral arteries: Occlusion of the right vertebral artery as origin with remainder of the right vertebral artery the non-opacified in the neck. Left vertebral artery is patent with severe narrowing at its origin. Skeleton: Moderate multilevel degenerative disc disease. Other neck: No evidence of acute abnormality on limited assessment. Upper chest: Visualized lung apices are clear.  Emphysema. Review of the MIP images confirms the above findings CTA HEAD FINDINGS Anterior circulation: Calcific atherosclerosis of bilateral intracranial ICAs with moderate bilateral paraclinoid ICA stenosis. Bilateral M1 MCAs are patent without significant stenosis proximal M2 MCA branches are patent bilaterally. Attenuated right MCA vessels distally in the region of known infarct. Small left A1 ACA, probably congenital given prominent right A1 ACA and partially azygous ACA, anatomic variant. Posterior circulation: Some opacification of the small intradural right vertebral artery likely from collateral flow. Moderate to severe stenosis of the left intradural vertebral artery due to atherosclerosis. Basilar artery is patent without significant stenosis. Bilateral posterior cerebral arteries are patent without proximal hemodynamically significant stenosis. No aneurysm identified. Venous sinuses: As permitted by contrast timing, patent. Anatomic variants: Detailed above. Review of the MIP images confirms the above findings IMPRESSION: CTA head: 1. No large vessel occlusion intracranially. Attenuated right MCA branch vessels distally in the region of known infarct. 2. Moderate bilateral paraclinoid ICA stenosis. 3. Moderate to severe stenosis of the dominant intradural left vertebral artery. 4. Some opacification of the small intradural right vertebral artery likely from collateral flow given occlusion in the neck, detailed  below. CTA neck: 1. Occluded right vertebral artery origin with non-opacification in the neck. 2. Severe stenosis  of the left vertebral artery origin. 3. Approximately 60% stenosis of the left common carotid artery origin. 4. Bilateral carotid bifurcation atherosclerosis with approximately 50% right and 40% left ICA orig

## 2021-12-03 NOTE — Progress Notes (Signed)
?   11/30/21 1538  ?Assess: MEWS Score  ?Temp 98.3 ?F (36.8 ?C)  ?BP (!) 166/105  ?Pulse Rate (!) 53  ?Resp (!) 26  ?SpO2 (!) 89 %  ?O2 Device Nasal Cannula  ?Assess: MEWS Score  ?MEWS Temp 0  ?MEWS Systolic 0  ?MEWS Pulse 0  ?MEWS RR 2  ?MEWS LOC 0  ?MEWS Score 2  ?MEWS Score Color Yellow  ?Assess: if the MEWS score is Yellow or Red  ?Were vital signs taken at a resting state? Yes  ?Focused Assessment Change from prior assessment (see assessment flowsheet)  ?Does the patient meet 2 or more of the SIRS criteria? No  ?MEWS guidelines implemented *See Row Information* Yes  ?Treat  ?Pain Scale 0-10  ?Pain Score 0  ?Escalate  ?MEWS: Escalate Yellow: discuss with charge nurse/RN and consider discussing with provider and RRT  ?Notify: Charge Nurse/RN  ?Name of Charge Nurse/RN Notified Estill Bamberg RN  ?Date Charge Nurse/RN Notified 11/30/21  ?Time Charge Nurse/RN Notified 1538  ?Notify: Provider  ?Provider Name/Title Dr Manuella Ghazi MD  ?Date Provider Notified 11/30/21  ?Time Provider Notified [phone]  ?Notification Type Page  ?Notification Reason Change in status ?(Patient declining throughout day, attending MD and neurology MD made aware around 1200 when change occured. Discussion of sending pt to ICU)  ?Document  ?Patient Outcome Transferred/level of care increased ?(Patient transferred to ICU after discussion with multiple MDs and family member)  ?Progress note created (see row info) Yes  ?Assess: SIRS CRITERIA  ?SIRS Temperature  0  ?SIRS Pulse 0  ?SIRS Respirations  1  ?SIRS WBC 0  ?SIRS Score Sum  1  ? ? ?

## 2021-12-03 NOTE — Progress Notes (Signed)
Report given to [facility] ?

## 2021-12-14 DEATH — deceased

## 2022-01-27 ENCOUNTER — Other Ambulatory Visit (INDEPENDENT_AMBULATORY_CARE_PROVIDER_SITE_OTHER): Payer: Self-pay | Admitting: Vascular Surgery

## 2022-01-28 ENCOUNTER — Encounter (INDEPENDENT_AMBULATORY_CARE_PROVIDER_SITE_OTHER): Payer: Medicare Other

## 2022-01-28 ENCOUNTER — Ambulatory Visit (INDEPENDENT_AMBULATORY_CARE_PROVIDER_SITE_OTHER): Payer: Medicare Other | Admitting: Vascular Surgery
